# Patient Record
Sex: Female | Born: 1985 | Race: White | Hispanic: No | Marital: Single | Smoking: Current every day smoker
Health system: Southern US, Community
[De-identification: ages and names within clinical notes are randomized; demographics above are authoritative.]

## PROBLEM LIST (undated history)

## (undated) DIAGNOSIS — F1120 Opioid dependence, uncomplicated: Secondary | ICD-10-CM

## (undated) DIAGNOSIS — F1920 Other psychoactive substance dependence, uncomplicated: Secondary | ICD-10-CM

## (undated) DIAGNOSIS — K219 Gastro-esophageal reflux disease without esophagitis: Secondary | ICD-10-CM

## (undated) DIAGNOSIS — F32A Depression, unspecified: Secondary | ICD-10-CM

## (undated) DIAGNOSIS — F319 Bipolar disorder, unspecified: Secondary | ICD-10-CM

## (undated) DIAGNOSIS — I4891 Unspecified atrial fibrillation: Secondary | ICD-10-CM

## (undated) DIAGNOSIS — F329 Major depressive disorder, single episode, unspecified: Secondary | ICD-10-CM

## (undated) DIAGNOSIS — Z5320 Procedure and treatment not carried out because of patient's decision for unspecified reasons: Secondary | ICD-10-CM

## (undated) HISTORY — DX: Gastro-esophageal reflux disease without esophagitis: K21.9

## (undated) HISTORY — DX: Depression, unspecified: F32.A

## (undated) HISTORY — DX: Major depressive disorder, single episode, unspecified: F32.9

## (undated) HISTORY — DX: Unspecified atrial fibrillation: I48.91

## (undated) HISTORY — PX: OTHER SURGICAL HISTORY: SHX169

---
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
## 2006-07-19 ENCOUNTER — Emergency Department (HOSPITAL_COMMUNITY): Admission: EM | Admit: 2006-07-19 | Discharge: 2006-07-19 | Payer: Self-pay | Admitting: Emergency Medicine

## 2008-02-14 ENCOUNTER — Emergency Department (HOSPITAL_COMMUNITY): Admission: EM | Admit: 2008-02-14 | Discharge: 2008-02-14 | Payer: Self-pay | Admitting: Emergency Medicine

## 2008-03-12 ENCOUNTER — Ambulatory Visit: Payer: Self-pay | Admitting: Internal Medicine

## 2008-03-12 DIAGNOSIS — J45909 Unspecified asthma, uncomplicated: Secondary | ICD-10-CM

## 2008-03-12 DIAGNOSIS — R059 Cough, unspecified: Secondary | ICD-10-CM | POA: Insufficient documentation

## 2008-03-12 DIAGNOSIS — R05 Cough: Secondary | ICD-10-CM | POA: Insufficient documentation

## 2008-03-25 ENCOUNTER — Ambulatory Visit: Payer: Self-pay | Admitting: Cardiovascular Disease

## 2008-03-29 ENCOUNTER — Ambulatory Visit: Payer: Self-pay | Admitting: Internal Medicine

## 2008-03-29 ENCOUNTER — Encounter: Payer: Self-pay | Admitting: Internal Medicine

## 2008-03-29 LAB — CONVERTED CEMR LAB
BUN: 12 mg/dL (ref 6–23)
Basophils Absolute: 0.1 10*3/uL (ref 0.0–0.1)
Calcium: 8.9 mg/dL (ref 8.4–10.5)
Chloride: 108 meq/L (ref 96–112)
Eosinophils Absolute: 0.4 10*3/uL (ref 0.0–0.7)
GFR calc Af Amer: 116 mL/min
GFR calc non Af Amer: 96 mL/min
Hemoglobin: 14.4 g/dL (ref 12.0–15.0)
Magnesium: 2 mg/dL (ref 1.5–2.5)
Monocytes Absolute: 1.1 10*3/uL — ABNORMAL HIGH (ref 0.1–1.0)
Neutrophils Relative %: 62.9 % (ref 43.0–77.0)
Potassium: 3.9 meq/L (ref 3.5–5.1)
RDW: 12.7 % (ref 11.5–14.6)
Sodium: 142 meq/L (ref 135–145)

## 2008-03-30 ENCOUNTER — Ambulatory Visit: Payer: Self-pay

## 2008-04-06 ENCOUNTER — Ambulatory Visit: Payer: Self-pay | Admitting: Cardiovascular Disease

## 2008-04-06 ENCOUNTER — Inpatient Hospital Stay (HOSPITAL_COMMUNITY): Admission: EM | Admit: 2008-04-06 | Discharge: 2008-04-07 | Payer: Self-pay | Admitting: Emergency Medicine

## 2008-04-18 ENCOUNTER — Ambulatory Visit: Payer: Self-pay | Admitting: *Deleted

## 2008-04-18 ENCOUNTER — Inpatient Hospital Stay (HOSPITAL_COMMUNITY): Admission: EM | Admit: 2008-04-18 | Discharge: 2008-04-20 | Payer: Self-pay | Admitting: *Deleted

## 2008-04-26 ENCOUNTER — Ambulatory Visit: Payer: Self-pay | Admitting: Internal Medicine

## 2008-05-17 DIAGNOSIS — I429 Cardiomyopathy, unspecified: Secondary | ICD-10-CM | POA: Insufficient documentation

## 2008-05-17 DIAGNOSIS — I4891 Unspecified atrial fibrillation: Secondary | ICD-10-CM

## 2008-10-17 ENCOUNTER — Encounter: Payer: Self-pay | Admitting: Internal Medicine

## 2008-10-18 ENCOUNTER — Encounter: Payer: Self-pay | Admitting: Internal Medicine

## 2008-11-05 ENCOUNTER — Emergency Department (HOSPITAL_COMMUNITY): Admission: EM | Admit: 2008-11-05 | Discharge: 2008-11-05 | Payer: Self-pay | Admitting: Family Medicine

## 2008-11-26 ENCOUNTER — Emergency Department (HOSPITAL_COMMUNITY): Admission: EM | Admit: 2008-11-26 | Discharge: 2008-11-26 | Payer: Self-pay | Admitting: Emergency Medicine

## 2008-11-30 ENCOUNTER — Ambulatory Visit: Payer: Self-pay | Admitting: Internal Medicine

## 2008-11-30 ENCOUNTER — Emergency Department (HOSPITAL_COMMUNITY): Admission: EM | Admit: 2008-11-30 | Discharge: 2008-11-30 | Payer: Self-pay | Admitting: Emergency Medicine

## 2008-11-30 ENCOUNTER — Ambulatory Visit: Payer: Self-pay | Admitting: Cardiology

## 2008-11-30 DIAGNOSIS — R55 Syncope and collapse: Secondary | ICD-10-CM | POA: Insufficient documentation

## 2008-12-01 ENCOUNTER — Ambulatory Visit: Payer: Self-pay | Admitting: Internal Medicine

## 2008-12-01 ENCOUNTER — Inpatient Hospital Stay (HOSPITAL_COMMUNITY): Admission: AD | Admit: 2008-12-01 | Discharge: 2008-12-04 | Payer: Self-pay | Admitting: Internal Medicine

## 2008-12-02 ENCOUNTER — Encounter: Payer: Self-pay | Admitting: Internal Medicine

## 2008-12-04 ENCOUNTER — Telehealth: Payer: Self-pay | Admitting: Nurse Practitioner

## 2008-12-04 ENCOUNTER — Emergency Department (HOSPITAL_COMMUNITY): Admission: EM | Admit: 2008-12-04 | Discharge: 2008-12-05 | Payer: Self-pay | Admitting: Emergency Medicine

## 2008-12-09 ENCOUNTER — Encounter: Payer: Self-pay | Admitting: Internal Medicine

## 2008-12-12 ENCOUNTER — Emergency Department: Payer: Self-pay | Admitting: Internal Medicine

## 2008-12-22 ENCOUNTER — Ambulatory Visit: Payer: Self-pay | Admitting: Cardiology

## 2008-12-22 ENCOUNTER — Encounter (INDEPENDENT_AMBULATORY_CARE_PROVIDER_SITE_OTHER): Payer: Self-pay | Admitting: *Deleted

## 2008-12-22 ENCOUNTER — Telehealth (INDEPENDENT_AMBULATORY_CARE_PROVIDER_SITE_OTHER): Payer: Self-pay | Admitting: *Deleted

## 2009-01-01 ENCOUNTER — Inpatient Hospital Stay (HOSPITAL_COMMUNITY): Admission: AD | Admit: 2009-01-01 | Discharge: 2009-01-02 | Payer: Self-pay | Admitting: Obstetrics & Gynecology

## 2009-01-03 ENCOUNTER — Emergency Department: Payer: Self-pay | Admitting: Emergency Medicine

## 2009-01-06 ENCOUNTER — Telehealth: Payer: Self-pay | Admitting: Cardiology

## 2009-01-06 ENCOUNTER — Encounter: Payer: Self-pay | Admitting: Internal Medicine

## 2009-01-06 ENCOUNTER — Telehealth: Payer: Self-pay | Admitting: Internal Medicine

## 2009-01-08 ENCOUNTER — Emergency Department: Payer: Self-pay | Admitting: Emergency Medicine

## 2009-01-13 ENCOUNTER — Telehealth: Payer: Self-pay | Admitting: Internal Medicine

## 2009-01-18 ENCOUNTER — Ambulatory Visit: Payer: Self-pay | Admitting: Internal Medicine

## 2009-01-19 ENCOUNTER — Telehealth (INDEPENDENT_AMBULATORY_CARE_PROVIDER_SITE_OTHER): Payer: Self-pay | Admitting: *Deleted

## 2009-01-19 LAB — CONVERTED CEMR LAB: Digitoxin Lvl: 0.4 ng/mL — ABNORMAL LOW (ref 0.8–2.0)

## 2009-03-02 ENCOUNTER — Encounter (INDEPENDENT_AMBULATORY_CARE_PROVIDER_SITE_OTHER): Payer: Self-pay | Admitting: *Deleted

## 2009-04-06 ENCOUNTER — Inpatient Hospital Stay (HOSPITAL_COMMUNITY): Admission: AD | Admit: 2009-04-06 | Discharge: 2009-04-06 | Payer: Self-pay | Admitting: Obstetrics & Gynecology

## 2009-05-04 ENCOUNTER — Encounter (INDEPENDENT_AMBULATORY_CARE_PROVIDER_SITE_OTHER): Payer: Self-pay | Admitting: *Deleted

## 2009-05-18 IMAGING — CR DG CHEST 1V PORT
1 series · 1 of 1 positions shown · non-contrast
Comparison: 03/12/2008

CLINICAL DATA: Chest pain

PORTABLE CHEST - 1 VIEW

[view not recorded]
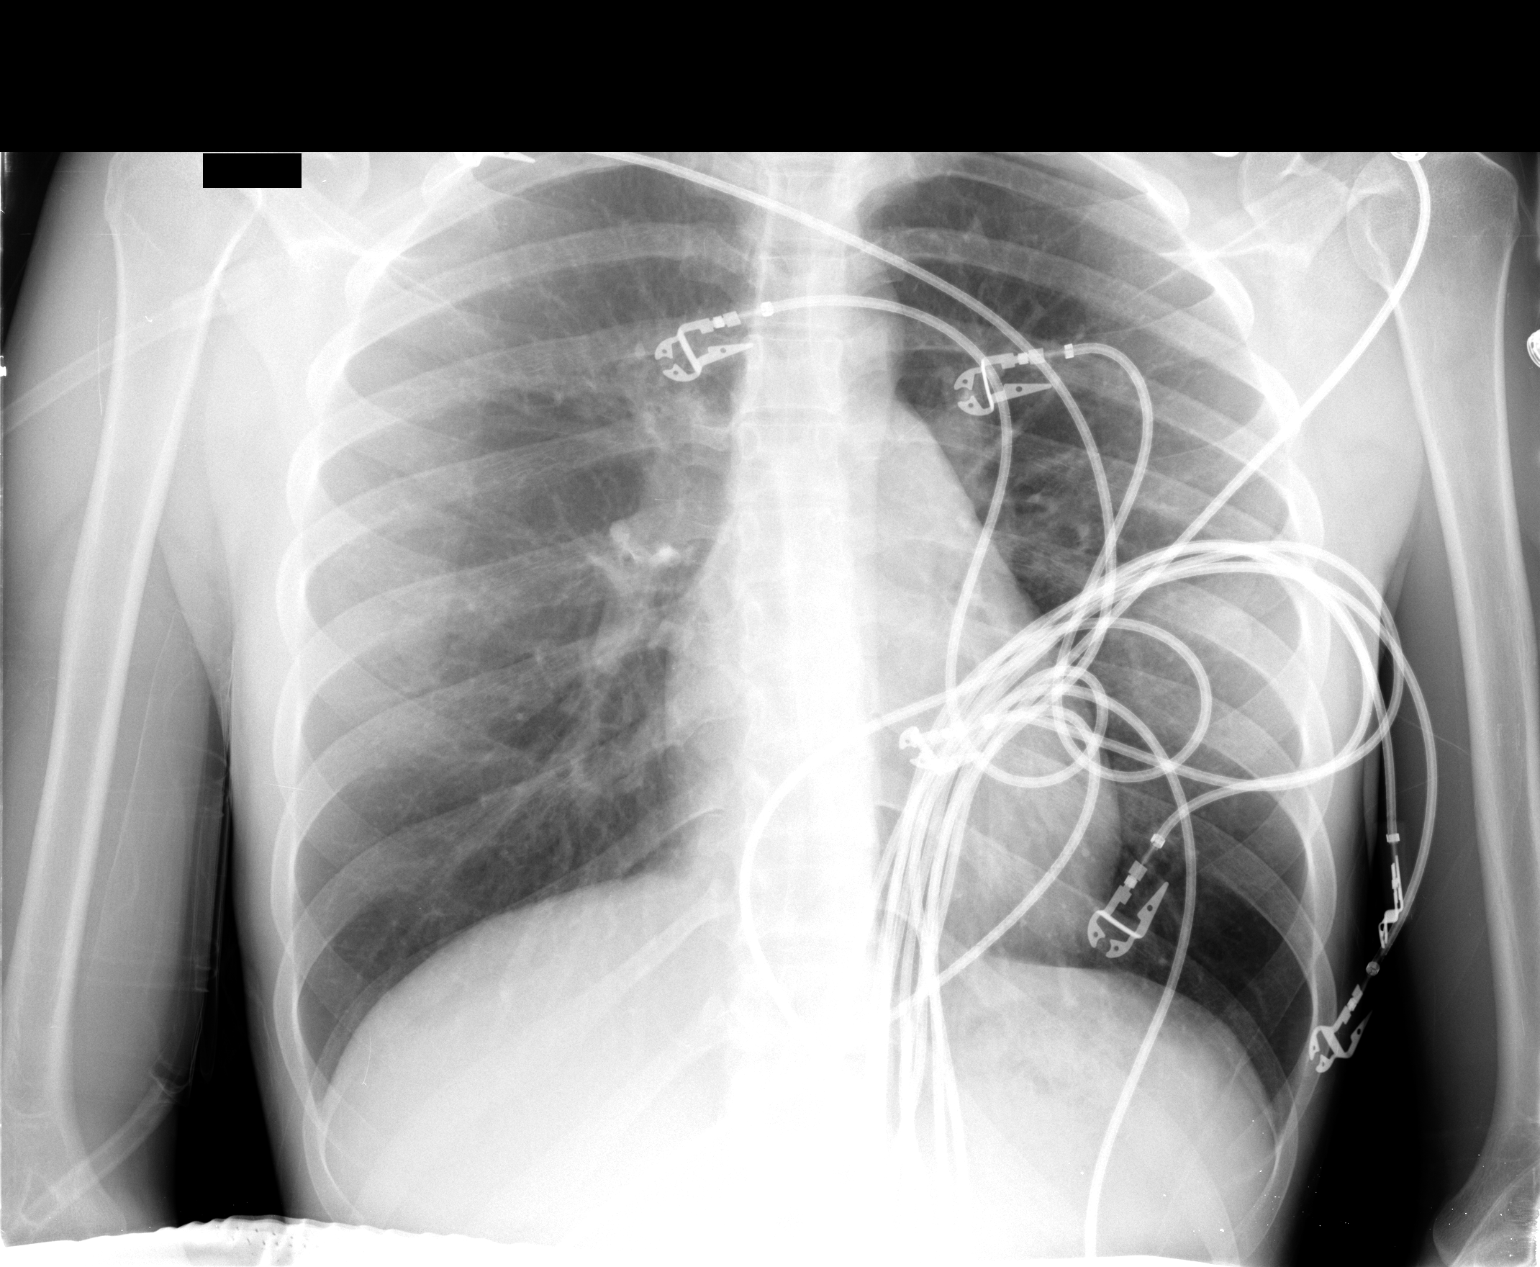

[1 of 1 positions shown; findings below may reference images not displayed]

FINDINGS: Heart and mediastinal contours normal.  There is
peribronchial thickening and hyperaeration of the lungs as before.
No focal airspace disease or pleural fluid in one-view.
IMPRESSION: Peribronchial thickening and hyperaeration - no evidence for
pneumonia or interval change.

## 2009-07-20 ENCOUNTER — Ambulatory Visit: Payer: Self-pay | Admitting: Cardiovascular Disease

## 2009-07-20 DIAGNOSIS — I519 Heart disease, unspecified: Secondary | ICD-10-CM | POA: Insufficient documentation

## 2009-07-21 LAB — CONVERTED CEMR LAB
Basophils Relative: 1 % (ref 0–1)
Eosinophils Absolute: 0.3 10*3/uL (ref 0.0–0.7)
HCT: 40.7 % (ref 36.0–46.0)
Lymphocytes Relative: 33 % (ref 12–46)
Monocytes Absolute: 0.7 10*3/uL (ref 0.1–1.0)
Neutro Abs: 4 10*3/uL (ref 1.7–7.7)
Preg, Serum: NEGATIVE
RBC: 4.65 M/uL (ref 3.87–5.11)
WBC: 7.6 10*3/uL (ref 4.0–10.5)

## 2009-07-22 ENCOUNTER — Encounter: Payer: Self-pay | Admitting: Cardiovascular Disease

## 2009-07-27 ENCOUNTER — Encounter: Payer: Self-pay | Admitting: Cardiovascular Disease

## 2009-07-27 ENCOUNTER — Telehealth: Payer: Self-pay | Admitting: Cardiovascular Disease

## 2009-09-25 ENCOUNTER — Emergency Department (HOSPITAL_COMMUNITY): Admission: EM | Admit: 2009-09-25 | Discharge: 2009-09-25 | Payer: Self-pay | Admitting: Emergency Medicine

## 2009-09-30 ENCOUNTER — Emergency Department (HOSPITAL_COMMUNITY): Admission: EM | Admit: 2009-09-30 | Discharge: 2009-09-30 | Payer: Self-pay | Admitting: Emergency Medicine

## 2010-01-23 IMAGING — US US OB US >=[ID] SNGL FETUS
1 series · 17 of 28 positions shown · non-contrast
Comparison: none

REASON FOR EXAM: Pain
COMMENTS:

PROCEDURE:     US  - US OB GREATER/OR EQUAL TO [id number]  - December 12, 2008  [DATE]
RESULT:     Comparison: None
INDICATION: Pain. LMP 08/24/2008
TECHNIQUE: Multiple transabdominal gray-scale images of the pelvis
performed.

[Series 1: us ob us >=(id) sngl fetus · 17 of 41 slices shown]
[im 1/41]
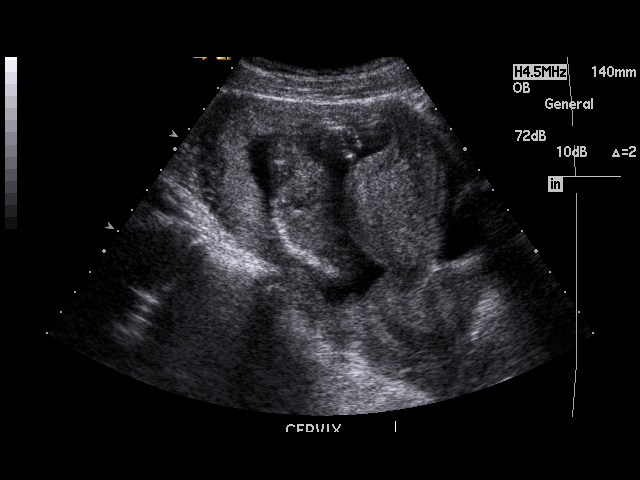
[im 3/41]
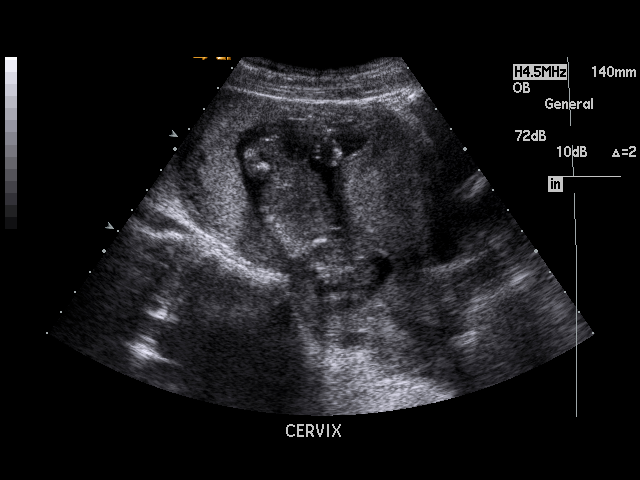
[im 6/41]
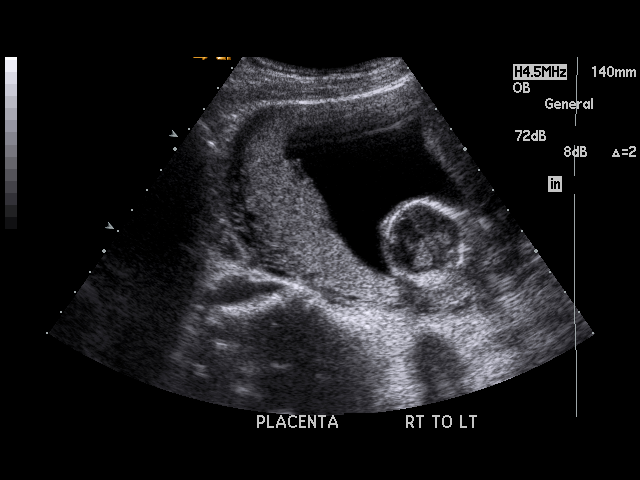
[im 8/41]
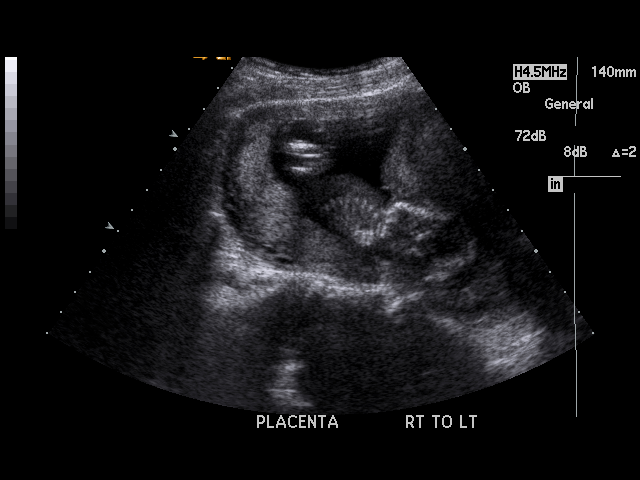
[im 11/41]
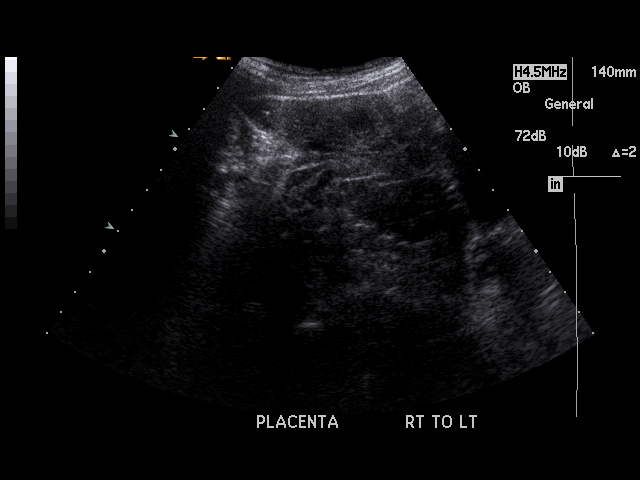
[im 14/41]
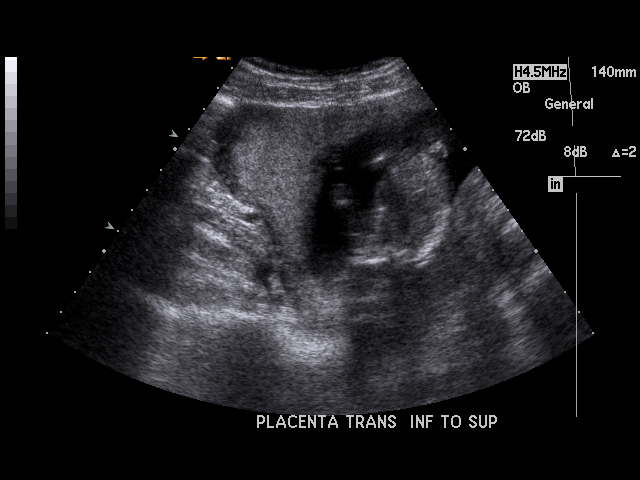
[im 15/41]
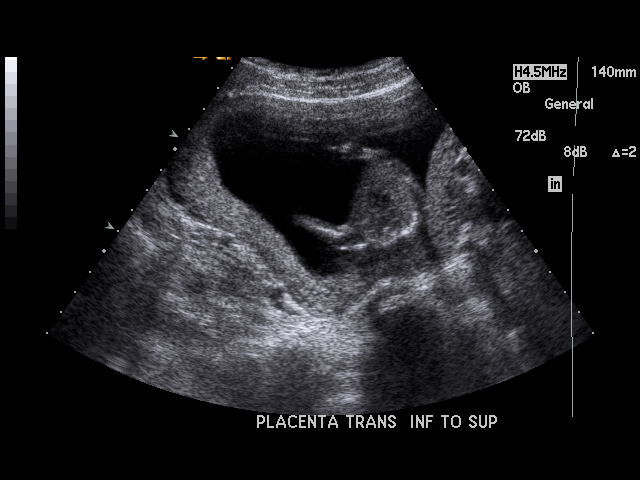
[im 18/41]
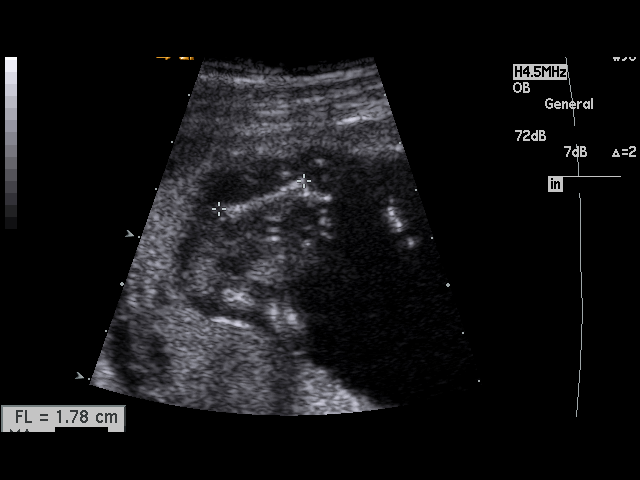
[im 21/41]
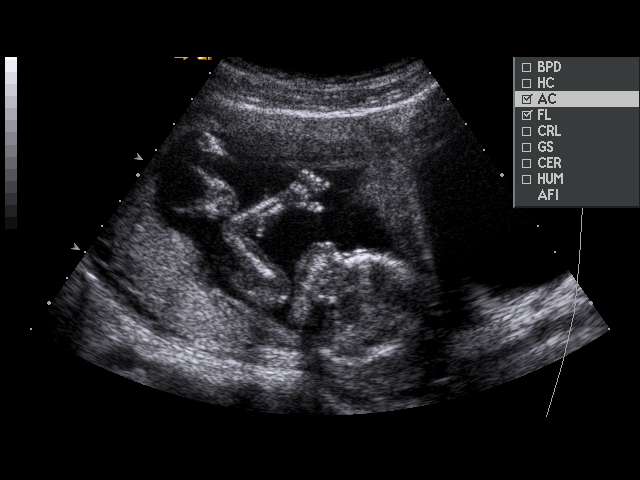
[im 23/41]
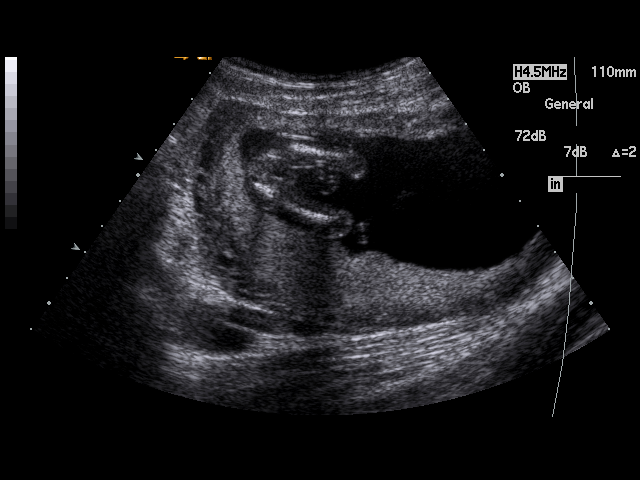
[im 26/41]
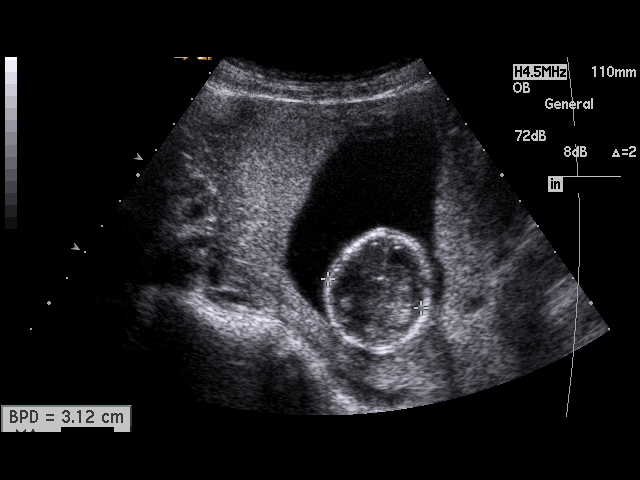
[im 27/41]
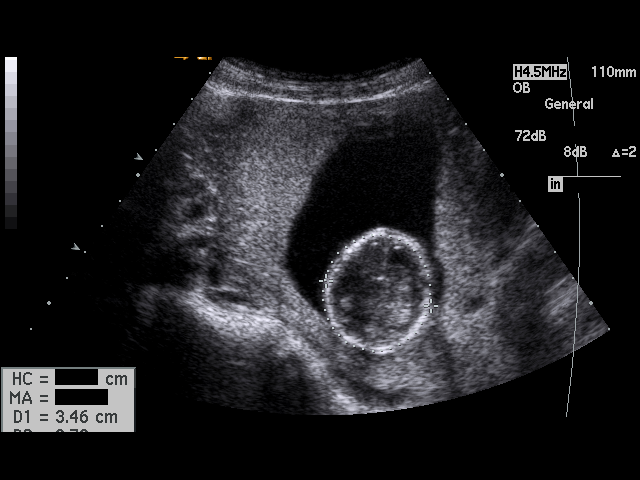
[im 30/41]
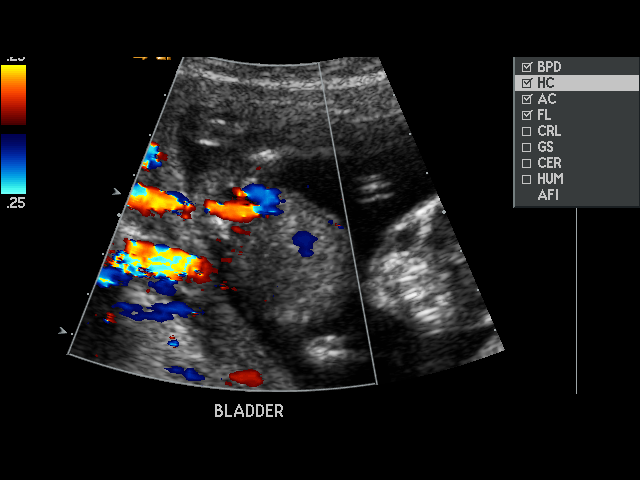
[im 33/41]
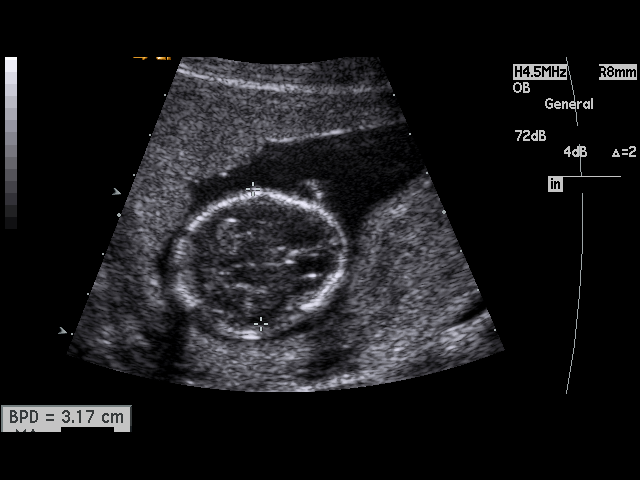
[im 35/41]
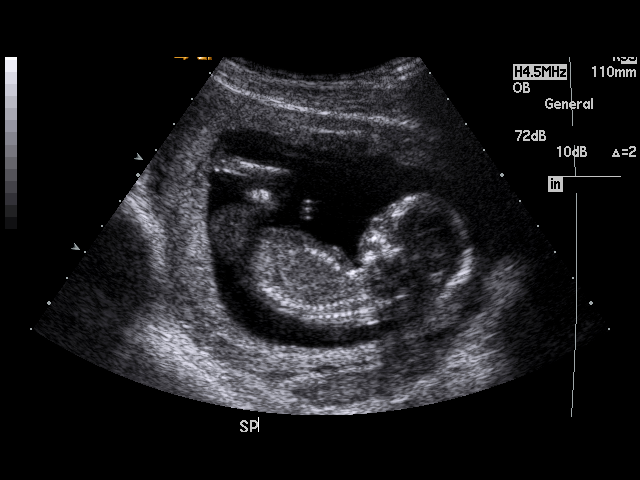
[im 38/41]
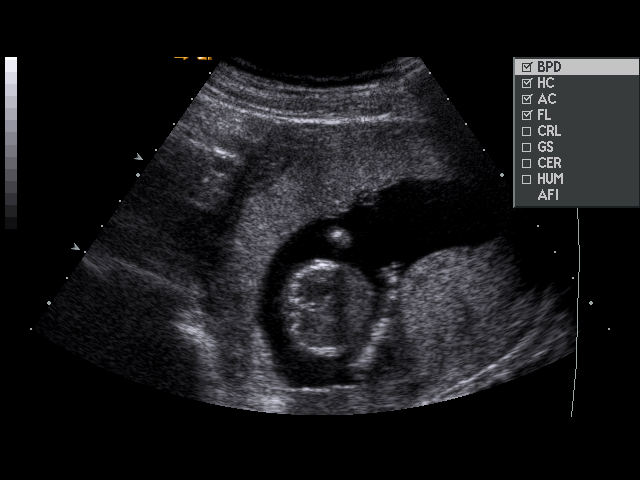
[im 41/41]
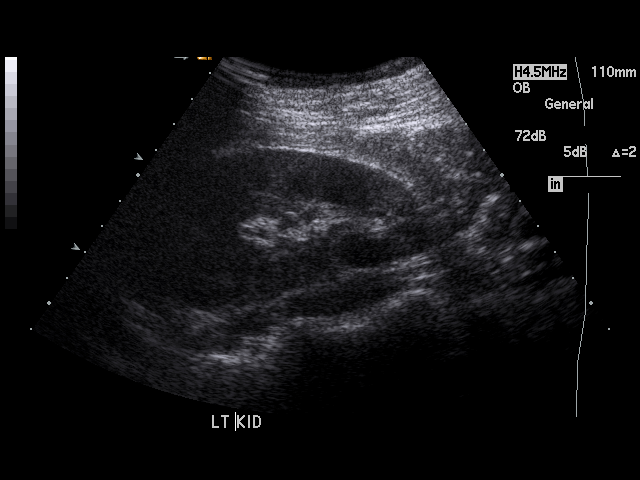

[17 of 28 positions shown; findings below may reference images not displayed]

FINDINGS: There is a single live intrauterine pregnancy dating 15 weeks 5 days with an
estimated due date of 05/31/2009. There is a normal fetal heart rate of 171
beats per minute. The placenta is posteriorly located and normal. The
placenta is grade 0. There is no placental abruption. The cervix is closed
and normal in length measuring 3.9 cm.

There is no adnexal mass. The right ovary is not visualized.

There is no pelvic free fluid.
IMPRESSION: Single live intrauterine pregnancy dating 15 weeks 5 days with a normal
fetal heart rate of 171 beats per minute.

## 2010-03-31 ENCOUNTER — Telehealth: Payer: Self-pay | Admitting: Internal Medicine

## 2010-04-03 ENCOUNTER — Telehealth: Payer: Self-pay | Admitting: Internal Medicine

## 2010-04-05 ENCOUNTER — Telehealth: Payer: Self-pay | Admitting: Internal Medicine

## 2010-04-25 ENCOUNTER — Telehealth (INDEPENDENT_AMBULATORY_CARE_PROVIDER_SITE_OTHER): Payer: Self-pay | Admitting: *Deleted

## 2010-06-20 ENCOUNTER — Ambulatory Visit: Admit: 2010-06-20 | Payer: Self-pay | Admitting: Internal Medicine

## 2010-07-06 NOTE — Letter (Signed)
Summary: Clearance Letter  Architectural technologist at [address]   [place], [place] [address]   Phone: [phone]  Fax: [phone]    July 22, 2009  Re:     [name] Address:   [address]     [place], [place]  [address] DOB:     [birth year] MRN:     [id number]   Dear Ms. Pumphrey-  You have been cleared from a cardiac standpoint to proceed with surgery.  You are scheduled to have a repeat EKG on 07/27/2009 to follow medication changes made at your last office visit.  Should you have any questions or need additional information, please feel free to contact our office at [phone].     Sincerely,     Dossie Arbour, MD, PhD Charlena Cross, RN, BSN

## 2010-07-06 NOTE — Progress Notes (Signed)
Summary: pt needs refill  Phone Note Refill Request Call back at [phone] Message from:  Patient on [facility] / [phone]  Refills Requested: Medication #1:  SOTALOL HCL 120 MG TABS 1/2  tablet by mouth twice a day Initial call taken by: Omer Jack,  March 31, 2010 3:59 PM  Follow-up for Phone Call        Called pharmacy and authorized refill for Sotalol in [facility].  Left msg on pts vcml to let her know it has been called in Follow-up by: Marrion Coy, CNA,  March 31, 2010 4:03 PM

## 2010-07-06 NOTE — Progress Notes (Signed)
  Phone Note Other Incoming   Request: Send information Summary of Call: Request received from [facility] forwarded to [facility].

## 2010-07-06 NOTE — Progress Notes (Signed)
Summary: SURGICAL CLEARANCE  Phone Note Call from Patient Call back at Home Phone [phone]   Caller: SELF Call For: [facility] Summary of Call: NEEDS A LETTER OF SURGICAL CLEARANCE FAXED TO # [phone] STATING THAT THE RETURN EKG WAS NOT RELATED TO HER SURGICAL CLEARANCE, THAT IT WAS ONLY RELATED TO HER MEDICATION CHANGE  Initial call taken by: Harlon Flor,  July 27, 2009 2:50 PM  Follow-up for Phone Call        done.  Follow-up by: Charlena Cross, RN, BSN,  July 27, 2009 4:30 PM

## 2010-07-06 NOTE — Progress Notes (Signed)
Summary: refill request  Phone Note Refill Request Message from:  Patient on April 03, 2010 11:16 AM  Refills Requested: Medication #1:  METOPROLOL TARTRATE 25 MG TABS 1/2  tablet by mouth twice a day [facility] [phone] [place]   Method Requested: Telephone to Pharmacy Initial call taken by: Glynda Jaeger,  April 03, 2010 11:17 AM  Follow-up for Phone Call        Medication has been called in but pt profile at pharmacy is showing a different formulation of Metoprolol written by Dr. August Saucer at [facility].  Informed pharmacy I could only authorize what we discharged pt on in FEB and if she has a more recent Rx she wll need to contact Dr. August Saucer.   Follow-up by: Judithe Modest CMA,  April 03, 2010 11:48 AM

## 2010-07-06 NOTE — Letter (Signed)
Summary: Clearance Letter  Architectural technologist at [address]   [place], [place] [address]   Phone: [phone]  Fax: [phone]    July 27, 2009  Re:     [name] Address:   [id number]     [place], [place]  [address] DOB:     06-24-1985 MRN:     [id number]   Dear Dr. Melvenia Beam-  [name] is clear from a cardiology standpoint to proceed with surgery without a follow-up EKG.  The EKG was requested in relation to medication changes that were made at her last office visit.  Should you need additional information or assistance in this matter, please feel free to contact our office @ [phone].   Sincerely,     Dossie Arbour, MD, PhD

## 2010-07-06 NOTE — Progress Notes (Signed)
Summary: pt has medication question  Phone Note Call from Patient Call back at [phone]   Caller: Patient Reason for Call: Talk to Nurse, Talk to Doctor Summary of Call: pt has a question regarding her medication she is on SOTALOL HCL 120 MG TABS 1/2  tablet by mouth twice a day and METOPROLOL TARTRATE 25 MG TABS 1/2  tablet by mouth twice a day and Pharm told her she should not be taking both meds so she wants to discuss this with someone. Initial call taken by: Omer Jack,  April 05, 2010 11:45 AM  Follow-up for Phone Call        spoke w/ pt and found that she was going to an [facility] [address] while pregnant and they had prescribed Metoprolol 50mg  during her pregnancy.  she turned in that bottle to [facility] in [facility] and the 2 different mg's caused some confusion, not the sotalol and metoprolol combination. Adv pt also that she needs to find a cardiologist in [facility] to be checked out as the last visit showed a HR in the 40's. She expressed understanding.  Follow-up by: Claris Gladden RN,  April 05, 2010 2:57 PM

## 2010-07-06 NOTE — Assessment & Plan Note (Signed)
Summary: ec6   Visit Type:  ec6 Referring Provider:  Contogiannis Primary Provider:  None  CC:  medical clearance for breast augmentation, no cp, some acid reflux, no sob, and no edema.  History of Present Illness: [name] is a pleasant 25 year old woman with a history of atrophic relation, asthma, recent pregnancy and delivery of a premature child at 11 weeks who presents for routine followup and preoperative evaluation prior to breast augmentation surgery.  [name] states that she's had no episodes of atrial fibrillation over the course of the past year. She had a difficult first trimester with atrial fibrillation. She blames it on the stress and nausea vomiting. She has felt very fatigued on her current medication regiment and in fact has cut her sotalol and has had her metoprolol in half. She continues on her digoxin daily. She states that she did see a physician in [facility] and her heart rate was typically in the 30s and no adjustments to her medications were made.  She has been very active taking care of her children but no significant symptoms apart from her fatigue. No syncope she does have episodes of dizziness if she sits up too quickly..  Preventive Screening-Counseling & Management  Alcohol-Tobacco     Alcohol drinks/day: <1     Smoking Status: current     Packs/Day: 4 cig  Caffeine-Diet-Exercise     Caffeine use/day: no     Does Patient Exercise: no   Current Problems (verified): 1)  Syncope  (ICD-780.2) 2)  Intrauterine Pregnancy  (ICD-V22.2) 3)  Cardiomyopathy, Secondary  (ICD-425.9) 4)  Atrial Fibrillation  (ICD-427.31) 5)  Cough  (ICD-786.2) 6)  Asthma  (ICD-493.90)  Current Medications (verified): 1)  Digoxin 0.25 Mg Tabs (Digoxin) .... Take One Tablet By Mouth Daily 2)  Sotalol Hcl 120 Mg Tabs (Sotalol Hcl) .... 1/2  Tablet By Mouth Twice A Day 3)  Metoprolol Tartrate 25 Mg Tabs (Metoprolol Tartrate) .... 1/2  Tablet By Mouth Twice A Day 4)  Lorazepam  1 Mg Tabs (Lorazepam) .... As Needed  Allergies (verified): No Known Drug Allergies  Past History:  Past Medical History: Last updated: 05/17/2008 Asthma Atrial Fibrillation G E R D LV function lower limit of normal Depression  Family History: Last updated: 03/12/2008 Brother has asthma  Social History: Last updated: 03/12/2008 Current smoker since 2002.  Smokes about 8 cigs per day ETOH on wkends Single Children [employer]  Risk Factors: Alcohol Use: <1 (07/20/2009) Caffeine Use: no (07/20/2009) Exercise: no  (07/20/2009)  Risk Factors: Smoking Status: current (07/20/2009) Packs/Day: 4 cig (07/20/2009)  Social History: Alcohol drinks/day:  <1 Smoking Status:  current Packs/Day:  4 cig Caffeine use/day:  no Does Patient Exercise:  no   Vital Signs:  Patient profile:   25 year old female Height:      67 inches Weight:      110.75 pounds BMI:     17.41 Pulse rate:   47 / minute Pulse rhythm:   regular BP sitting:   102 / 68  (left arm) Cuff size:   regular  Vitals Entered By: Mercer Pod (July 20, 2009 3:51 PM)  Physical Exam  General:  well-appearing young woman in no apparent distress, alert oriented x3, HEENT exam is benign oropharynx is clear, neck is supple with no JVP or carotid bruits, heart sounds are regular with normal S1-S2 no murmurs appreciated, lungs are clear to auscultation with no wheezes Rales, abdominal exam is benign, she is no significant  lower extremity edema, neurologic exam is grossly nonfocal, skin is warm and dry. Pulses are equal and symmetrical in her upper and lower extremities.   EKG  Procedure date:  07/20/2009  Findings:      Normal sinus rhythm with rate of 47 beats per minute, no significant ST or T wave changes. PAC noted.  Impression & Recommendations:  Problem # 1:  ATRIAL FIBRILLATION (ICD-427.31) no episodes which fibrillation over the course of the past year. None since her first trimester  of her pregnancy. Her heart rate is very low today at 47 beats per minute. We have stressed that she hold her metoprolol 12.5 mg b.i.d. If her heart rate continues to be low on Sunday 5 days time, she can hold her digoxin and continue on her half dose of sotalol. We will check an EKG in one week's time. Her updated medication list for this problem includes:    Digoxin 0.25 Mg Tabs (Digoxin) .Marland Kitchen... Take one tablet by mouth daily    Sotalol Hcl 120 Mg Tabs (Sotalol hcl) .Marland Kitchen... 1/2  tablet by mouth twice a day    Metoprolol Tartrate 25 Mg Tabs (Metoprolol tartrate) .Marland Kitchen... 1/2  tablet by mouth twice a day  Problem # 2:  PRE-OPERATIVE CARDIAC EXAM (ICD-V72.81) she would be low risk for breast augmentation surgery. Her heart rate is low we will try to decrease some of her medications with a goal heart rate in the high 50s low 60s. We may have to stop her digoxin as well as metoprolol if her heart rate does not improve. patient has stated that she needs a CBC and a serum pregnancy prior to her surgery we will perform this in the clinic today. Her updated medication list for this problem includes:    Sotalol Hcl 120 Mg Tabs (Sotalol hcl) .Marland Kitchen... 1/2  tablet by mouth twice a day    Metoprolol Tartrate 25 Mg Tabs (Metoprolol tartrate) .Marland Kitchen... 1/2  tablet by mouth twice a day  Other Orders: T-CBC w/Diff ([id number]) T-Pregnancy (Serum), Quant. [phone])  New Orders:     1)  T-CBC w/Diff ([id number])     2)  T-Pregnancy (Serum), Quant. [phone])   Patient Instructions: 1)  Your physician recommends that you schedule a follow-up appointment in: 1 week for EKG

## 2010-08-22 LAB — BASIC METABOLIC PANEL
Calcium: 8.7 mg/dL (ref 8.4–10.5)
GFR calc non Af Amer: >60 mL/min (ref >60)
Glucose, Bld: 87 mg/dL (ref 70–99)
Potassium: 3.7 mEq/L (ref 3.5–5.1)

## 2010-08-22 LAB — CBC
Hemoglobin: 13.8 g/dL (ref 12.0–15.0)
MCV: 90.4 fL (ref 78.0–100.0)
RBC: 4.35 MIL/uL (ref 3.87–5.11)

## 2010-08-22 LAB — POCT I-STAT, CHEM 8
Glucose, Bld: 86 mg/dL (ref 70–99)
Hemoglobin: 13.9 g/dL (ref 12.0–15.0)
Potassium: 3.8 mEq/L (ref 3.5–5.1)
Sodium: 140 mEq/L (ref 135–145)
TCO2: 21 mmol/L (ref 0–100)

## 2010-08-22 LAB — DIGOXIN LEVEL: Digoxin Level: 1 ng/mL (ref 0.8–2.0)

## 2010-08-22 LAB — ETHANOL: Alcohol, Ethyl (B): 149 mg/dL — ABNORMAL HIGH (ref 0–10)

## 2010-08-22 LAB — DIFFERENTIAL: Neutro Abs: 6.9 10*3/uL (ref 1.7–7.7)

## 2010-08-22 LAB — RAPID STREP SCREEN (MED CTR MEBANE ONLY): Streptococcus, Group A Screen (Direct): NEGATIVE

## 2010-09-06 LAB — RAPID URINE DRUG SCREEN, HOSP PERFORMED
Amphetamines: NOT DETECTED
Benzodiazepines: NOT DETECTED
Opiates: NOT DETECTED

## 2010-09-06 LAB — URINE CULTURE: Colony Count: >=100000

## 2010-09-06 LAB — URINALYSIS, ROUTINE W REFLEX MICROSCOPIC
Bilirubin Urine: NEGATIVE
Glucose, UA: NEGATIVE mg/dL
Ketones, ur: NEGATIVE mg/dL
Specific Gravity, Urine: 1.01 (ref 1.005–1.030)
Urobilinogen, UA: 0.2 mg/dL (ref 0.0–1.0)
pH: 7 (ref 5.0–8.0)

## 2010-09-06 LAB — URINE MICROSCOPIC-ADD ON

## 2010-09-06 LAB — STREP B DNA PROBE: Strep Group B Ag: NEGATIVE

## 2010-09-06 LAB — FETAL FIBRONECTIN: Fetal Fibronectin: POSITIVE

## 2010-09-06 LAB — WET PREP, GENITAL

## 2010-09-10 LAB — BASIC METABOLIC PANEL
Chloride: 105 mEq/L (ref 96–112)
Chloride: 107 mEq/L (ref 96–112)
GFR calc Af Amer: >60 mL/min (ref >60)
GFR calc Af Amer: >60 mL/min (ref >60)
GFR calc non Af Amer: >60 mL/min (ref >60)
Potassium: 4 mEq/L (ref 3.5–5.1)
Sodium: 136 mEq/L (ref 135–145)
Sodium: 136 mEq/L (ref 135–145)

## 2010-09-10 LAB — MAGNESIUM: Magnesium: 1.5 mg/dL (ref 1.5–2.5)

## 2010-09-11 LAB — DIFFERENTIAL
Basophils Relative: 0 % (ref 0–1)
Eosinophils Absolute: 0.1 10*3/uL (ref 0.0–0.7)
Eosinophils Absolute: 0.1 10*3/uL (ref 0.0–0.7)
Eosinophils Relative: 1 % (ref 0–5)
Lymphs Abs: 1.5 10*3/uL (ref 0.7–4.0)
Lymphs Abs: 1.8 10*3/uL (ref 0.7–4.0)
Monocytes Absolute: 0.8 10*3/uL (ref 0.1–1.0)
Monocytes Relative: 6 % (ref 3–12)
Monocytes Relative: 7 % (ref 3–12)
Neutrophils Relative %: 72 % (ref 43–77)
Neutrophils Relative %: 80 % — ABNORMAL HIGH (ref 43–77)

## 2010-09-11 LAB — CBC
HCT: 31.9 % — ABNORMAL LOW (ref 36.0–46.0)
HCT: 34.9 % — ABNORMAL LOW (ref 36.0–46.0)
HCT: 35.7 % — ABNORMAL LOW (ref 36.0–46.0)
Hemoglobin: 12.3 g/dL (ref 12.0–15.0)
MCHC: 34.1 g/dL (ref 30.0–36.0)
MCV: 92 fL (ref 78.0–100.0)
MCV: 92.3 fL (ref 78.0–100.0)
MCV: 92.7 fL (ref 78.0–100.0)
Platelets: 190 10*3/uL (ref 150–400)
Platelets: 210 10*3/uL (ref 150–400)
RBC: 3.44 MIL/uL — ABNORMAL LOW (ref 3.87–5.11)
RBC: 3.8 MIL/uL — ABNORMAL LOW (ref 3.87–5.11)
RBC: 3.87 MIL/uL (ref 3.87–5.11)
WBC: 10.3 10*3/uL (ref 4.0–10.5)
WBC: 11.8 10*3/uL — ABNORMAL HIGH (ref 4.0–10.5)
WBC: 9.5 10*3/uL (ref 4.0–10.5)

## 2010-09-11 LAB — BASIC METABOLIC PANEL
BUN: 5 mg/dL — ABNORMAL LOW (ref 6–23)
CO2: 24 mEq/L (ref 19–32)
CO2: 26 mEq/L (ref 19–32)
Chloride: 107 mEq/L (ref 96–112)
Chloride: 107 mEq/L (ref 96–112)
GFR calc Af Amer: >60 mL/min (ref >60)
Potassium: 3.8 mEq/L (ref 3.5–5.1)
Potassium: 4.3 mEq/L (ref 3.5–5.1)
Sodium: 137 mEq/L (ref 135–145)

## 2010-09-11 LAB — URINALYSIS, ROUTINE W REFLEX MICROSCOPIC
Glucose, UA: NEGATIVE mg/dL
Glucose, UA: NEGATIVE mg/dL
Hgb urine dipstick: NEGATIVE
Ketones, ur: 15 mg/dL — AB
Ketones, ur: NEGATIVE mg/dL
Nitrite: NEGATIVE
Protein, ur: NEGATIVE mg/dL
Urobilinogen, UA: 0.2 mg/dL (ref 0.0–1.0)
pH: 6.5 (ref 5.0–8.0)

## 2010-09-11 LAB — URINE MICROSCOPIC-ADD ON

## 2010-09-11 LAB — POCT PREGNANCY, URINE: Preg Test, Ur: POSITIVE

## 2010-09-11 LAB — HCG, QUANTITATIVE, PREGNANCY: hCG, Beta Chain, Quant, S: 80446 m[IU]/mL — ABNORMAL HIGH (ref <5)

## 2010-10-17 NOTE — Consult Note (Signed)
NAMEMELITTA, TIGUE              ACCOUNT NO.:  [id number]   MEDICAL RECORD NO.:  [id number]          PATIENT TYPE:  INP   LOCATION:  3732                         FACILITY:  [facility]   PHYSICIAN:  Kendra H. Tenny Craw, MD     DATE OF BIRTH:  1986-04-16   DATE OF CONSULTATION:  12/02/2008  DATE OF DISCHARGE:                                 CONSULTATION   CHIEF COMPLAINT:  Cramping.   HISTORY OF PRESENT ILLNESS:  Ms. Safran is a 25 year old gravida 31, para  0-1-4-1 Caucasian female who was admitted to [facility] on December 01, 2008 for cardiac arrhythmia.  Patient has a history of atrial  fibrillation, initially diagnosed in September, 2009.  Today, she noted  more crampy abdominal pain, which she describes as low, either on the  right or left side, shooting down towards her pubic bone.  She denies  bleeding.  She denies leaking of fluid or dysuria.  She does have a  history of 2 prior spontaneous abortions.  She did experience cramping  similar to this with her spontaneous abortion and became concerned.  A  transvaginal ultrasound was obtained, which demonstrated a viable  intrauterine pregnancy at 14 weeks and 4 days.  Fetal heart tones were  155.  The cervix was closed.  The adnexa were unremarkable.  The patient  also does give a history of an ultrasound that was performed in [place]  at approximately [redacted] weeks gestation at the time that she had a  cardioversion done.   PAST MEDICAL HISTORY:  Atrial fibrillation.   PAST SURGICAL HISTORY:  1. Elective pregnancy terminations x2 with D and C.  2. Cardiac ablation procedure.   PAST OBSTETRICS HISTORY:  Gravida 6, para 0-1-4-1.  She gives a history  of a 34-week spontaneous vaginal delivery in 2005.  That pregnancy was  complicated by pre-term labor.  She also gives a history of 2  spontaneous abortions, which did not require D and C, in addition to 2  first trimester pregnancy terminations.   PAST GYN HISTORY:  No abnormal Pap  smears.  She does have a remote  history of Chlamydia.   PHYSICAL EXAMINATION:  Patient is alert and oriented x3 in no apparent  distress.  ABDOMEN:  Soft, nontender, nondistended.  Gravid.   A transvaginal ultrasound demonstrates viable intrauterine pregnancy at  14 weeks and 4 days with fetal heart tones of 155.  The cervix is  closed, and the adnexa is within normal limits bilaterally.   ASSESSMENT/PLAN:  This is a 25 year old gravida 6, para 0-1-4-1 at 14  weeks and 4 days with abdominal cramping, consistent with non-labor  pain.  Patient was reassured.  Recommend daily fetal heart tones.  Given  the patient's cardiac issues, she will likely require obstetric  management at a tertiary care center but can initially be referred to  the high-risk obstetrics clinic at [facility].      Neshanic H. Tenny Craw, MD  Electronically Signed     KHR/MEDQ  D:  12/02/2008  T:  12/02/2008  Job:  [id number]
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
## 2010-10-17 NOTE — H&P (Signed)
NAMEDERRICK, [name] NO.:  [id number]   MEDICAL RECORD NO.:  [id number]          PATIENT TYPE:  INP   LOCATION:  2911                         FACILITY:  [facility]   PHYSICIAN:  Unice Cobble, MD     DATE OF BIRTH:  24-Sep-1985   DATE OF ADMISSION:  04/18/2008  DATE OF DISCHARGE:                              HISTORY & PHYSICAL   Her cardiologist is Dr. Graciela Husbands from the [facility] cardiology group.   CHIEF COMPLAINT:  Atrial fibrillation with rapid ventricular response.   HISTORY OF PRESENT ILLNESS:  This is a 25 year old white female with a  history of arrhythmia recently sent home on a heart monitor, who  presents with rapid heart rate that appears to be atrial fibrillation  with rapid ventricular response.  The patient had onset of symptoms  while reading at home with shortness of breath, mild chest tightness,  lightheadedness and palpitations.  Her symptoms did not subside after 2  hours, so she came to the emergency department.  In the emergency  department her heart rate was 149 and irregular.  She was given 25 mg of  IV diltiazem with a slowing to a heart rate less than 100 beats per  minute.  She has not had any change to her daily habits.  She has not  had any caffeine today.  She is taking her medications as prescribed.  She tells me that she broke up with her boyfriend of 2-1/2 years today  and did a lot of moving (they had been living together prior).   PAST MEDICAL HISTORY:  1. Arrhythmia, likely atrial fibrillation.  2. Questionable cardiomyopathy.  Her last echocardiogram on March 29, 2008 had an ejection fraction of 50% with normal wall motions.      Questionable tachycardia tachy cardi-mediated  3. History of bipolar/anxiety disorder:  This diagnosis is in question      now that she has been documented with an arrhythmia.  4. History of cocaine abuse in the past.   ALLERGIES:  No known drug allergies.   MEDICATIONS:  1. Coreg 3.125 mg  b.i.d.  2. Fish oil.   SOCIAL HISTORY:  She lives in [place].  Until recently she lived with  her boyfriend and sister and her 18-year-old daughter.  She broke up with  her boyfriend today and moved out.  She smokes 6-7 cigarettes per day.  Occasional alcohol.  No caffeine.  She has used cocaine in the past.   FAMILY HISTORY:  There is a brain aneurysm on her father's side.  Otherwise noncontributory.   REVIEW OF SYSTEMS:  A complete review of systems was done and found to  be otherwise negative except as stated in the HPI.   PHYSICAL EXAM:  VITAL SIGNS:  Temperature is 98.2 with a pulse 149,  respiratory rate is 20, blood pressure 90/50, O2 sats are 100% on 2  liters.  GENERAL: She is in no acute distress.  Thin.  HEENT:  PERRLA, EOMI, MMM.  Oropharynx is without erythema or exudates.  NECK: Supple without lymphadenopathy, thyromegaly, bruits, or jugular  venous distention.  HEART:  Has an irregularly irregular rhythm.  No murmurs, gallops or  rubs.  Normal PMI.  Pulses 2+ and equal bilaterally without bruits.  LUNGS: Clear to auscultation bilaterally without wheezes, rhonchi or  rales.  SKIN:  No rashes or lesions.  She has several tattoos.  ABDOMEN:  Soft and nontender with normal bowel sounds.  No rebound or  guarding.  No hepatosplenomegaly.  EXTREMITIES:  Show no cyanosis, clubbing or edema.  MUSCULOSKELETAL:  Shows no joint deformity effusions or spine or CVA  tenderness.  NEUROLOGICAL:  She is alert and oriented x3 with cranial nerves II-XII  grossly intact.  Strength is 5/5 in all extremities and axial groups.  Normal sensation throughout.   EKG shows a rate of 149 in atrial fibrillation.  She has nonspecific T-  wave changes.  On the monitor after Cardizem administration she has a  rate of 90 in atrial fibrillation.   Laboratory values show a hemoglobin 13.9.  Potassium is 4 with a  creatinine of 1.  CK-MB and troponin are both negative.   ASSESSMENT/PLAN:  This  is a 25 year old white female with a history of  unknown arrhythmia presenting with what looks to be atrial fibrillation  with rapid ventricular response.  The patient does not tolerate higher  doses of Coreg due to bradycardia to the 30s.  She is doing well on her  Cardizem drip presently, and this will be continued until the morning.  Future options for this patient include pill-in-the-pocket with  propafenone or flecainide, antiarrhythmic chronically, or possibly even  eventual ablation.  This will be left to the attending's discretion.  She has a [place] score of 0 and does not warrant anticoagulation with  warfarin.  I will start a baby aspirin daily regardless.      Unice Cobble, MD  Electronically Signed     ACJ/MEDQ  D:  04/18/2008  T:  04/18/2008  Job:  [id number]

## 2010-10-17 NOTE — Letter (Signed)
March 29, 2008    Luis Abed, MD, [facility]  [address]  [address]  [place], [place] [address]   RE:  CATALEIA, GADE  MRN:  [id number]  /  DOB:  01-07-1986   Dear Trey Paula,   It was a pleasure to see Mosie Epstein at your request because of tachy  palpitations.   As you know, she is a 25 year old young lady, who was referred to you  last week from the plastic surgeons when she presented for elective  surgery and was found to be in a tachycardia.  You correctly identified  an atrial tachyarrhythmia which looks like flutter, but it is probably  fibrillation and asked [place] to see her.  She is in the same rhythm today.   She has a  history that goes back years of intermittent tachy  palpitations.  These had become increasingly frequent over time and  associated with increasing degree of symptoms of lightheadedness,  shortness of breath, and generalized fatigue.  They used to recur  yearly, then monthly, and now a couple of times a week.  She thinks that  she can tell difference between good days and bad days, although some  good days can be made bad day by overexertion suggesting that there may  be some persistence of arrhythmia with various ventricular rate.   She is a mother of a 21-year-old and her child was delivered  in hospital  and so I presume that at least on that occasion, her heart rhythms were  normal.  Also, I suspected that time her thyroid status was normal.   She uses some caffeine.  She does not use any stimulants.   Interestingly, she is also intolerant of changes in position.  She is  intolerant of hot exposure like showers and bath.  Her diet is notably  fluid deplete as well as salt deplete.   Her past medical history in addition to the above is notable for GE  reflux disease and a history of asthma for which she saw Dr. Sherene Sires.  I  should note at that visit, on March 12, 2008, her heart rate was  recorded as 78.  This is undoubtedly a palpated rhythm as opposed  to an  auscultated rhythm.  The auscultation exam does not refer to  tachycardia.  She also carries a past history of anxiety and manic  depressive disorder which in her mind has been associated with these  palpitations all along.   Her review of systems in addition to the above is notable for:  1. Bladder infections and kidney infections.  2. Constipation.   SOCIAL HISTORY:  She is unmarried.  She has a daughter, who is 4.  She  lives with her boyfriend, who is not the father of her daughter.  She  works as a Press photographer.  Her surgery was to be breast  augmentation.   She smokes, uses some alcohol, and occasional cocaine.   At this time, she was taking Ventolin p.r.n.   She has no known drug allergies.   PHYSICAL EXAMINATION:  GENERAL:  She is a young Caucasian female, who  appearing her stated age of 69.  VITAL SIGNS:  Her blood pressure was 114/74.  Her weight was 113.  Her  heart rate was 120 and irregular.  HEENT:  No icterus or xanthoma.  NECK:  Neck veins were flat.  The carotids are brisk and full  bilaterally without bruits.  BACK:  Without kyphosis or scoliosis.  LUNGS:  Clear.  HEART:  Sounds were  irregular without murmurs or gallops.  ABDOMEN:  Soft.  EXTREMITIES:  Femoral pulses were 2+ and distal pulses were intact.  There is no clubbing, cyanosis, or edema.  NEUROLOGIC:  Grossly normal.   Electrocardiogram dated March 29, 2008, demonstrated atrial  fibrillation which is rather coarse with atrial electrograms at cycle  length as close as 160 msec.  The intervals were -/0.08/0.33, the QRS  axis was 79.   IMPRESSION:  1. Recurrent tachy palpitations.  2. Documented atrial fibrillation with a rapid ventricular response.  3. Modest left ventricular dysfunction.  4. Intermittent cocaine use.  5. History of anxiety/bipolar disorder question related to the above.   DISCUSSION:  Trey Paula, Ms. Herskowitz has atrial fibrillation and a mild  cardiomyopathy.   The initiating event of her atrial fibrillation is not  yet clear to me.  There does not appear to be any significant structural  heart disease to contribute to this as I have reviewed the literature a  little bit over last couple of days, genetic disorders certainly play a  role here.  I also noted a good degree stimulant exposure in the past  may be contributing.   Hyperthyroidism is also potential contributor.   We will plan to check her thyroid basic labs today.  I have begun her on  Coreg at 3.125 b.i.d. to begin rate control in the context of  cardiomyopathy.  I will be in touch with Dr. Edsel Petrin at [facility] to see  what other diagnostic studies need to be pursued.   I should note at this time that her blood work was essentially normal  with a TSH of 0.96.  Her white count was a little bit elevated at 12.1.  Other blood work was all normal.   We are going to see her again in 4 weeks' time.   Thanks very much for allowing [place] to participate in the care.    Sincerely,      Duke Salvia, MD, [facility]  Electronically Signed    SCK/MedQ  DD: 03/31/2008  DT: 03/31/2008  Job #: [id number]   CC:    Brantley Persons, M.D.

## 2010-10-17 NOTE — Discharge Summary (Signed)
[name], [name]              ACCOUNT NO.:  [id number]   MEDICAL RECORD NO.:  [id number]          PATIENT TYPE:  INP   LOCATION:  3732                         FACILITY:  [facility]   PHYSICIAN:  Luis Abed, MD, FACCDATE OF BIRTH:  04/02/1986   DATE OF ADMISSION:  12/01/2008  DATE OF DISCHARGE:  12/04/2008                               DISCHARGE SUMMARY   PRIMARY CARDIOLOGIST:  Luis Abed, MD, [facility].   ELECTROPHYSIOLOGIST:  Judith Salvia, MD, [facility].   PRIMARY CARE [name]:  Dr. Concepcion Elk and OB/GYN will be the [facility] at [facility].   DISCHARGE DIAGNOSIS:  Symptomatic atrial fibrillation.   SECONDARY DIAGNOSES:  1. History of syncope.  2. Atrial tachycardia, status post ablation.  3. Ongoing tobacco abuse, smoking 2-3 cigarettes a day.  4. History of polysubstance abuse including marijuana, cocaine.  5. Anxiety/depression.  6. Gastroesophageal reflux disease.  7. Asthma.  8. Current pregnancy.  9. Intermittent hypotension requiring hydration this admission.  10.Complaints of cramping with round ligament pain seen by Obstetrics      this admission.   ALLERGIES:  No known drug allergies.   PROCEDURES:  Uterine ultrasound showing single living intrauterine  pregnancy without evidence of complication.   HISTORY OF PRESENT ILLNESS:  [name] with prior  history of atrial fibrillation, status post multiple cardioversions.  She also has a history of atrial tachycardia/flutter, status post  ablation.  She previously had been maintained on flecainide and  diltiazem; however, she recently became pregnant, and these were  discontinued and instead she was placed on metoprolol and digoxin.  She  was seen in the emergency department on June 29 secondary to tachy  palpitations and subsequent syncopal episode.  In the ED, she was found  to be in AFib with RVR with a rate of 117.  She was felt to be  dehydrated and aggressively hydrated.   Urine hCG was positive.  She was  recommended admission; however, the patient left the ER.  She saw Dr.  Graciela Branch that afternoon and after discussion decision was made to admit her  for sotalol initiation.   HOSPITAL COURSE:  The patient was placed on sotalol therapy and was  initially noted to have mild hypotension.  She was hydrated with  improvement in blood pressure.  On sotalol therapy, she has had no  recurrent atrial fibrillation.  Her CTC has remained stable, and she is  ready for discharge today.   During this admission, the patient complained of abdominal cramping  similar to what she had prior to previous miscarriage.  Obstetrics was  consulted, and the patient was seen.  Uterine ultrasound was performed  and showed normal intrauterine pregnancy.  It was felt that she had  round ligament pain and daily fetal heart tones were recommended while  the patient was inpatient.  She was also advised to follow up with the  [facility] Clinic at [facility] and a phone number has been  provided.  The patient has been placed on prenatal vitamins.   [name] has a history of  tobacco abuse and has been counseled  extensively on the importance of smoking cessation, especially in light  of ongoing pregnancy.   Finally, [name] has a history of anxiety disorder and has been seen  by Dr. Jeanie Branch.  He has recommended p.r.n. Xanax therapy and also was  recommended outpatient followup, which will be set up by case  management.   DISCHARGE LABS:  Hemoglobin 10.8, hematocrit 31.9, WBC 10.3, platelets  190.  Sodium 136, potassium 4.0, chloride 105, CO2 of 26, BUN 4,  creatinine 0.53, glucose 75, calcium 8.5, magnesium 1.5.  TSH 0.509.   DISPOSITION:  The patient will be discharged home today in good  condition.   FOLLOWUP PLANS AND APPOINTMENTS:  We will arrange a followup with Dr.  Graciela Branch in approximately 3-4 weeks.  She is asked to follow up with the  [facility] as soon as  possible at [phone].   DISCHARGE MEDICATIONS:  1. Sotalol 80 mg q.12 h.  2. Digoxin 0.125 mg daily.  3. Xanax 0.25 mg q.8 h p.r.n.  4. Prenatal vitamins daily.   OUTSTANDING LABORATORY STUDIES:  None.   DURATION OF DISCHARGE ENCOUNTER:  60 minutes including physician time.      [name], ANP      Luis Abed, MD, [facility]  Electronically Signed    CB/MEDQ  D:  12/04/2008  T:  12/04/2008  Job:  [id number]   cc:   [facility] at [facility]

## 2010-10-17 NOTE — Discharge Summary (Signed)
[name], [name]              ACCOUNT NO.:  [id number]   MEDICAL RECORD NO.:  [id number]          PATIENT TYPE:  INP   LOCATION:  2911                         FACILITY:  [facility]   PHYSICIAN:  Duke Salvia, MD, FACCDATE OF BIRTH:  1985-10-10   DATE OF ADMISSION:  04/18/2008  DATE OF DISCHARGE:  04/20/2008                               DISCHARGE SUMMARY   DISCHARGE DIAGNOSES:  1. Paroxysmal atrial fibrillation with rapid ventricular rate, status      post electrocardioversion within 48 hours of symptom onset, the      patient is discharged in normal sinus rhythm.  2. History of paroxysmal atrial fibrillation since mid October.  3. History of questionable bipolar/anxiety disorder.  4. Tetrahydrocannabinol positive on urine drug screen.  5. History of cocaine abuse, but negative on urine drug screen for      cocaine.   DISCHARGE MEDICATIONS:  1. P.o. Coreg 6.25 mg b.i.d., please note that this is twice her      admission dose of Coreg.  2. P.o. flecainide 50 mg b.i.d.  3. P.o. aspirin 81 mg.   Please note that all of these prescriptions were provided to the patient  upon discharge.   CONDITION ON DISCHARGE:  Following electrocardioversion on April 19, 2008, the patient remained in normal sinus rhythm until discharge on  April 20, 2008.  She was asymptomatic and back to her healthy  baseline.  She did not have any complaints and understood the importance  of taking her medications and following up with Dr. Graciela Husbands next week.  The patient is scheduled to see Dr. Graciela Husbands with [facility] on  April 26, 2008, at 2:15 p.m.   PROCEDURES:  The patient had a synchronized DC cardioversion at 150  joules that failed, then had successful synchronized DC cardioversion  with 200 joules with sternal pressure, which restored normal sinus  rhythm.  The procedure was done under sedation with Versed 7 mg IV and  fentanyl 100 mg IV.  Please note that increasing doses of sedation  were  used because the patient has a high tolerance for sedation.  This may  hint at an underlying substance abuse.   CONSULTATIONS:  None.   ADMISSION HISTORY AND PHYSICAL:  Please see the dictated history and  physical by Dr. Arabella Merles on April 18, 2008.  In short, this is a  patient with recurrent paroxysmal atrial fibrillation with RVR that  presented with symptomatic AFib with a heart rate up to 150 on the night  of April 17, 2008, and was seen in the ED early morning of April 18, 2008.  The patient had seen Dr. Graciela Husbands in October 2009, and will be  followed up with him as an outpatient.   HOSPITAL COURSE:  1. Atrial fibrillation with rapid ventricular rate:  The patient was      initially started on a diltiazem drip set at 10 mg per hour along      with Coreg at 3.125 mg b.i.d. for rate control.  The patient was      initially started on aspirin for anticoagulation.  Two doses of 150      mg of flecainide were administered on April 18, 2008, one in the      morning and one in the p.m., but they were unsuccessful in      converting the patient to sinus rhythm.  A third dose of flecainide      and at this time at 300 mg was given on the morning of April 19, 2008, again unsuccessful in converting the patient to sinus rhythm.      For this reason, the patient was started on Lovenox and received      one dose of Coumadin and prepped for electrocardioversion.  The      electrocardioversion was performed by Dr. Shirlee Latch and was successful      in converting the patient to normal sinus rhythm as described      above.  Given that the patient was converted within 48 hours of      admission and has no comorbidities, it was decided not to continue      her on Coumadin or Lovenox as an outpatient.  This was discussed      with Dr. Graciela Husbands and Dr. Sanjuana Kava of [facility]. She will be      anticoagulated with aspirin 81 mg daily and continue on Coreg for      rate  control.  She will also continue on p.o. flecainide 50 mg as      an antiarrhythmic agent and she will follow up with Dr. Graciela Husbands for      further evaluation and treatment.  2. Marijuana use:  The patient was THC positive on admission.  She      also has a history of cocaine abuse.  She was counseled on the      dangers of drugs with her medications, especially the potential      lethal interaction between cocaine and her beta-blocker.  3. Urinary tract infection:  She had a UTI on her April 06, 2008,      admission and received a 5-day course of Cipro, of which she took 4      doses.  A UA on admission was performed that showed positive      nitrites, but negative leukocytes.  There were many bacteria in the      micro, but no wbcs.  This is likely a residual effect from her      prior UTI.  A 3-day course of Cipro should have taken care of a      routine uncomplicated urinary tract infection.  Given that there      was no culture when she was admitted last time, a urine culture was      obtained with sensitivities to ensure that the organism was      susceptible to Cipro.   DISCHARGE LABORATORIES:  The patient's only CMET was on the morning of  admission and it was unremarkable.  The patient had an INR on the  morning of discharge, and it was 1.3, but she will be discontinued on  Coumadin.   Her discharge vitals were as follows:  Temperature of 99.0, blood  pressure of 107/52, respiratory rate of 14, O2 sats of 100, and heart  rate of 58-81 in normal sinus rhythm.   PENDING LABS:  There are no pending labs at this time.      Linward Foster, MD  Electronically Signed      Viviann Spare  C. Graciela Husbands, MD, [facility]  Electronically Signed    LW/MEDQ  D:  04/20/2008  T:  04/20/2008  Job:  [id number]

## 2010-10-17 NOTE — Consult Note (Signed)
[name], [name]              ACCOUNT NO.:  [id number]   MEDICAL RECORD NO.:  [id number]          PATIENT TYPE:  INP   LOCATION:  3729                         FACILITY:  [facility]   PHYSICIAN:  Noralyn Pick. Eden Emms, MD, FACCDATE OF BIRTH:  12-14-1985   DATE OF CONSULTATION:  DATE OF DISCHARGE:                                 CONSULTATION   A 25 year old patient of Dr. Myrtis Ser and Dr. Graciela Husbands we are asked to see her  in the ER for chest pain.  The patient's chest pain is atypical.  She  has had it most of the day today.  It is in the center of her chest.  It  is associated with presyncope.  She has a history of a question  cardiomyopathy.  She was found to be in atrial tachycardia or atrial  fibrillation when seen in the office by Dr. Myrtis Ser.  The patient was  started on Coreg 6.25 b.i.d.  Since starting the medicine, she has felt  lethargic and difficult to concentrate.  She normally works as a Teacher, English as a foreign language and has been unable to think.   The patient initially was having a preop evaluation for breast  augmentation and her heart rate was noted to be high.   Dr. Graciela Husbands had started her on a beta-blocker and wanted to have her get a  3- to- 4-week monitor, it was supposed to start today.   The patient has not had rapid palpitations.  Her chest pain is  persistent.  There is no alleviating factors.  It is not worsening, but  she is concerned.  There is no associated diaphoresis.  She occasionally  gets shortness of breath.  She has been seen by Dr. Sherene Sires in the past for  pneumonia and has a diagnosis of asthma.   She is currently not having any pleuritic component to her pain.   Her review of systems is otherwise negative.   Her past medical history is remarkable for previous intermittent cocaine  use, question tachycardia-induced cardiomyopathy, documentation of a  rapid atrial fibrillation, history of anxiety, and bipolar disorder.   The patient denies smoking or drinking.   The patient is  not married.  She has a 73-year-old daughter.  Her  boyfriend lives with her as well as her sister.  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  She was to have breast augmentation  surgery, which initiated her cardiac workup.   She does drink and smoke on occasion, but not regularly and has done  cocaine in the past.   Family history is remarkable for a brain aneurysm on father's side,  otherwise negative.   The patient's medications included Coreg 6.25 b.i.d.   She denies any allergies.   Her exam is remarkable for a thin young white female with some anxiety.  Blood pressure is 110/70, she is non-postural, pulse is 68 and regular,  respiratory rate 14, afebrile.  HEENT, unremarkable.  Carotids are  normal without bruit.  No lymphadenopathy, thyromegaly, or JVP  elevation.  She has some glitter in the hair from recent Halloween  activities.  Lungs are clear.  There is no rub, no wheezing.  Good  diaphragmatic motion.  S1 and S2.  Normal heart sounds.  PMI normal.  No  pain to palpation.  Abdomen is benign.  Bowel sounds positive.  No AAA.  No tenderness, no bruit.  No hepatosplenomegaly, hepatojugular reflux,  or tenderness.  Distal pulse intact.  No edema.  Neuro nonfocal.  Skin  warm and dry.  No muscular weakness.   Chest x-ray shows some mild bronchial thickening, but no blebs, no  pneumothorax, no acute infiltrate.  EKG shows sinus rhythm with  nonspecific changes.  No acute changes.  No evidence of atrial  fibrillation, flutter, or atrial tachycardia.  Point-of-care enzymes are  negative.  Urinalysis has many bacteria with nitrite positive.  Troponin  is negative.  CPK is negative.  Pregnancy test is negative.  Potassium  is 3.7, BUN is 9, creatinine is 0.7.  CBC shows hematocrit of 39.3,  white count of 6.6.   IMPRESSION:  1. Atypical chest pain.  Admit, rule out myocardial infarction, doubt      coronary disease.  2. Presyncope with postural signs.   Subjectively hydrate 1 L, may be      related to possible urinary tract infection.  Also possibly related      to recent start of Coreg.  We will monitor in telemetry for      excessive bradycardia, decrease to Coreg 3.125 b.i.d., and monitor      for any recurrence of rhythm.  3. Urinary tract infection.  Start Cipro 250 b.i.d.  4. Bipolar anxiety disorder, currently not actively treated.  The      patient's mood and affect appear appropriate.   Further recommendation will be based on her response to a lower dose of  Coreg, hydration, treatment of her UTI, and telemetry results.      Noralyn Pick. Eden Emms, MD, [facility]  Electronically Signed     PCN/MEDQ  D:  04/06/2008  T:  04/07/2008  Job:  [id number]

## 2010-10-17 NOTE — Discharge Summary (Signed)
[name], [name]              ACCOUNT NO.:  [id number]   MEDICAL RECORD NO.:  [id number]          PATIENT TYPE:  INP   LOCATION:  3729                         FACILITY:  [facility]   PHYSICIAN:  Duke Salvia, MD, FACCDATE OF BIRTH:  [birth year]   DATE OF ADMISSION:  04/06/2008  DATE OF DISCHARGE:  04/07/2008                               DISCHARGE SUMMARY   This patient has no known drug allergies.   FINAL DIAGNOSES:  1. Admitted with atypical chest pain coupled with presyncope.      a.     No palpitation.  No nausea.  2. Rule out myocardial infarction.  Troponin I studies show 0.01, then      less than 0.01, then less than 0.01.  3. Presyncopal symptoms are postural involvement.  The patient was      dehydrated and it could be possibly related to urinary tract      infection found this admission.  4. The patient has fatigue and lethargy on Coreg 6.25 mg twice daily.      Also, she has bradycardia on that dose.  Her dose was decreased to      3.125 mg twice daily this admission.  5. Urinary tract infection.  The patient at home on Cipro 250 mg x5      days.  She could go home on trimethoprim/sulfamethoxazole just as      well.   SECONDARY DIAGNOSES:  1. History of atrial arrhythmias.      a.     Atrial tachycardia versus atrial fibrillation.      b.     The patient has cardiac monitor which will be activated       today April 07, 2008, and will wear for the next 3-4 weeks.  2. Atrial arrhythmias were discovered at preoperative evaluation for      breast augmentation surgery.  3. 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      a.     A 2-D echocardiogram on March 29, 2008, ejection fraction       50%.  No left ventricular wall motion abnormalities.  4. History of cocaine abuse.  5. Anxiety.  6. Bipolar disorder.  7. The patient will be starting 4-week monitor April 07, 2008.   PROCEDURE:  No procedures this admission except for cardiac enzymes to  measure  possible acute myocardial infarction.  These were all negative.   BRIEF HISTORY:  [name] is a 25 year old female, who presented in the  emergency room with chest pain.  The chest pain seems to be atypical and  it has lasted for most of the day.  On April 06, 2008, it is located  in the center of her chest and it is associated with presyncope.   She has a question of a cardiomyopathy.  She is found to be in an atrial  arrhythmia either atrial tachycardia or atrial fibrillation when seen in  the office by Dr. Myrtis Ser.  The patient apparently was having a preop  evaluation for breast augmentation surgery and her heart rate was noted  to be high.  The patient is  scheduled to start a CardioNet monitor with  activation on April 07, 2008.   The patient also has been started in the past on Coreg 6.25 mg twice  daily.  Her electrocardiogram on admission shows a bradycardia.  She  also claimed that she has fatigue and lethargic on this dose of  medication.  However, she does say that this has significantly improved  her quality of life from the standpoint of atrial arrhythmias and has  been able to quell them.  The patient will be admitted.  She will have a  rule out for myocardial infarction.  A urinalysis also shows urinary  tract infection is active.  The patient seemed to have postural  involvement as well.  The plan will be to admit the patient to obtain  serial cardiac enzymes, treat her urinary tract infection and give some  reassurance that it is probably a atypical chest pain, start Cipro and  decrease the dose of Coreg.   HOSPITAL COURSE:  The patient presents to the emergency room on April 06, 2008.  She is complaining of chest pain, which is thought to be  atypical.  During the evening of April 06, 2008, the pain had  dissipated.  The patient had been on IV heparin, but this has been  stopped.  The patient was started on treatment for urinary tract  infection with Cipro.   Her Coreg dose has been decreased from 6.25 mg  twice daily to 3.125 mg daily with the relief from a sinus bradycardia  to heart rates in the 60s and 70s.  The patient suitable for discharge  on hospital day #2.   She will be discharged on the following medications:  1. Cipro 250 mg 1 tablet daily for 5 days.  2. Coreg 3.125 mg twice daily, a new dose.   She will activate the CardioNet monitor today April 07, 2008, and she  will see Dr. Graciela Husbands on Monday April 23, 2008, at 2:15.   LABORATORY STUDIES FOR THIS ADMISSION:  Hemoglobin 13.3, hematocrit  39.3, white cells 6.6, and platelets of 223.  Serum electrolytes, sodium  138, potassium 3.7, chloride 105, carbonate 26, BUN is 9, creatinine  0.72, and glucose is 89.  A protime is 14.3, INR is 1.1.  Once again,  troponin I studies are 0.01 x3.  Urinalysis was positive for nitrate.  Serum pregnancy test was negative.  Once again, the patient also  received effective IV hydration for postural symptoms.      Maple Mirza, [place]      Duke Salvia, MD, [facility]  Electronically Signed    GM/MEDQ  D:  04/07/2008  T:  04/08/2008  Job:  [id number]

## 2010-10-17 NOTE — Assessment & Plan Note (Signed)
[facility]                         ELECTROPHYSIOLOGY OFFICE NOTE   NAME:BOLINJenisse, Vullo                     MRN:          [id number]  DATE:04/26/2008                            DOB:          03-25-1986    Ms. Lachman comes in today in followup of her atrial fibrillation that had  caused her to have 2 hospitalizations in last month or so.  She is  ultimately cardioverted and discharged on flecainide.  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  Unfortunately since then it has gradually lessened but  is still a problem.  There is a sense of virtual reality to her life.  It is concern with related to driving for example.   She also is having problems sleeping given the medical stresses.   She also is complaining of chest pain over her left sternum.  This is  aggravated by motion, coughing, and breathing.  It continues to be  worsening rather than getting better.   MEDICATIONS:  1. Flecainide 50 b.i.d.  2. Aspirin 81.  3. Coreg 3.125.   On examination, she was in no acute distress.  Her blood pressure was  96/60, her pulse was 76.  Her lungs were clear.  Her heart sounds were  irregular with short isolated PACs as well as irregular runs.  She had  costochondral tenderness at the left lower sternal border.  The abdomen  was soft and extremities were without edema.   IMPRESSION:  1. Chest pain likely related to costochondritis/trauma from her      cardioversion (i.e., pressure).  2. Atrial fibrillation - paroxysmal with ongoing premature atrial      contractions s but no sustained atrial fibrillation.  3. Flecainide for atrial fibrillation, potentially contributing to      fugue state.  4. 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   We will plan to try to sort out what the medications are that of  contributing to her lassitude and fugue state.  We will hold her Coreg  for 1 week.  We will then discontinue her flecainide, begin her on  propafenone 150 b.i.d.  We will sit down with her again in 3 weeks'  time.  She will be kept off of her Coreg in the interim so as to try to  maximize our ability to identify the drug at risk.   We also discussed catheter ablation, and she was declined to pursue  this.  We also discussed the potential value of seeing behavioral  medicine.  In the interim, I told her to think about taking  diphenhydramine for sleep.   We will see her again as noted above.     Duke Salvia, MD, [facility]  Electronically Signed    SCK/MedQ  DD: 04/26/2008  DT: 04/27/2008  Job #: [id number]

## 2010-10-17 NOTE — Assessment & Plan Note (Signed)
[facility]                            CARDIOLOGY OFFICE NOTE   NAME:BOLINMersedes, Alber                     MRN:          [id number]  DATE:03/25/2008                            DOB:          1985/08/29    Ms. Heydt is here for the evaluation of rapid heartbeat.  She has seen  by our pulmonary team.  She has asthma.  She was cleared for breast  augmentation surgery.  When she was there today, she was attached to a  monitor and she had a rapid supraventricular rhythm.  She was referred  to [place] for immediate evaluation.  She is stable at this time.  She is not  having any chest pain.  She does feel palpitations historically.  Her  exercise tolerance is definitely limited by these palpitations.  When  she became upset in the past several months, she did have a syncopal  episode.  She was not injured.  This was witnessed and as soon as she  was laid down on the floor she woke up.  There is no family history of  sudden cardiac death.   PAST MEDICAL HISTORY:   ALLERGIES:  No known drug allergies.   MEDICATIONS:  She is not on any significant medications.   OTHER MEDICAL PROBLEMS:  See the list below.   REVIEW OF SYSTEMS:  She is not having any GI or GU symptoms.  She is not  having any fevers or chills or headaches.  Otherwise her review of  systems is negative.   PHYSICAL EXAMINATION:  GENERAL:  The patient is anxious, but  appropriate.  She is here with her boyfriend.  The patient is oriented  to person, time, and place.  Affect is normal.  VITAL SIGNS:  Blood pressure is 113/61 with a pulse that ranges from 70-  140.  HEENT:  No xanthelasma.  She has normal extraocular motion.  There are  no carotid bruits.  There is no jugular venous distention.  LUNGS:  Clear.  Respiratory effort is not labored.  CARDIAC:  S1 with an S2.  There are no clicks or significant murmurs.  ABDOMEN:  Soft.  EXTREMITIES:  She has no significant peripheral edema.   Rhythm strips sent to US show atrial flutter with varying rate.  Her 12-  lead EKG reveals atrial flutter with varying ventricular rate.   PROBLEMS:  1. History of asthma  2. History of gastroesophageal reflux disease.  3. History of some anxiety and depression.  4. Atrial flutter.  The patient is symptomatic from her atrial      flutter.  At her age and with her current symptoms, I believe that      medications will not be the optimal way to treat her.  I believe      that she needs to be evaluated for the possibility of a flutter      ablation.  I have      explained this to the patient and her boyfriend.  We will arrange      for early electrophysiology evaluation.     Duane Lope.  Myrtis Ser, MD, [facility]  Electronically Signed    JDK/MedQ  DD: 03/25/2008  DT: 03/25/2008  Job #: [id number]   cc:   Brantley Persons, M.D.

## 2010-10-17 NOTE — H&P (Signed)
NAMELACHLAN, PELTO              ACCOUNT NO.:  [id number]   MEDICAL RECORD NO.:  [id number]          PATIENT TYPE:  INP   LOCATION:  2911                         FACILITY:  [facility]   PHYSICIAN:  Darryl D. Prime, MD    DATE OF BIRTH:  09/08/1985   DATE OF ADMISSION:  04/18/2008  DATE OF DISCHARGE:  04/20/2008                              HISTORY & PHYSICAL   PHONE NOTE   The patient has a history of atrial fibrillation and mild nonischemic  cardiomyopathy status post cardioversion.  She is complaining  approximately May 04, 2008, of palpitation, skipped beats with mild  shortness of breath.  She was seen in clinic on April 28, 2008.  At  which time, she was having episodes of fugue state, lassitude and was  told to hold Coreg for a week.  But she is still have these symptoms.  I  told her to take Coreg tonight as it has been a week to see these helps  her tachy palpitations and to call Dr. Odessa Fleming office in the morning.  She is to begin propafenone 150 mg twice a day starting in the morning.      Darryl D. Prime, MD  Electronically Signed     DDP/MEDQ  D:  05/04/2008  T:  05/04/2008  Job:  [id number]

## 2010-10-17 NOTE — Discharge Summary (Signed)
NAMESUMAYA, RIEDESEL NO.:  [id number]   MEDICAL RECORD NO.:  [id number]          PATIENT TYPE:  EMS   LOCATION:  MAJO                         FACILITY:  [facility]   PHYSICIAN:  Luis Abed, MD, FACCDATE OF BIRTH:  August 08, 1985   DATE OF ADMISSION:  11/30/2008  DATE OF DISCHARGE:                               DISCHARGE SUMMARY   PRIMARY CARDIOLOGIST:  Duke Salvia, MD, [facility]   PRIMARY CARE PHYSICIAN:  The patient does not have one.   HISTORY OF PRESENT ILLNESS:  This is [name] with  known history of atrial fibrillation with multiple admissions for AFib  DCCV ablation and has been seen also by Dr. Concepcion Elk at [facility] and  also by Dr. Harless Nakayama at [facility] [place].  The patient is recently pregnant and is  about 2 and 3-1/4 months along.  When before she left [place], she was  seen by Dr. Harless Nakayama and had been on flecainide and Coreg but was taken  off of this and placed on digoxin 0.125 mg daily and metoprolol 50 mg  daily.  He stated it if she became further longer in her pregnancy, she  may have a need to increase her dose.  He told her if her heart rate  became rapid that she could double her dose of the digoxin and  metoprolol at that time.   The patient had had no complaints of irregular heart rate or rapid heart  rate until last night when she had an argument with her boyfriend.  At  that time, her heart rate went way up.  She said she could feel that  rapidly racing and she did take an additional dose of metoprolol and  digoxin.  The patient states her heart rate did not go to normal rhythm  or reduced in rate throughout the night.  This morning she made a  decision to come to the emergency room.  She does have a followup  appointment scheduled with Dr. Graciela Husbands on December 09, 2008.  Her sister was  driving and the patient stopped by a store to get something to drink.  While she  was in the store, the patient passed out.  The patient was  brought to [facility] and was found to be in atrial fib  with RVR.  The patient's heart rate on admission was 117 beats per  minute.  The patient was given 1 L of IV fluids.  Labs were drawn and no  x-rays were completed.  She was confirmed to be pregnant with an HCG  positive.  As a result of this, we were asked to see the patient in the  emergency room.  Currently, the patient denies any shortness of breath  or chest pain.  She continues to feel her heart rate rapid and she is  quite anxious.  Review of systems is positive for rapid heart rate,  palpitations, and syncopal episode.  All other systems were reviewed and  found to be negative.   PAST MEDICAL HISTORY:  1. Asthma,  GERD, anxiety and depression, atrial flutter, atrial fib      with [facility] cardioversion in November 2009.  The patient had an      ablation done as well and also was followed by Dr. Concepcion Elk in [facility].  On June 16, 2008, she had a second ablation.  2. The patient was seen by Dr. Harless Nakayama at [facility] at [facility] [place] where she had had multiple DCCV completed and      was changed to Cardizem and flecainide at that time.  3. The patient was positive for pregnancy, therefore medications were      adjusted to digoxin and metoprolol.  4. History of cocaine and marijuana use.  5. Anxiety.   SOCIAL HISTORY:  She currently lives in [place] with her grandmother  and her 55-year-old daughter.  The patient is not employed.  The patient  is single.  She smokes 2-3 cigarettes a day, more when she is upset.  Negative for EtOH.  She has not used illicit drugs in greater than 1  year.   FAMILY HISTORY:  Mother with brain aneurysms.  Father in good health.  She has a sister who has racing heart issues as well.   CURRENT MEDICATIONS:  1. Digoxin 0.125 mg daily.  2. Metoprolol 50 mg daily (the patient took an extra dose  last night).   ALLERGIES:  No known drug allergies.   CURRENT LABORATORY DATA:  Sodium 137, potassium 4.3, chloride 107, CO2  24, BUN 6, creatinine 0.44, and glucose 80.  Hemoglobin 12.3, hematocrit  35.7, white blood cells 8.8, and platelets 206.  No chest x-ray was  completed.   PHYSICAL EXAMINATION:  VITAL SIGNS:  Blood pressure 118/91, pulse 103,  respirations 22, temperature 96.0, and O2 sat 99% on 2 L.  GENERAL:  She is awake, alert, oriented, and anxious but smiling.  HEENT:  Head is normocephalic and atraumatic.  Eyes:  PERRLA.  Mucous  membranes of mouth are pink and moist.  Tongue is midline.  NECK:  Supple without JVD or carotid bruits appreciated.  CARDIOVASCULAR:  Rapid irregular heart rate without murmurs, rubs, or  gallops.  Pulses are 2+ and equal.  LUNGS:  Clear to auscultation without wheezes, rales, or rhonchi.  SKIN:  Warm and dry.  ABDOMEN:  Soft and nontender.  It does show evidence of pregnancy.  EXTREMITIES:  Without clubbing, cyanosis, or edema.  NEURO:  Cranial nerves II-XII are grossly intact.   IMPRESSION:  1. History of atrial fibrillation.  2. Atrial fibrillation with rapid ventricular response.  3. Currently pregnant.   PLAN:  The patient has been seen and examined by myself and by Dr. Myrtis Ser  in the emergency room.  Records have been requested from Dr. Sherrie Mustache in  [place] and also has been seen by Dr. Concepcion Elk at [facility], records  will be requested.  Because of pregnancy, flecainide was stopped along  with Cardizem and the patient has been placed on digoxin and metoprolol.  The patient refuses admission at this time stating that staying in the  hospital makes her more anxious.  She does have a followup appointment  with Dr. Graciela Husbands on December 09, 2008.  I have called Dr. Odessa Fleming nurse to  explain the patient's symptoms and the Dr. Odessa Fleming nurse has stated that  there is an office appointment available this afternoon at 4:00 p.m.  secondary to a  cancellation.  I have  spoken with the patient who states  that she  will go to Dr. Odessa Fleming office this afternoon.  The patient has been  advised to keep hydrated and take medications as directed.  Dr. Graciela Husbands  and will make further recommendations in his office this afternoon.  Records will be sent as they come in to Dr. Odessa Fleming office.      Bettey Mare. Lyman Bishop, NP      Luis Abed, MD, [facility]  Electronically Signed    KML/MEDQ  D:  11/30/2008  T:  12/01/2008  Job:  [id number]

## 2010-10-17 NOTE — Letter (Signed)
April 06, 2008     RE:  [name], [name]  MRN:  [id number]  /  DOB:  1985/10/24   To Whom It May Concern:   We were asked to see [name] urgently on March 25, 2008,  because of previously unknown tachycardia.  It turns out that this  tachycardia has been intermittent for some time, increasingly frequent,  and has been associated with the development of a cardiomyopathy.  At  this point, it would be ill-advised for her to proceed with elective  surgery until the tachycardia issues are resolved.  Further, it would be  preferable for her to not undergo elective surgery until her  cardiomyopathy resolves too.  This could take some time.   We thank you in advance for your consideration of this and appreciating  that the decision for her not to proceed with her surgery was a medical  one initiated by the anesthesiologists and confirmed by [place] and so we  hope that you would concur that it would be appropriate that she have  her deposits refunded.   If I can be of further assistance, please do not hesitate to contact me.    Sincerely,      Duke Salvia, MD, [facility]  Electronically Signed    SCK/MedQ  DD: 04/06/2008  DT: 04/06/2008  Job #: [id number]   CC:    [place], 2nd Floor, Cardiology
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
## 2010-10-20 NOTE — Discharge Summary (Signed)
[facility]  Patient:    [name], [name]                     MRN: [id number] Adm. Date:  [id number] Disc. Date: 04/02/00 Attending:  Veneta Penton                           Discharge Summary  REASON FOR ADMISSION:  This 25 year old white female was admitted after attempting to cut her wrists as a suicide attempt.  For further history of present illness, please see the patients psychiatric admission assessment.  PHYSICAL EXAMINATION:  Her physical examination at the time of admission was remarkable for a history of scoliosis, gastroesophageal reflux disease, iron deficiency anemia and she had a history of being sexually active.  LABORATORY EXAMINATION:  A urine probe for gonorrhea and chlamydia was negative.  A CBC showed an MCHC of 35.2 and was otherwise unremarkable. Routine chemistry panel was within normal limits.  A hepatic panel was within normal limits.  Thyroid function tests were within normal limits.  A urine pregnancy test was negative.  An RPR was nonreactive. Urinalysis was unremarkable.  The patient received no x-rays, no special procedures and no additional consultations during the course of her hospitalization.  HOSPITAL COURSE:  The patient rapidly adapted to unit routine, socializing well with both patients and staff.  She was begun on a trial of Celexa 20 mg p.o. q.d. and tolerated this well.  She has participated in all aspects of the therapeutic treatment program.  Her affect and mood have improved, and she has continued to deny any homicidal or suicidal ideation.  It is felt the patient at the present, consequently is ready for discharge to a less restrictive alternative setting.  CONDITION ON DISCHARGE:  Improves.  DSM-IV DIAGNOSES: Axis I:    1. Major depression, single episode severe with mood congruent               psychosis.            2. Conduct disorder. Axis II:   None. Axis III:  1. Scoliosis.  2. Gastroesophageal reflux disease.            3. Iron deficiency anemia. Axis IV:   Current psychosocial stressors are severe. Axis V:    Code 20 on admission, code 30 on discharge.  FURTHER EVALUATION AND TREATMENT RECOMMENDATIONS: 1. The patient is discharged to home. 2. The patient will follow up with her outpatient psychiatrist and individual    and family therapist for all further treatment of her mental health care    and consequently, I will sign off onthe case at this time. 3. The patient is discharged on Celexa 20 mg p.o. q.d. 4. The patient is discharged on an unrestricted level of activity and a    regular diet. DD:  04/02/00 TD:  04/02/00 Job: [id number]

## 2010-11-15 ENCOUNTER — Emergency Department (HOSPITAL_COMMUNITY)
Admission: EM | Admit: 2010-11-15 | Discharge: 2010-11-15 | Disposition: A | Discharge status: Home / Self Care | Payer: Self-pay | Attending: Emergency Medicine | Admitting: Emergency Medicine

## 2010-11-15 ENCOUNTER — Emergency Department (HOSPITAL_COMMUNITY): Payer: Self-pay

## 2010-11-15 DIAGNOSIS — R5383 Other fatigue: Secondary | ICD-10-CM | POA: Insufficient documentation

## 2010-11-15 DIAGNOSIS — R0989 Other specified symptoms and signs involving the circulatory and respiratory systems: Secondary | ICD-10-CM | POA: Insufficient documentation

## 2010-11-15 DIAGNOSIS — M549 Dorsalgia, unspecified: Secondary | ICD-10-CM | POA: Insufficient documentation

## 2010-11-15 DIAGNOSIS — R197 Diarrhea, unspecified: Secondary | ICD-10-CM | POA: Insufficient documentation

## 2010-11-15 DIAGNOSIS — R0609 Other forms of dyspnea: Secondary | ICD-10-CM | POA: Insufficient documentation

## 2010-11-15 DIAGNOSIS — R5381 Other malaise: Secondary | ICD-10-CM | POA: Insufficient documentation

## 2010-11-15 DIAGNOSIS — R0789 Other chest pain: Secondary | ICD-10-CM

## 2010-11-15 DIAGNOSIS — R42 Dizziness and giddiness: Secondary | ICD-10-CM | POA: Insufficient documentation

## 2010-11-15 DIAGNOSIS — R110 Nausea: Secondary | ICD-10-CM | POA: Insufficient documentation

## 2010-11-15 DIAGNOSIS — R1011 Right upper quadrant pain: Secondary | ICD-10-CM | POA: Insufficient documentation

## 2010-11-15 DIAGNOSIS — F411 Generalized anxiety disorder: Secondary | ICD-10-CM | POA: Insufficient documentation

## 2010-11-15 DIAGNOSIS — I4891 Unspecified atrial fibrillation: Secondary | ICD-10-CM | POA: Insufficient documentation

## 2010-11-15 DIAGNOSIS — R002 Palpitations: Secondary | ICD-10-CM

## 2010-11-15 DIAGNOSIS — J45909 Unspecified asthma, uncomplicated: Secondary | ICD-10-CM | POA: Insufficient documentation

## 2010-11-15 DIAGNOSIS — R630 Anorexia: Secondary | ICD-10-CM | POA: Insufficient documentation

## 2010-11-15 DIAGNOSIS — R079 Chest pain, unspecified: Secondary | ICD-10-CM | POA: Insufficient documentation

## 2010-11-15 DIAGNOSIS — R0602 Shortness of breath: Secondary | ICD-10-CM | POA: Insufficient documentation

## 2010-11-15 DIAGNOSIS — Z79899 Other long term (current) drug therapy: Secondary | ICD-10-CM | POA: Insufficient documentation

## 2010-11-15 LAB — DIFFERENTIAL
Eosinophils Relative: 3 % (ref 0–5)
Lymphocytes Relative: 29 % (ref 12–46)
Lymphs Abs: 2.6 10*3/uL (ref 0.7–4.0)
Monocytes Absolute: 1.1 10*3/uL — ABNORMAL HIGH (ref 0.1–1.0)

## 2010-11-15 LAB — BASIC METABOLIC PANEL
BUN: 11 mg/dL (ref 6–23)
Chloride: 100 mEq/L (ref 96–112)
Glucose, Bld: 89 mg/dL (ref 70–99)
Potassium: 3.7 mEq/L (ref 3.5–5.1)

## 2010-11-15 LAB — CBC
HCT: 42.3 % (ref 36.0–46.0)
MCHC: 35.9 g/dL (ref 30.0–36.0)
MCV: 85.6 fL (ref 78.0–100.0)
RDW: 13 % (ref 11.5–15.5)

## 2010-11-17 ENCOUNTER — Other Ambulatory Visit: Payer: Self-pay

## 2010-11-17 MED ORDER — SOTALOL HCL 120 MG PO TABS: 120.0000 mg | ORAL_TABLET | Freq: Two times a day (BID) | ORAL | Status: DC

## 2010-11-17 MED ORDER — SOTALOL HCL 120 MG PO TABS: 120.0000 mg | ORAL_TABLET | Freq: Every day | ORAL | Status: DC

## 2010-11-20 ENCOUNTER — Telehealth: Payer: Self-pay | Admitting: Internal Medicine

## 2010-11-20 MED ORDER — DIGOXIN 250 MCG PO TABS: 250.0000 ug | ORAL_TABLET | Freq: Every day | ORAL | Status: DC

## 2010-11-20 NOTE — Telephone Encounter (Signed)
Digoxin .25 mg. [facility]  [phone]
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
## 2010-12-15 ENCOUNTER — Encounter: Payer: Self-pay | Admitting: Cardiovascular Disease

## 2010-12-21 ENCOUNTER — Telehealth: Payer: Self-pay | Admitting: Internal Medicine

## 2010-12-21 NOTE — Telephone Encounter (Signed)
Left a message to call back.

## 2010-12-21 NOTE — Telephone Encounter (Signed)
Per pt call, pt has not taken sotalol in two days. Last time pt did not take RX for one day MD recommended that she have a EKG. Pt wants to know if she needs to have another EKG. Please return pt call and advise.

## 2010-12-22 NOTE — Telephone Encounter (Signed)
Per Dr. Graciela Husbands, no EKG needed, just restart Sotalol. The patient is aware.

## 2011-03-06 LAB — RAPID URINE DRUG SCREEN, HOSP PERFORMED: Tetrahydrocannabinol: POSITIVE — AB

## 2011-03-06 LAB — URINE CULTURE
Colony Count: >=100000
Colony Count: >=100000

## 2011-03-06 LAB — COMPREHENSIVE METABOLIC PANEL
ALT: 12
AST: 17
CO2: 22
Chloride: 110
Creatinine, Ser: 0.63
GFR calc Af Amer: >60
GFR calc non Af Amer: >60
Glucose, Bld: 88
Sodium: 138
Total Bilirubin: 0.7

## 2011-03-06 LAB — CBC
HCT: 39.3
Hemoglobin: 13.3
Hemoglobin: 13.6
MCHC: 33.6
MCHC: 33.9
MCV: 91.7
MCV: 92.3
Platelets: 223
RBC: 4.26
RBC: 4.4
RDW: 12.7
RDW: 13.1
WBC: 6.6

## 2011-03-06 LAB — URINE MICROSCOPIC-ADD ON

## 2011-03-06 LAB — BASIC METABOLIC PANEL
BUN: 9
CO2: 22
CO2: 26
Calcium: 9.4
Chloride: 105
Chloride: 110
Creatinine, Ser: 0.72
GFR calc Af Amer: >60
GFR calc Af Amer: >60
GFR calc non Af Amer: >60
Glucose, Bld: 89
Glucose, Bld: 90
Potassium: 3.7
Sodium: 138
Sodium: 139

## 2011-03-06 LAB — URINALYSIS, ROUTINE W REFLEX MICROSCOPIC
Bilirubin Urine: NEGATIVE
Glucose, UA: NEGATIVE
Hgb urine dipstick: NEGATIVE
Ketones, ur: NEGATIVE
Leukocytes, UA: NEGATIVE
Nitrite: POSITIVE — AB
Nitrite: POSITIVE — AB
Protein, ur: NEGATIVE
Specific Gravity, Urine: 1.01
Specific Gravity, Urine: 1.015
Urobilinogen, UA: 0.2
pH: 6
pH: 6.5

## 2011-03-06 LAB — PHOSPHORUS: Phosphorus: 4.4

## 2011-03-06 LAB — TROPONIN I: Troponin I: 0.01

## 2011-03-06 LAB — DIFFERENTIAL
Basophils Absolute: 0.1
Basophils Relative: 1
Eosinophils Absolute: 0.3
Eosinophils Relative: 5
Lymphocytes Relative: 42
Lymphs Abs: 2.8
Monocytes Absolute: 0.7
Monocytes Relative: 10
Neutro Abs: 2.8
Neutrophils Relative %: 43

## 2011-03-06 LAB — CK TOTAL AND CKMB (NOT AT ARMC)
CK, MB: 0.6
CK, MB: 0.6
Relative Index: INVALID
Total CK: 45

## 2011-03-06 LAB — APTT: aPTT: 33

## 2011-03-06 LAB — MAGNESIUM: Magnesium: 1.8

## 2011-03-06 LAB — CARDIAC PANEL(CRET KIN+CKTOT+MB+TROPI)
CK, MB: 0.5
Relative Index: INVALID
Total CK: 28
Total CK: 38
Troponin I: <0.01

## 2011-03-06 LAB — POCT I-STAT, CHEM 8
BUN: 14
Calcium, Ion: 1.11 — ABNORMAL LOW
Chloride: 106
Glucose, Bld: 99
HCT: 41
TCO2: 22

## 2011-03-06 LAB — PROTIME-INR: INR: 1.1

## 2011-03-06 LAB — POCT PREGNANCY, URINE: Preg Test, Ur: NEGATIVE

## 2011-03-06 LAB — HEPARIN LEVEL (UNFRACTIONATED): Heparin Unfractionated: 0.36

## 2011-03-06 LAB — POCT CARDIAC MARKERS: Myoglobin, poc: 33.6

## 2011-03-07 LAB — POCT RAPID STREP A: Streptococcus, Group A Screen (Direct): NEGATIVE

## 2011-08-01 ENCOUNTER — Other Ambulatory Visit: Payer: Self-pay | Admitting: Internal Medicine

## 2011-08-01 MED ORDER — SOTALOL HCL 120 MG PO TABS: 120.0000 mg | ORAL_TABLET | Freq: Two times a day (BID) | ORAL | Status: DC

## 2011-08-01 NOTE — Telephone Encounter (Signed)
Patient needs appt, before refills can be made

## 2011-08-01 NOTE — Telephone Encounter (Signed)
New Refill   Patient called to refill sotalol RX,  Verified pharmacy   [facility], [place]  Patient can be reached at hm# [phone] for additional info

## 2011-08-21 ENCOUNTER — Emergency Department (HOSPITAL_COMMUNITY): Payer: Self-pay

## 2011-08-21 ENCOUNTER — Other Ambulatory Visit: Payer: Self-pay

## 2011-08-21 ENCOUNTER — Encounter (HOSPITAL_COMMUNITY): Payer: Self-pay | Admitting: Emergency Medicine

## 2011-08-21 ENCOUNTER — Emergency Department (HOSPITAL_COMMUNITY)
Admission: EM | Admit: 2011-08-21 | Discharge: 2011-08-21 | Disposition: A | Discharge status: Home / Self Care | Payer: Self-pay | Attending: Emergency Medicine | Admitting: Emergency Medicine

## 2011-08-21 DIAGNOSIS — J45909 Unspecified asthma, uncomplicated: Secondary | ICD-10-CM | POA: Insufficient documentation

## 2011-08-21 DIAGNOSIS — M545 Low back pain, unspecified: Secondary | ICD-10-CM | POA: Insufficient documentation

## 2011-08-21 DIAGNOSIS — K219 Gastro-esophageal reflux disease without esophagitis: Secondary | ICD-10-CM | POA: Insufficient documentation

## 2011-08-21 DIAGNOSIS — I4891 Unspecified atrial fibrillation: Secondary | ICD-10-CM | POA: Insufficient documentation

## 2011-08-21 DIAGNOSIS — F329 Major depressive disorder, single episode, unspecified: Secondary | ICD-10-CM | POA: Insufficient documentation

## 2011-08-21 DIAGNOSIS — R29898 Other symptoms and signs involving the musculoskeletal system: Secondary | ICD-10-CM | POA: Insufficient documentation

## 2011-08-21 DIAGNOSIS — F3289 Other specified depressive episodes: Secondary | ICD-10-CM | POA: Insufficient documentation

## 2011-08-21 DIAGNOSIS — R404 Transient alteration of awareness: Secondary | ICD-10-CM | POA: Insufficient documentation

## 2011-08-21 DIAGNOSIS — M546 Pain in thoracic spine: Secondary | ICD-10-CM | POA: Insufficient documentation

## 2011-08-21 DIAGNOSIS — R4182 Altered mental status, unspecified: Secondary | ICD-10-CM | POA: Insufficient documentation

## 2011-08-21 DIAGNOSIS — M542 Cervicalgia: Secondary | ICD-10-CM | POA: Insufficient documentation

## 2011-08-21 DIAGNOSIS — I498 Other specified cardiac arrhythmias: Secondary | ICD-10-CM | POA: Insufficient documentation

## 2011-08-21 DIAGNOSIS — R55 Syncope and collapse: Secondary | ICD-10-CM | POA: Insufficient documentation

## 2011-08-21 DIAGNOSIS — R51 Headache: Secondary | ICD-10-CM | POA: Insufficient documentation

## 2011-08-21 DIAGNOSIS — Z79899 Other long term (current) drug therapy: Secondary | ICD-10-CM | POA: Insufficient documentation

## 2011-08-21 LAB — BASIC METABOLIC PANEL
CO2: 25 mEq/L (ref 19–32)
Chloride: 104 mEq/L (ref 96–112)
Creatinine, Ser: 0.55 mg/dL (ref 0.50–1.10)
Sodium: 141 mEq/L (ref 135–145)

## 2011-08-21 LAB — CBC
HCT: 39.3 % (ref 36.0–46.0)
RDW: 13.3 % (ref 11.5–15.5)
WBC: 9.6 10*3/uL (ref 4.0–10.5)

## 2011-08-21 LAB — TROPONIN I: Troponin I: <0.3 ng/mL (ref <0.30)

## 2011-08-21 LAB — DIFFERENTIAL
Basophils Absolute: 0.1 10*3/uL (ref 0.0–0.1)
Lymphocytes Relative: 30 % (ref 12–46)
Monocytes Absolute: 0.8 10*3/uL (ref 0.1–1.0)
Neutro Abs: 5.5 10*3/uL (ref 1.7–7.7)

## 2011-08-21 LAB — DIGOXIN LEVEL: Digoxin Level: 0.8 ng/mL (ref 0.8–2.0)

## 2011-08-21 MED ORDER — ACETAMINOPHEN 325 MG PO TABS
650.0000 mg | ORAL_TABLET | Freq: Once | ORAL | Status: AC
  Administered 2011-08-21: 650 mg via ORAL
  Filled 2011-08-21: qty 2

## 2011-08-21 NOTE — ED Notes (Signed)
Patient requesting some tylenol for headache.  MD notified.

## 2011-08-21 NOTE — ED Provider Notes (Signed)
History     CSN: [id number]  Arrival date & time 08/21/11  0125   None     Chief Complaint  Patient presents with  . Fall  . Seizures    (Consider location/radiation/quality/duration/timing/severity/associated sxs/prior treatment) HPI Comments: 26 year old female with a history of atrial fibrillation, depression, asthma who presents with a complaint of syncope, loss of consciousness and altered mental status. According to the paramedics the patient was found by her fianc "flopping around on the floor of the bathroom". He was able to lift her and place her on the bed, when paramedics arrived aspirin encounter. She denies having any prodromal symptoms including chest pain yesterday, severe headache today. After the fall she does admit to having headache and neck pain. The syncope was acute in onset, intermittent, resolve spontaneously and was not associated with tongue biting or urinary incontinence. She does admit to having 2 shots of alcohol this evening but has no history of alcohol withdrawal seizures or any other spontaneous seizures.  Patient is a 26 y.o. female presenting with fall and seizures. The history is provided by the patient and the EMS personnel. The history is limited by the condition of the patient (Memory loss, confusion).  Fall  Seizures     Past Medical History  Diagnosis Date  . Asthma   . Atrial fibrillation     LV function lower limit of normal   . GERD (gastroesophageal reflux disease)   . Depression     History reviewed. No pertinent past surgical history.  Family History  Problem Relation Age of Onset  . Asthma Brother     History  Substance Use Topics  . Smoking status: Current Everyday Smoker    Types: Cigarettes  . Smokeless tobacco: Not on file   Comment: since 2002; smokes about 8 cig/day   . Alcohol Use: Yes     weekends     OB History    Grav Para Term Preterm Abortions TAB SAB Ect Mult Living                  Review of  Systems  Neurological: Positive for seizures.  All other systems reviewed and are negative.    Allergies  Review of patient's allergies indicates no known allergies.  Home Medications   Current Outpatient Rx  Name Route Sig Dispense Refill  . DIGOXIN 0.25 MG PO TABS Oral Take 250 mcg by mouth daily.    Marland Kitchen SOTALOL HCL 120 MG PO TABS Oral Take 120 mg by mouth 2 (two) times daily.      BP 126/70  Pulse 53  Temp(Src) 98.4 F (36.9 C) (Oral)  Resp 15  SpO2 100%  Physical Exam  Nursing note and vitals reviewed. Constitutional: She appears well-developed and well-nourished. No distress.  HENT:  Head: Normocephalic and atraumatic.  Mouth/Throat: Oropharynx is clear and moist. No oropharyngeal exudate.  Eyes: Conjunctivae and EOM are normal. Pupils are equal, round, and reactive to light. Right eye exhibits no discharge. Left eye exhibits no discharge. No scleral icterus.  Neck: No JVD present. No thyromegaly present.  Cardiovascular: Normal rate, normal heart sounds and intact distal pulses.  Exam reveals no gallop and no friction rub.   No murmur heard.      Irregularly irregular rhythm  Pulmonary/Chest: Effort normal and breath sounds normal. No respiratory distress. She has no wheezes. She has no rales. She exhibits no tenderness.       Chaperone present for entire exam  Abdominal: Soft. Bowel  sounds are normal. She exhibits no distension and no mass. There is no tenderness.  Musculoskeletal: Normal range of motion. She exhibits no edema and no tenderness.       Spine with tenderness to palpation over the cervical spine and thoracic spine and lumbar spine. No obvious deformities or contusions  Lymphadenopathy:    She has no cervical adenopathy.  Neurological: She is alert. Coordination normal.       Bilateral lower extremities with slight weakness bilaterally, follows commands without difficulty, normal sensation of the lower extremities bilaterally.  Upper extremities with  normal grip and range of motion without signs of trauma, deformity or injury  Skin: Skin is warm and dry. No rash noted. No erythema.  Psychiatric: She has a normal mood and affect. Her behavior is normal.    ED Course  Procedures (including critical care time)  ED ECG REPORT   Date: 08/21/2011   Rate: 50  Rhythm: sinus bradycardia  QRS Axis: normal  Intervals: normal  ST/T Wave abnormalities: normal  Conduction Disutrbances:none  Narrative Interpretation:   Old EKG Reviewed: Compared with 11/15/2010, no significant changes    Labs Reviewed  CBC  DIFFERENTIAL  BASIC METABOLIC PANEL  DIGOXIN LEVEL  TROPONIN I  URINALYSIS, ROUTINE W REFLEX MICROSCOPIC   Dg Chest 1 View  08/21/2011  *RADIOLOGY REPORT*  Clinical Data: Seizures; upper back pain and headache.  CHEST - 1 VIEW  Comparison: Chest radiograph performed 11/15/2010  Findings: The lungs are well-aerated and clear.  There is no evidence of focal opacification, pleural effusion or pneumothorax.  The cardiomediastinal silhouette is within normal limits.  No acute osseous abnormalities are seen.  IMPRESSION: No acute cardiopulmonary process seen.  Original Report Authenticated By: Tonia Ghent, M.D.   Dg Thoracic Spine W/swimmers  08/21/2011  *RADIOLOGY REPORT*  Clinical Data: Seizures; upper back pain.  THORACIC SPINE - 2 VIEW + SWIMMERS  Comparison: Chest radiograph performed 11/15/2010  Findings: There is no evidence of fracture or subluxation. Vertebral bodies demonstrate normal height and alignment. Intervertebral disc spaces are preserved.  The visualized portions of both lungs are clear.  The mediastinum is unremarkable in appearance.  IMPRESSION: No evidence of fracture or subluxation along the thoracic spine.  Original Report Authenticated By: Tonia Ghent, M.D.   Dg Lumbar Spine Complete  08/21/2011  *RADIOLOGY REPORT*  Clinical Data: Seizures; lower back pain.  LUMBAR SPINE - COMPLETE 4+ VIEW  Comparison: None.   Findings: There is no evidence of fracture or subluxation. Vertebral bodies demonstrate normal height and alignment. Intervertebral disc spaces are preserved.  The visualized neural foramina are grossly unremarkable in appearance.  The visualized bowel gas pattern is unremarkable in appearance; air and stool are noted within the colon.  The sacroiliac joints are within normal limits.  IMPRESSION: No evidence of fracture or subluxation along the lumbar spine.  Original Report Authenticated By: Tonia Ghent, M.D.   Ct Head Wo Contrast  08/21/2011  *RADIOLOGY REPORT*  Clinical Data:  Status post fall; seizures.  Concern for head or cervical spine injury.  CT HEAD WITHOUT CONTRAST AND CT CERVICAL SPINE WITHOUT CONTRAST  Technique:  Multidetector CT imaging of the head and cervical spine was performed following the standard protocol without intravenous contrast.  Multiplanar CT image reconstructions of the cervical spine were also generated.  Comparison: CT of the head performed 09/30/2009  CT HEAD  Findings: There is no evidence of acute infarction, mass lesion, or intra- or extra-axial hemorrhage on CT.  The posterior fossa,  including the cerebellum, brainstem and fourth ventricle, is within normal limits.  The third and lateral ventricles, and basal ganglia are unremarkable in appearance.  The cerebral hemispheres are symmetric in appearance, with normal gray- white differentiation.  No mass effect or midline shift is seen.  There is no evidence of fracture; visualized osseous structures are unremarkable in appearance.  The visualized portions of the orbits are within normal limits.  The paranasal sinuses and mastoid air cells are well-aerated.  No significant soft tissue abnormalities are seen.  IMPRESSION: No evidence of traumatic intracranial injury or fracture.  CT CERVICAL SPINE  Findings: There is no evidence of fracture or subluxation.  Mild reversal of the normal lordotic curve of the cervical spine is  likely positional in nature.  Vertebral bodies demonstrate normal height and alignment.  Intervertebral disc spaces are preserved. Prevertebral soft tissues are within normal limits.  The visualized neural foramina are grossly unremarkable.  The thyroid gland is unremarkable in appearance.  The visualized lung apices are clear.  No significant soft tissue abnormalities are seen.  IMPRESSION: No evidence of fracture or subluxation along the cervical spine.  Original Report Authenticated By: Tonia Ghent, M.D.   Ct Cervical Spine Wo Contrast  08/21/2011  *RADIOLOGY REPORT*  Clinical Data:  Status post fall; seizures.  Concern for head or cervical spine injury.  CT HEAD WITHOUT CONTRAST AND CT CERVICAL SPINE WITHOUT CONTRAST  Technique:  Multidetector CT imaging of the head and cervical spine was performed following the standard protocol without intravenous contrast.  Multiplanar CT image reconstructions of the cervical spine were also generated.  Comparison: CT of the head performed 09/30/2009  CT HEAD  Findings: There is no evidence of acute infarction, mass lesion, or intra- or extra-axial hemorrhage on CT.  The posterior fossa, including the cerebellum, brainstem and fourth ventricle, is within normal limits.  The third and lateral ventricles, and basal ganglia are unremarkable in appearance.  The cerebral hemispheres are symmetric in appearance, with normal gray- white differentiation.  No mass effect or midline shift is seen.  There is no evidence of fracture; visualized osseous structures are unremarkable in appearance.  The visualized portions of the orbits are within normal limits.  The paranasal sinuses and mastoid air cells are well-aerated.  No significant soft tissue abnormalities are seen.  IMPRESSION: No evidence of traumatic intracranial injury or fracture.  CT CERVICAL SPINE  Findings: There is no evidence of fracture or subluxation.  Mild reversal of the normal lordotic curve of the cervical spine  is likely positional in nature.  Vertebral bodies demonstrate normal height and alignment.  Intervertebral disc spaces are preserved. Prevertebral soft tissues are within normal limits.  The visualized neural foramina are grossly unremarkable.  The thyroid gland is unremarkable in appearance.  The visualized lung apices are clear.  No significant soft tissue abnormalities are seen.  IMPRESSION: No evidence of fracture or subluxation along the cervical spine.  Original Report Authenticated By: Tonia Ghent, M.D.     1. Syncope       MDM  Rule out seizure, head injury, spinal injury, metabolic workup, digoxin level. Patient has improving mental status, clear sensorium but has slight disorientation and is unable to remember the name of one of her children. There is no focal traumatic injuries visualized on clinical exam, moving all extremities x4 with only slight weakness of the bilateral lower extremities. She is able to hold both legs up against resistance.   According to the medical record review,  the patient has had frequent cardiology visits, is on digoxin for her atrial fibrillation which is paroxysmal and has been diagnosed with recurrent neurocardiogenic syncope. She has had an echocardiogram in 2009 showing ejection fraction of 50%   Results reviewed showing normal troponin, normal basic metabolic panel and CBC. Digoxin level is normal, CT scan of the head and C-spine show no signs of fracture and there is no fractures of the thoracic or lumbar spines either. Chest x-ray negative.  The patient has been ambulatory, requesting discharge stating that she feels fine and wants to go home, she refuses to take a by mouth trial. She has normal mental status, has normal neurologic exam at this time, we'll discharge home. Precautions given for followup  Vida Roller, MD 08/21/11 [phone]

## 2011-08-21 NOTE — Discharge Instructions (Signed)
Your CAT scans, x-rays and blood tests are all normal, please call your doctor in the morning for a followup exam within 24-48 hours. Return to the hospital for severe or worsening symptoms including severe headaches, vomiting, dizziness or recurrent fainting spells.  RESOURCE GUIDE  Dental Problems  Patients with Medicaid: [facility] [phone]                                           [phone] [address] Phone:  [phone]                                                  Phone:  [phone]  If unable to pay or uninsured, contact:  [facility] or [facility]. to become qualified for the adult dental clinic.  Chronic Pain Problems Contact [facility]  [phone] Patients need to be referred by their primary care doctor.  Insufficient Money for Medicine Contact [employer]:  call "211" or [facility] [phone].  No Primary Care Doctor Call [facility]  [phone] Other agencies that provide inexpensive medical care    [facility]  [phone]    [facility]  [phone]    [facility]  [phone]    [facility]  [phone]    [facility]  [phone]    [facility]  [phone]  Psychological Services [facility]  [phone] [facility]  [phone] [facility]   [phone] (emergency services [phone])  Substance Abuse Resources [facility]  [phone] [facility] [phone] [facility] [phone] Floydene Flock [phone] Residential & Outpatient Substance Abuse Program  [phone]  Abuse/Neglect [facility] Child Abuse Hotline 610-630-2748 [facility] Child Abuse Hotline [phone] (After Hours)  Emergency Shelter [facility] [phone]  M[facility] [phone] [facility] [phone]  MRSA Hotline #:   (986)613-4838    [facility] Resources  [facility] [place]     [employer]                          [facility] [address] [place]                       [address]      [address]  [place]                                                Cristobal Goldmann Phone:  [phone]  Phone:  [phone]                 Phone:  [phone]  [facility] Phone:  [phone]  [place] Child Abuse Hotline (336) 342-1394 [phone] (After Hours)   
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
## 2011-09-14 ENCOUNTER — Other Ambulatory Visit: Payer: Self-pay | Admitting: Internal Medicine

## 2011-09-18 ENCOUNTER — Other Ambulatory Visit: Payer: Self-pay

## 2011-09-18 NOTE — Telephone Encounter (Signed)
If she is almost out, you can refill x 1, but she has to come for an office visit. She will need to have labs checked and that sort of thing.

## 2011-09-19 MED ORDER — SOTALOL HCL 120 MG PO TABS: 120.0000 mg | ORAL_TABLET | Freq: Two times a day (BID) | ORAL | Status: DC

## 2011-09-20 ENCOUNTER — Encounter: Payer: Self-pay | Admitting: *Deleted

## 2011-09-24 ENCOUNTER — Encounter (HOSPITAL_COMMUNITY): Payer: Self-pay | Admitting: *Deleted

## 2011-09-24 ENCOUNTER — Emergency Department (HOSPITAL_COMMUNITY)
Admission: EM | Admit: 2011-09-24 | Discharge: 2011-09-24 | Disposition: A | Discharge status: Home / Self Care | Payer: Self-pay | Attending: Emergency Medicine | Admitting: Emergency Medicine

## 2011-09-24 DIAGNOSIS — I4891 Unspecified atrial fibrillation: Secondary | ICD-10-CM | POA: Insufficient documentation

## 2011-09-24 DIAGNOSIS — F3289 Other specified depressive episodes: Secondary | ICD-10-CM | POA: Insufficient documentation

## 2011-09-24 DIAGNOSIS — R0602 Shortness of breath: Secondary | ICD-10-CM | POA: Insufficient documentation

## 2011-09-24 DIAGNOSIS — K219 Gastro-esophageal reflux disease without esophagitis: Secondary | ICD-10-CM | POA: Insufficient documentation

## 2011-09-24 DIAGNOSIS — J45909 Unspecified asthma, uncomplicated: Secondary | ICD-10-CM | POA: Insufficient documentation

## 2011-09-24 DIAGNOSIS — F329 Major depressive disorder, single episode, unspecified: Secondary | ICD-10-CM | POA: Insufficient documentation

## 2011-09-24 DIAGNOSIS — F17200 Nicotine dependence, unspecified, uncomplicated: Secondary | ICD-10-CM | POA: Insufficient documentation

## 2011-09-24 LAB — RAPID URINE DRUG SCREEN, HOSP PERFORMED
Amphetamines: NOT DETECTED
Benzodiazepines: NOT DETECTED
Opiates: POSITIVE — AB
Tetrahydrocannabinol: POSITIVE — AB

## 2011-09-24 MED ORDER — IPRATROPIUM BROMIDE 0.02 % IN SOLN
0.5000 mg | Freq: Once | RESPIRATORY_TRACT | Status: AC
  Administered 2011-09-24: 0.5 mg via RESPIRATORY_TRACT

## 2011-09-24 MED ORDER — IPRATROPIUM BROMIDE 0.02 % IN SOLN
RESPIRATORY_TRACT | Status: AC
  Filled 2011-09-24: qty 2.5

## 2011-09-24 MED ORDER — PREDNISONE 50 MG PO TABS: 50.0000 mg | ORAL_TABLET | Freq: Every day | ORAL | Status: DC

## 2011-09-24 MED ORDER — ALBUTEROL SULFATE (5 MG/ML) 0.5% IN NEBU
5.0000 mg | INHALATION_SOLUTION | Freq: Once | RESPIRATORY_TRACT | Status: AC
  Administered 2011-09-24: 5 mg via RESPIRATORY_TRACT

## 2011-09-24 MED ORDER — ALBUTEROL SULFATE HFA 108 (90 BASE) MCG/ACT IN AERS
1.0000 | INHALATION_SPRAY | Freq: Four times a day (QID) | RESPIRATORY_TRACT | Status: DC | PRN
Start: 2011-09-24 — End: 2012-02-02

## 2011-09-24 MED ORDER — ALBUTEROL SULFATE (5 MG/ML) 0.5% IN NEBU
INHALATION_SOLUTION | RESPIRATORY_TRACT | Status: AC
  Filled 2011-09-24: qty 1

## 2011-09-24 MED ORDER — DEXAMETHASONE SODIUM PHOSPHATE 10 MG/ML IJ SOLN
10.0000 mg | Freq: Once | INTRAMUSCULAR | Status: AC
  Administered 2011-09-24: 10 mg via INTRAMUSCULAR
  Filled 2011-09-24: qty 1

## 2011-09-24 NOTE — Discharge Instructions (Signed)
Asthma Attack Prevention HOW CAN ASTHMA BE PREVENTED? Currently, there is no way to prevent asthma from starting. However, you can take steps to control the disease and prevent its symptoms after you have been diagnosed. Learn about your asthma and how to control it. Take an active role to control your asthma by working with your caregiver to create and follow an asthma action plan. An asthma action plan guides you in taking your medicines properly, avoiding factors that make your asthma worse, tracking your level of asthma control, responding to worsening asthma, and seeking emergency care when needed. To track your asthma, keep records of your symptoms, check your peak flow number using a peak flow meter (handheld device that shows how well air moves out of your lungs), and get regular asthma checkups.  Other ways to prevent asthma attacks include:  Use medicines as your caregiver directs.   Identify and avoid things that make your asthma worse (as much as you can).   Keep track of your asthma symptoms and level of control.   Get regular checkups for your asthma.   With your caregiver, write a detailed plan for taking medicines and managing an asthma attack. Then be sure to follow your action plan. Asthma is an ongoing condition that needs regular monitoring and treatment.   Identify and avoid asthma triggers. A number of outdoor allergens and irritants (pollen, mold, cold air, air pollution) can trigger asthma attacks. Find out what causes or makes your asthma worse, and take steps to avoid those triggers (see below).   Monitor your breathing. Learn to recognize warning signs of an attack, such as slight coughing, wheezing or shortness of breath. However, your lung function may already decrease before you notice any signs or symptoms, so regularly measure and record your peak airflow with a home peak flow meter.   Identify and treat attacks early. If you act quickly, you're less likely to have  a severe attack. You will also need less medicine to control your symptoms. When your peak flow measurements decrease and alert you to an upcoming attack, take your medicine as instructed, and immediately stop any activity that may have triggered the attack. If your symptoms do not improve, get medical help.   Pay attention to increasing quick-relief inhaler use. If you find yourself relying on your quick-relief inhaler (such as albuterol), your asthma is not under control. See your caregiver about adjusting your treatment.  IDENTIFY AND CONTROL FACTORS THAT MAKE YOUR ASTHMA WORSE A number of common things can set off or make your asthma symptoms worse (asthma triggers). Keep track of your asthma symptoms for several weeks, detailing all the environmental and emotional factors that are linked with your asthma. When you have an asthma attack, go back to your asthma diary to see which factor, or combination of factors, might have contributed to it. Once you know what these factors are, you can take steps to control many of them.  Allergies: If you have allergies and asthma, it is important to take asthma prevention steps at home. Asthma attacks (worsening of asthma symptoms) can be triggered by allergies, which can cause temporary increased inflammation of your airways. Minimizing contact with the substance to which you are allergic will help prevent an asthma attack. Animal Dander:   Some people are allergic to the flakes of skin or dried saliva from animals with fur or feathers. Keep these pets out of your home.   If you can't keep a pet outdoors, keep the   pet out of your bedroom and other sleeping areas at all times, and keep the door closed.   Remove carpets and furniture covered with cloth from your home. If that is not possible, keep the pet away from fabric-covered furniture and carpets.  Dust Mites:  Many people with asthma are allergic to dust mites. Dust mites are tiny bugs that are found in  every home, in mattresses, pillows, carpets, fabric-covered furniture, bedcovers, clothes, stuffed toys, fabric, and other fabric-covered items.   Cover your mattress in a special dust-proof cover.   Cover your pillow in a special dust-proof cover, or wash the pillow each week in hot water. Water must be hotter than 130 F to kill dust mites. Cold or warm water used with detergent and bleach can also be effective.   Wash the sheets and blankets on your bed each week in hot water.   Try not to sleep or lie on cloth-covered cushions.   Call ahead when traveling and ask for a smoke-free hotel room. Bring your own bedding and pillows, in case the hotel only supplies feather pillows and down comforters, which may contain dust mites and cause asthma symptoms.   Remove carpets from your bedroom and those laid on concrete, if you can.   Keep stuffed toys out of the bed, or wash the toys weekly in hot water or cooler water with detergent and bleach.  Cockroaches:  Many people with asthma are allergic to the droppings and remains of cockroaches.   Keep food and garbage in closed containers. Never leave food out.   Use poison baits, traps, powders, gels, or paste (for example, boric acid).   If a spray is used to kill cockroaches, stay out of the room until the odor goes away.  Indoor Mold:  Fix leaky faucets, pipes, or other sources of water that have mold around them.   Clean moldy surfaces with a cleaner that has bleach in it.  Pollen and Outdoor Mold:  When pollen or mold spore counts are high, try to keep your windows closed.   Stay indoors with windows closed from late morning to afternoon, if you can. Pollen and some mold spore counts are highest at that time.   Ask your caregiver whether you need to take or increase anti-inflammatory medicine before your allergy season starts.  Irritants:   Tobacco smoke is an irritant. If you smoke, ask your caregiver how you can quit. Ask family  members to quit smoking, too. Do not allow smoking in your home or car.   If possible, do not use a wood-burning stove, kerosene heater, or fireplace. Minimize exposure to all sources of smoke, including incense, candles, fires, and fireworks.   Try to stay away from strong odors and sprays, such as perfume, talcum powder, hair spray, and paints.   Decrease humidity in your home and use an indoor air cleaning device. Reduce indoor humidity to below 60 percent. Dehumidifiers or central air conditioners can do this.   Try to have someone else vacuum for you once or twice a week, if you can. Stay out of rooms while they are being vacuumed and for a short while afterward.   If you vacuum, use a dust mask from a hardware store, a double-layered or microfilter vacuum cleaner bag, or a vacuum cleaner with a HEPA filter.   Sulfites in foods and beverages can be irritants. Do not drink beer or wine, or eat dried fruit, processed potatoes, or shrimp if they cause asthma   symptoms.   Cold air can trigger an asthma attack. Cover your nose and mouth with a scarf on cold or windy days.   Several health conditions can make asthma more difficult to manage, including runny nose, sinus infections, reflux disease, psychological stress, and sleep apnea. Your caregiver will treat these conditions, as well.   Avoid close contact with people who have a cold or the flu, since your asthma symptoms may get worse if you catch the infection from them. Wash your hands thoroughly after touching items that may have been handled by people with a respiratory infection.   Get a flu shot every year to protect against the flu virus, which often makes asthma worse for days or weeks. Also get a pneumonia shot once every five to 10 years.  Drugs:  Aspirin and other painkillers can cause asthma attacks. 10% to 20% of people with asthma have sensitivity to aspirin or a group of painkillers called non-steroidal anti-inflammatory drugs  (NSAIDS), such as ibuprofen and naproxen. These drugs are used to treat pain and reduce fevers. Asthma attacks caused by any of these medicines can be severe and even fatal. These drugs must be avoided in people who have known aspirin sensitive asthma. Products with acetaminophen are considered safe for people who have asthma. It is important that people with aspirin sensitivity read labels of all over-the-counter drugs used to treat pain, colds, coughs, and fever.   Beta blockers and ACE inhibitors are other drugs which you should discuss with your caregiver, in relation to your asthma.  ALLERGY SKIN TESTING  Ask your asthma caregiver about allergy skin testing or blood testing (RAST test) to identify the allergens to which you are sensitive. If you are found to have allergies, allergy shots (immunotherapy) for asthma may help prevent future allergies and asthma. With allergy shots, small doses of allergens (substances to which you are allergic) are injected under your skin on a regular schedule. Over a period of time, your body may become used to the allergen and less responsive with asthma symptoms. You can also take measures to minimize your exposure to those allergens. EXERCISE  If you have exercise-induced asthma, or are planning vigorous exercise, or exercise in cold, humid, or dry environments, prevent exercise-induced asthma by following your caregiver's advice regarding asthma treatment before exercising. Document Released: 05/09/2009 Document Revised: 05/10/2011 Document Reviewed: 05/09/2009 [employer] Patient Information 2012 [employer]. 

## 2011-09-24 NOTE — ED Notes (Signed)
Pt states that she went to sleep last night SOB and woke up with wheezing and SOB.

## 2011-09-24 NOTE — ED Provider Notes (Signed)
History     CSN: [id number]  Arrival date & time 09/24/11  0422   First MD Initiated Contact with Patient 09/24/11 0430      Chief Complaint  Patient presents with  . Shortness of Breath    (Consider location/radiation/quality/duration/timing/severity/associated sxs/prior treatment) Patient is a 26 y.o. female presenting with wheezing. The history is provided by the patient.  Wheezing  The current episode started today. The onset was sudden. The problem occurs continuously. The problem has been unchanged. The problem is moderate. The symptoms are relieved by nothing. The symptoms are aggravated by nothing. Associated symptoms include wheezing. Pertinent negatives include no chest pain, no chest pressure, no orthopnea, no fever, no rhinorrhea, no sore throat, no stridor and no cough. There was no intake of a foreign body. She has had no prior hospitalizations. She has had no prior ICU admissions. She has had no prior intubations. She has been behaving normally. Urine output has been normal. The last void occurred less than 6 hours ago.    Past Medical History  Diagnosis Date  . Asthma   . Atrial fibrillation     LV function lower limit of normal   . GERD (gastroesophageal reflux disease)   . Depression     History reviewed. No pertinent past surgical history.  Family History  Problem Relation Age of Onset  . Asthma Brother     History  Substance Use Topics  . Smoking status: Current Everyday Smoker    Types: Cigarettes  . Smokeless tobacco: Not on file   Comment: since 2002; smokes about 8 cig/day   . Alcohol Use: Yes     weekends     OB History    Grav Para Term Preterm Abortions TAB SAB Ect Mult Living                  Review of Systems  Constitutional: Negative for fever.  HENT: Negative for sore throat and rhinorrhea.   Respiratory: Positive for wheezing. Negative for cough and stridor.   Cardiovascular: Negative for chest pain, palpitations, orthopnea and  leg swelling.  All other systems reviewed and are negative.    Allergies  Review of patient's allergies indicates no known allergies.  Home Medications   Current Outpatient Rx  Name Route Sig Dispense Refill  . DIGOXIN 0.25 MG PO TABS Oral Take 250 mcg by mouth daily.    . IBUPROFEN 200 MG PO TABS Oral Take 800 mg by mouth every 6 (six) hours as needed. For pain    . SOTALOL HCL 120 MG PO TABS Oral Take 120 mg by mouth 2 (two) times daily.      BP 109/67  Pulse 68  Temp(Src) 97.6 F (36.4 C) (Oral)  Resp 18  SpO2 96%  Physical Exam  Constitutional: She appears well-developed and well-nourished. No distress.  HENT:  Head: Normocephalic and atraumatic.  Right Ear: External ear normal.  Mouth/Throat: Oropharynx is clear and moist.  Eyes: EOM are normal.       Pinpoint pupils  Neck: Normal range of motion. Neck supple.  Cardiovascular: Normal rate and regular rhythm.   Pulmonary/Chest: No stridor. She has wheezes.  Abdominal: Soft. Bowel sounds are normal. There is no tenderness. There is no rebound and no guarding.  Musculoskeletal: Normal range of motion. She exhibits no edema and no tenderness.  Neurological: She is alert. She has normal reflexes.  Skin: Skin is warm and dry.  Psychiatric: She has a normal mood and affect.  ED Course  Procedures (including critical care time)   Labs Reviewed  URINE RAPID DRUG SCREEN (HOSP PERFORMED)   No results found.   No diagnosis found.   PERC negative MDM   Date: 09/24/2011  Rate: 59  Rhythm: sinus arrhythmia  QRS Axis: normal  Intervals: normal  ST/T Wave abnormalities: normal  Conduction Disutrbances:none  Narrative Interpretation:   Old EKG Reviewed: none available     Lungs clear sats 100% and patient feels 100% improved following neb    Annette Bertelson K Neythan Kozlov-Rasch, MD 09/24/11 [phone]

## 2011-10-05 ENCOUNTER — Other Ambulatory Visit: Payer: Self-pay | Admitting: Internal Medicine

## 2011-10-06 ENCOUNTER — Other Ambulatory Visit: Payer: Self-pay | Admitting: Internal Medicine

## 2011-10-08 ENCOUNTER — Other Ambulatory Visit: Payer: Self-pay | Admitting: *Deleted

## 2011-10-08 MED ORDER — DIGOXIN 250 MCG PO TABS: 0.2500 mg | ORAL_TABLET | Freq: Every day | ORAL | Status: DC

## 2011-10-08 NOTE — Telephone Encounter (Signed)
Refilled digoxin 

## 2011-12-06 ENCOUNTER — Emergency Department (HOSPITAL_COMMUNITY)
Admission: EM | Admit: 2011-12-06 | Discharge: 2011-12-06 | Disposition: A | Discharge status: Home Health | Payer: Self-pay | Attending: Emergency Medicine | Admitting: Emergency Medicine

## 2011-12-06 ENCOUNTER — Emergency Department (HOSPITAL_COMMUNITY): Payer: Self-pay

## 2011-12-06 ENCOUNTER — Encounter (HOSPITAL_COMMUNITY): Payer: Self-pay | Admitting: *Deleted

## 2011-12-06 DIAGNOSIS — N200 Calculus of kidney: Secondary | ICD-10-CM

## 2011-12-06 DIAGNOSIS — N949 Unspecified condition associated with female genital organs and menstrual cycle: Secondary | ICD-10-CM | POA: Insufficient documentation

## 2011-12-06 DIAGNOSIS — N898 Other specified noninflammatory disorders of vagina: Secondary | ICD-10-CM | POA: Insufficient documentation

## 2011-12-06 DIAGNOSIS — F3289 Other specified depressive episodes: Secondary | ICD-10-CM | POA: Insufficient documentation

## 2011-12-06 DIAGNOSIS — J45909 Unspecified asthma, uncomplicated: Secondary | ICD-10-CM | POA: Insufficient documentation

## 2011-12-06 DIAGNOSIS — R112 Nausea with vomiting, unspecified: Secondary | ICD-10-CM | POA: Insufficient documentation

## 2011-12-06 DIAGNOSIS — R109 Unspecified abdominal pain: Secondary | ICD-10-CM | POA: Insufficient documentation

## 2011-12-06 DIAGNOSIS — N760 Acute vaginitis: Secondary | ICD-10-CM

## 2011-12-06 DIAGNOSIS — I4891 Unspecified atrial fibrillation: Secondary | ICD-10-CM | POA: Insufficient documentation

## 2011-12-06 DIAGNOSIS — A599 Trichomoniasis, unspecified: Secondary | ICD-10-CM

## 2011-12-06 DIAGNOSIS — K219 Gastro-esophageal reflux disease without esophagitis: Secondary | ICD-10-CM | POA: Insufficient documentation

## 2011-12-06 DIAGNOSIS — F329 Major depressive disorder, single episode, unspecified: Secondary | ICD-10-CM | POA: Insufficient documentation

## 2011-12-06 DIAGNOSIS — B9689 Other specified bacterial agents as the cause of diseases classified elsewhere: Secondary | ICD-10-CM

## 2011-12-06 LAB — CBC WITH DIFFERENTIAL/PLATELET
Basophils Relative: 2 % — ABNORMAL HIGH (ref 0–1)
Eosinophils Absolute: 0.6 10*3/uL (ref 0.0–0.7)
Eosinophils Relative: 5 % (ref 0–5)
HCT: 44.4 % (ref 36.0–46.0)
Hemoglobin: 15.4 g/dL — ABNORMAL HIGH (ref 12.0–15.0)
Lymphs Abs: 3.1 10*3/uL (ref 0.7–4.0)
MCH: 30.7 pg (ref 26.0–34.0)
MCHC: 34.7 g/dL (ref 30.0–36.0)
MCV: 88.4 fL (ref 78.0–100.0)
Monocytes Absolute: 1 10*3/uL (ref 0.1–1.0)
Neutro Abs: 6.2 10*3/uL (ref 1.7–7.7)
Neutrophils Relative %: 56 % (ref 43–77)

## 2011-12-06 LAB — URINALYSIS, ROUTINE W REFLEX MICROSCOPIC
Bilirubin Urine: NEGATIVE
Glucose, UA: NEGATIVE mg/dL
Ketones, ur: NEGATIVE mg/dL
Protein, ur: NEGATIVE mg/dL

## 2011-12-06 LAB — BASIC METABOLIC PANEL
BUN: 11 mg/dL (ref 6–23)
CO2: 23 mEq/L (ref 19–32)
Calcium: 9.3 mg/dL (ref 8.4–10.5)
GFR calc non Af Amer: >90 mL/min (ref >90)
Glucose, Bld: 108 mg/dL — ABNORMAL HIGH (ref 70–99)

## 2011-12-06 LAB — URINE MICROSCOPIC-ADD ON

## 2011-12-06 LAB — WET PREP, GENITAL: Yeast Wet Prep HPF POC: NONE SEEN

## 2011-12-06 MED ORDER — OXYCODONE-ACETAMINOPHEN 5-325 MG PO TABS: 2.0000 | ORAL_TABLET | ORAL | Status: AC | PRN

## 2011-12-06 MED ORDER — IBUPROFEN 800 MG PO TABS: 800.0000 mg | ORAL_TABLET | Freq: Three times a day (TID) | ORAL | Status: AC

## 2011-12-06 MED ORDER — KETOROLAC TROMETHAMINE 30 MG/ML IJ SOLN
30.0000 mg | Freq: Once | INTRAMUSCULAR | Status: AC
  Administered 2011-12-06: 30 mg via INTRAVENOUS
  Filled 2011-12-06: qty 1

## 2011-12-06 MED ORDER — HYDROMORPHONE HCL PF 1 MG/ML IJ SOLN
1.0000 mg | Freq: Once | INTRAMUSCULAR | Status: AC
  Administered 2011-12-06: 1 mg via INTRAVENOUS
  Filled 2011-12-06: qty 1

## 2011-12-06 MED ORDER — METRONIDAZOLE 500 MG PO TABS
ORAL_TABLET | ORAL | Status: AC
  Administered 2011-12-06: 500 mg via ORAL
  Filled 2011-12-06: qty 4

## 2011-12-06 MED ORDER — METRONIDAZOLE 500 MG PO TABS
2000.0000 mg | ORAL_TABLET | Freq: Once | ORAL | Status: AC
  Administered 2011-12-06: 500 mg via ORAL

## 2011-12-06 MED ORDER — ONDANSETRON HCL 4 MG PO TABS: 4.0000 mg | ORAL_TABLET | Freq: Four times a day (QID) | ORAL | Status: AC

## 2011-12-06 MED ORDER — ONDANSETRON HCL 4 MG/2ML IJ SOLN
4.0000 mg | Freq: Once | INTRAMUSCULAR | Status: AC
  Administered 2011-12-06: 4 mg via INTRAVENOUS
  Filled 2011-12-06: qty 2

## 2011-12-06 MED ORDER — SODIUM CHLORIDE 0.9 % IV BOLUS (SEPSIS)
1000.0000 mL | Freq: Once | INTRAVENOUS | Status: AC
  Administered 2011-12-06: 1000 mL via INTRAVENOUS

## 2011-12-06 NOTE — ED Notes (Signed)
Patient with left flank pain radiating into abdomen

## 2011-12-06 NOTE — ED Provider Notes (Signed)
History   This chart was scribed for Glynn Octave, MD by Toya Smothers. The patient was seen in room TR04C/TR04C. Patient's care was started at 1115.  CSN: [id number]  Arrival date & time 12/06/11  1115   None    Chief Complaint  Patient presents with  . Flank Pain   The history is provided by the patient. No language interpreter was used.    [name] is a 26 y.o. female who presents to the Emergency Department complaining of sudden onset moderate severe constant pelvic onset 30 minuets ago without injury. Pt reports associate vaginal bleeding, vaginal discharge described as spotting, nausea, and emesis (10 min ago). Pt has not treated symptoms prior to arrival to emergency department. Pt denies fever and hematuria. Denies history of kidney stones.   Past Medical History  Diagnosis Date  . Asthma   . Atrial fibrillation     LV function lower limit of normal   . GERD (gastroesophageal reflux disease)   . Depression     History reviewed. No pertinent past surgical history.  Family History  Problem Relation Age of Onset  . Asthma Brother     History  Substance Use Topics  . Smoking status: Current Everyday Smoker    Types: Cigarettes  . Smokeless tobacco: Not on file   Comment: since 2002; smokes about 8 cig/day   . Alcohol Use: Yes     weekends    Review of Systems  Constitutional: Negative for fever.  HENT: Negative for rhinorrhea.   Eyes: Negative for pain.  Respiratory: Negative for cough and shortness of breath.   Cardiovascular: Negative for chest pain.  Gastrointestinal: Negative for nausea, vomiting, abdominal pain and diarrhea.  Genitourinary: Positive for flank pain, vaginal bleeding, vaginal discharge and pelvic pain. Negative for dysuria and hematuria.  Musculoskeletal: Negative for back pain.  Skin: Negative for rash.  Neurological: Negative for weakness and headaches.    Allergies  Review of patient's allergies indicates no known  allergies.  Home Medications   Current Outpatient Rx  Name Route Sig Dispense Refill  . ALBUTEROL SULFATE HFA 108 (90 BASE) MCG/ACT IN AERS Inhalation Inhale 1-2 puffs into the lungs every 6 (six) hours as needed for wheezing. 1 Inhaler 0  . DIGOXIN 0.25 MG PO TABS Oral Take 1 tablet (0.25 mg total) by mouth daily. 30 tablet 1    Patient needs to call for appointment  . SOTALOL HCL 120 MG PO TABS Oral Take 120 mg by mouth 2 (two) times daily.      BP 143/107  Pulse 112  Temp 98.4 F (36.9 C) (Oral)  Resp 16  SpO2 98%  LMP 12/06/2011  Physical Exam  Nursing note and vitals reviewed. Constitutional: She is oriented to person, place, and time. She appears well-developed and well-nourished. She appears distressed.       Uncomfortable. Distressed. Crying.  HENT:  Head: Normocephalic and atraumatic.  Eyes: EOM are normal. Pupils are equal, round, and reactive to light.  Neck: Neck supple. No tracheal deviation present.  Cardiovascular: Normal rate.   Pulmonary/Chest: Effort normal. No respiratory distress.  Abdominal: Soft. She exhibits no distension. There is no tenderness.       Mid LLQ tenderness.  Genitourinary: There is no tenderness on the right labia. There is no tenderness on the left labia. Cervix exhibits friability. Cervix exhibits no motion tenderness and no discharge. Right adnexum displays no mass and no tenderness. Left adnexum displays no mass and no tenderness. There  is bleeding around the vagina. No vaginal discharge found.  Musculoskeletal: Normal range of motion. She exhibits no edema.       L CVA tenderness.  Neurological: She is alert and oriented to person, place, and time. No cranial nerve deficit or sensory deficit. Coordination normal.  Skin: Skin is warm and dry.  Psychiatric: She has a normal mood and affect. Her behavior is normal.    ED Course  Procedures (including critical care time) DIAGNOSTIC STUDIES: Oxygen Saturation is 98% on room air, normal  by my interpretation.    COORDINATION OF CARE: 1128- Evaluated Pt present health.Ordered pain medication, lab work, and radiology. 1222- Rechecked Pt. Pt remains distressed.  1233- Discussed diagnosis of 4mm kidney stone and advised to follow up with a urologist.    Labs Reviewed  URINALYSIS, ROUTINE W REFLEX MICROSCOPIC - Abnormal; Notable for the following:    Hgb urine dipstick TRACE (*)     Leukocytes, UA TRACE (*)     All other components within normal limits  CBC WITH DIFFERENTIAL - Abnormal; Notable for the following:    WBC 11.1 (*)     Hemoglobin 15.4 (*)     All other components within normal limits  PREGNANCY, URINE  URINE MICROSCOPIC-ADD ON  BASIC METABOLIC PANEL  GC/CHLAMYDIA PROBE AMP, GENITAL  WET PREP, GENITAL   Ct Abdomen Pelvis Wo Contrast  12/06/2011  *RADIOLOGY REPORT*  Clinical Data: Left lower quadrant pain, microscopic hematuria  CT ABDOMEN AND PELVIS WITHOUT CONTRAST  Technique:  Multidetector CT imaging of the abdomen and pelvis was performed following the standard protocol without intravenous contrast.  Comparison: None.  Findings: Several noncalcified lung nodules are present at the lung bases. In this age group an inflammatory process would be most likely with metastatic disease less likely.  The liver is unremarkable in the unenhanced state.  No calcified gallstones are seen.  The pancreas is normal in size and the pancreatic duct is not dilated.  The adrenal glands and spleen are unremarkable.  The stomach is not well distended.  No definite renal calculi are seen.  However, there is slight fullness of the left pelvocaliceal system and the left ureter also is somewhat prominent into the pelvis.  The distal left ureter is very difficult to distinguish from the adjacent bowel and uterus.  However I do believe there is a 4 mm calculus in the distal left ureter very near the left UV junction. The urinary bladder is decompressed.  The right ureter is grossly normal in  caliber.  The uterus is anteverted.  No adnexal lesion is seen.  No abnormality of the colon is seen.  The terminal ileum and appendix are unremarkable. Only a tiny amount of free fluid is noted within the pelvis.  IMPRESSION:  1.  Mild fullness of the left pelvocaliceal system and left ureter to an apparent point of obstruction by a 4 mm distal left ureteral calculus very near the left UV junction. 2.  No other renal calculi are seen. 3.  The appendix and terminal ileum appear normal.  Original Report Authenticated By: Juline Patch, M.D.     No diagnosis found.    MDM  L flank pain radiating into abdomen x 30 minutes that is constant, associated with nausea and vomiting. No history of kidney stones.  UA, HCG, labs, IVF, CT stone.  Urinalysis without infection.  Pelvic exam benign.  Obstructing UVJ calculus on CT. D/w Dr. Wilson Singer of urology who agrees to see patient next  week. Pain controlled on discharge.  I personally performed the services described in this documentation, which was scribed in my presence.  The recorded information has been reviewed and considered.     Glynn Octave, MD 12/06/11 1240

## 2011-12-06 NOTE — ED Notes (Signed)
Patient given fluid challenge, patient with n/v controlled at this time.

## 2011-12-08 LAB — GC/CHLAMYDIA PROBE AMP, GENITAL
Chlamydia, DNA Probe: NEGATIVE
GC Probe Amp, Genital: NEGATIVE

## 2012-02-02 ENCOUNTER — Encounter (HOSPITAL_COMMUNITY): Payer: Self-pay | Admitting: Emergency Medicine

## 2012-02-02 ENCOUNTER — Emergency Department (HOSPITAL_COMMUNITY)
Admission: EM | Admit: 2012-02-02 | Discharge: 2012-02-02 | Disposition: A | Discharge status: Home / Self Care | Payer: Self-pay | Attending: Emergency Medicine | Admitting: Emergency Medicine

## 2012-02-02 DIAGNOSIS — K047 Periapical abscess without sinus: Secondary | ICD-10-CM

## 2012-02-02 DIAGNOSIS — K089 Disorder of teeth and supporting structures, unspecified: Secondary | ICD-10-CM | POA: Insufficient documentation

## 2012-02-02 DIAGNOSIS — J45909 Unspecified asthma, uncomplicated: Secondary | ICD-10-CM | POA: Insufficient documentation

## 2012-02-02 DIAGNOSIS — F17200 Nicotine dependence, unspecified, uncomplicated: Secondary | ICD-10-CM | POA: Insufficient documentation

## 2012-02-02 DIAGNOSIS — K219 Gastro-esophageal reflux disease without esophagitis: Secondary | ICD-10-CM | POA: Insufficient documentation

## 2012-02-02 DIAGNOSIS — I4891 Unspecified atrial fibrillation: Secondary | ICD-10-CM | POA: Insufficient documentation

## 2012-02-02 MED ORDER — HYDROCODONE-ACETAMINOPHEN 5-325 MG PO TABS: 1.0000 | ORAL_TABLET | ORAL | Status: AC | PRN

## 2012-02-02 MED ORDER — PENICILLIN V POTASSIUM 250 MG PO TABS: 250.0000 mg | ORAL_TABLET | Freq: Four times a day (QID) | ORAL | Status: AC

## 2012-02-02 MED ORDER — BUPIVACAINE-EPINEPHRINE (PF) 0.5% -1:200000 IJ SOLN
INTRAMUSCULAR | Status: AC
  Filled 2012-02-02: qty 1.8

## 2012-02-02 MED ORDER — BUPIVACAINE-EPINEPHRINE PF 0.5-1:200000 % IJ SOLN
10.0000 mL | Freq: Once | INTRAMUSCULAR | Status: DC
  Filled 2012-02-02: qty 10

## 2012-02-02 MED ORDER — IBUPROFEN 400 MG PO TABS
600.0000 mg | ORAL_TABLET | Freq: Once | ORAL | Status: AC
  Administered 2012-02-02: 600 mg via ORAL
  Filled 2012-02-02: qty 1

## 2012-02-02 MED ORDER — IBUPROFEN 600 MG PO TABS: 600.0000 mg | ORAL_TABLET | Freq: Four times a day (QID) | ORAL | Status: AC | PRN

## 2012-02-02 MED ORDER — METRONIDAZOLE 500 MG PO TABS: 500.0000 mg | ORAL_TABLET | Freq: Two times a day (BID) | ORAL | Status: AC

## 2012-02-02 NOTE — ED Notes (Signed)
Pt c/o toothache to right lower tooth onset yesterday. Pt took two motrin PTA without relief.

## 2012-02-02 NOTE — ED Provider Notes (Signed)
History     CSN: [id number]  Arrival date & time 02/02/12  1310   First MD Initiated Contact with Patient 02/02/12 1351      Chief Complaint  Patient presents with  . Dental Pain    (Consider location/radiation/quality/duration/timing/severity/associated sxs/prior treatment) HPI Comments: Pt with hx of afib on digoxin, sotalol comes in with cc of dental pain. Pt states that she has hx of bad dentition, and started having some pain in the right side last night. No trauma. She had some bloody discharge, and the pain is worse with po intake.  She has no specific sensitivity to hot or cold, and there is no associated n/v/f/c. Pt does have some swelling.  Patient is a 26 y.o. female presenting with tooth pain. The history is provided by the patient.  Dental PainPrimary symptoms do not include headaches or shortness of breath.  Additional symptoms include: facial swelling and trouble swallowing. Additional symptoms do not include: drooling and ear pain.    Past Medical History  Diagnosis Date  . Asthma   . Atrial fibrillation     LV function lower limit of normal   . GERD (gastroesophageal reflux disease)   . Depression     Past Surgical History  Procedure Date  . Heart ablation     Family History  Problem Relation Age of Onset  . Asthma Brother     History  Substance Use Topics  . Smoking status: Current Everyday Smoker    Types: Cigarettes  . Smokeless tobacco: Not on file   Comment: since 2002; smokes about 8 cig/day   . Alcohol Use: Yes     weekends     OB History    Grav Para Term Preterm Abortions TAB SAB Ect Mult Living                  Review of Systems  Constitutional: Negative for activity change.  HENT: Positive for facial swelling, trouble swallowing and dental problem. Negative for ear pain, drooling, neck pain and tinnitus.   Respiratory: Negative for shortness of breath.   Cardiovascular: Negative for chest pain.  Gastrointestinal: Negative for  nausea, vomiting and abdominal pain.  Genitourinary: Negative for dysuria.  Neurological: Negative for headaches.    Allergies  Review of patient's allergies indicates no known allergies.  Home Medications   Current Outpatient Rx  Name Route Sig Dispense Refill  . ALBUTEROL SULFATE HFA 108 (90 BASE) MCG/ACT IN AERS Inhalation Inhale 1-2 puffs into the lungs every 6 (six) hours as needed. For wheezing    . DIGOXIN 0.25 MG PO TABS Oral Take 1 tablet (0.25 mg total) by mouth daily. 30 tablet 1    Patient needs to call for appointment  . SOTALOL HCL 120 MG PO TABS Oral Take 120 mg by mouth 2 (two) times daily.      BP 108/69  Pulse 56  Temp 98.2 F (36.8 C) (Oral)  Resp 16  SpO2 100%  LMP 02/02/2012  Physical Exam  Nursing note and vitals reviewed. Constitutional: She is oriented to person, place, and time. She appears well-developed and well-nourished.  HENT:  Head: Normocephalic and atraumatic.       Tooth #1 - there is some erosion of the bone, and just posteriorly, there are some exudative changes. Gingiva exam - minimal swelling on the buccal surface around tooth 1, no discharge, + tenderness. Pt has several teeth that have required dental work. Negative trismus. No cervical lymphadenopathy, or preauricular lymphadenopathy.  Eyes: EOM are normal. Pupils are equal, round, and reactive to light.  Neck: Neck supple.  Cardiovascular: Normal rate, regular rhythm and normal heart sounds.   No murmur heard. Pulmonary/Chest: Effort normal. No respiratory distress.  Abdominal: Soft. She exhibits no distension. There is no tenderness. There is no rebound and no guarding.  Neurological: She is alert and oriented to person, place, and time.  Skin: Skin is warm and dry.    ED Course  Procedures (including critical care time)  Labs Reviewed - No data to display No results found.   No diagnosis found.    MDM  DDx includes: - Periapical tooth infection - Dental abscess -  Gingivitis - Dental trauma - Pulpitis - Nerve root compression   Pt comes in with cc of toothache. No diabetes hx, no drooling, trismus, or signs of deep infection. Pt has no concerning systemic signs. Pt has no dentist to f/u - will provide resource.     Derwood Kaplan, MD 02/02/12 1406

## 2012-04-04 ENCOUNTER — Encounter (HOSPITAL_COMMUNITY): Payer: Self-pay | Admitting: *Deleted

## 2012-04-04 ENCOUNTER — Inpatient Hospital Stay (HOSPITAL_COMMUNITY)
Admission: AD | Admit: 2012-04-04 | Discharge: 2012-04-04 | Disposition: A | Discharge status: Home / Self Care | Payer: Self-pay | Source: Ambulatory Visit | Attending: Obstetrics & Gynecology | Admitting: Obstetrics & Gynecology

## 2012-04-04 DIAGNOSIS — N926 Irregular menstruation, unspecified: Secondary | ICD-10-CM | POA: Insufficient documentation

## 2012-04-04 LAB — URINE MICROSCOPIC-ADD ON

## 2012-04-04 LAB — URINALYSIS, ROUTINE W REFLEX MICROSCOPIC
Bilirubin Urine: NEGATIVE
Ketones, ur: NEGATIVE mg/dL
Nitrite: NEGATIVE
Specific Gravity, Urine: 1.01 (ref 1.005–1.030)
pH: 8.5 — ABNORMAL HIGH (ref 5.0–8.0)

## 2012-04-04 NOTE — MAU Note (Signed)
Vag. Bleeding for couple days. Bright red yesterday but not enough for a pad. More bleeding today but not as bright and smells weird. No pain. Just don't feel well. Passed out few days ago

## 2012-04-04 NOTE — MAU Provider Note (Signed)
  History     CSN: [id number]  Arrival date and time: 04/04/12 2050   First Provider Initiated Contact with Patient 04/04/12 2300      Chief Complaint  Patient presents with  . Vaginal Bleeding   HPI  [name] is a 26 y.o. [id number] she had a period in August, and then hasn't had one since. She took "many" home pregnancy tests that were all positive, and now she is bleeding. Urine pregnancy test here is negative.   Past Medical History  Diagnosis Date  . Asthma   . Atrial fibrillation     LV function lower limit of normal   . GERD (gastroesophageal reflux disease)   . Depression     Past Surgical History  Procedure Date  . Heart ablation     Family History  Problem Relation Age of Onset  . Asthma Brother   . Hypertension Mother   . Hypertension Maternal Grandmother     History  Substance Use Topics  . Smoking status: Current Every Day Smoker    Types: Cigarettes  . Smokeless tobacco: Not on file   Comment: since 2002; smokes about 8 cig/day   . Alcohol Use: Yes     weekends     Allergies: No Known Allergies  Prescriptions prior to admission  Medication Sig Dispense Refill  . digoxin (LANOXIN) 0.25 MG tablet Take 1 tablet (0.25 mg total) by mouth daily.  30 tablet  1  . sotalol (BETAPACE) 120 MG tablet Take 120 mg by mouth 2 (two) times daily.      Marland Kitchen albuterol (PROVENTIL HFA;VENTOLIN HFA) 108 (90 BASE) MCG/ACT inhaler Inhale 1-2 puffs into the lungs every 6 (six) hours as needed. For wheezing        Review of Systems  Constitutional: Negative for fever and chills.  Respiratory: Negative for cough.   Gastrointestinal: Positive for nausea and abdominal pain. Negative for vomiting, diarrhea and constipation.  Genitourinary: Negative for dysuria, urgency and frequency.  Musculoskeletal: Negative for myalgias.  Neurological: Negative for dizziness.   Physical Exam   Blood pressure 112/78, pulse 72, temperature 98 F (36.7 C), temperature source Oral,  resp. rate 18, height 5\' 7"  (1.702 m), weight 52.617 kg (116 lb), SpO2 99.00%.  Physical Exam  Nursing note and vitals reviewed. Constitutional: She is oriented to person, place, and time. She appears well-developed and well-nourished.  HENT:  Head: Normocephalic.  Cardiovascular: Normal rate and regular rhythm.   Respiratory: Effort normal.  GI: Soft. Bowel sounds are normal.  Genitourinary:        Pt refuses pelvic exam today. She only came here because she thought she was bleeding and pregnant, since pregnancy test was negative she refuses to have a pelvic exam.   Neurological: She is alert and oriented to person, place, and time.  Skin: Skin is warm and dry.    MAU Course  Procedures    Assessment and Plan   1. Irregular menstrual cycle   Desires contraception Will FU in GYN clinic  Tawnya Crook 04/04/2012, 11:00 PM

## 2012-04-04 NOTE — MAU Note (Addendum)
Pt reports a positive pregnancy test last week. Then bleeding for 2 days and mild cramping on and off for those 2 days. Pt reports blacking out in the shower then blacking out on the toilet immediately after 2 days ago.

## 2012-04-05 NOTE — MAU Note (Signed)
Pt left MAU before RN was able to deliver discharge paperwork to patient.

## 2012-04-08 LAB — URINE CULTURE

## 2012-04-21 ENCOUNTER — Encounter: Payer: Self-pay | Admitting: Family Medicine

## 2012-04-24 ENCOUNTER — Emergency Department (HOSPITAL_COMMUNITY): Payer: Self-pay

## 2012-04-24 ENCOUNTER — Encounter (HOSPITAL_COMMUNITY): Payer: Self-pay | Admitting: Physical Medicine and Rehabilitation

## 2012-04-24 ENCOUNTER — Emergency Department (HOSPITAL_COMMUNITY)
Admission: EM | Admit: 2012-04-24 | Discharge: 2012-04-24 | Disposition: A | Discharge status: Home / Self Care | Payer: Self-pay | Attending: Emergency Medicine | Admitting: Emergency Medicine

## 2012-04-24 DIAGNOSIS — Z8719 Personal history of other diseases of the digestive system: Secondary | ICD-10-CM | POA: Insufficient documentation

## 2012-04-24 DIAGNOSIS — R002 Palpitations: Secondary | ICD-10-CM | POA: Insufficient documentation

## 2012-04-24 DIAGNOSIS — Z8659 Personal history of other mental and behavioral disorders: Secondary | ICD-10-CM | POA: Insufficient documentation

## 2012-04-24 DIAGNOSIS — J45909 Unspecified asthma, uncomplicated: Secondary | ICD-10-CM | POA: Insufficient documentation

## 2012-04-24 DIAGNOSIS — F17200 Nicotine dependence, unspecified, uncomplicated: Secondary | ICD-10-CM | POA: Insufficient documentation

## 2012-04-24 DIAGNOSIS — R0602 Shortness of breath: Secondary | ICD-10-CM | POA: Insufficient documentation

## 2012-04-24 LAB — CBC WITH DIFFERENTIAL/PLATELET
Eosinophils Absolute: 0.5 10*3/uL (ref 0.0–0.7)
Eosinophils Relative: 5 % (ref 0–5)
HCT: 45 % (ref 36.0–46.0)
Hemoglobin: 15.7 g/dL — ABNORMAL HIGH (ref 12.0–15.0)
Lymphocytes Relative: 24 % (ref 12–46)
Lymphs Abs: 2.5 10*3/uL (ref 0.7–4.0)
MCH: 30.8 pg (ref 26.0–34.0)
MCV: 88.2 fL (ref 78.0–100.0)
Monocytes Relative: 11 % (ref 3–12)
Platelets: 252 10*3/uL (ref 150–400)
RBC: 5.1 MIL/uL (ref 3.87–5.11)
WBC: 10.1 10*3/uL (ref 4.0–10.5)

## 2012-04-24 LAB — COMPREHENSIVE METABOLIC PANEL
ALT: 21 U/L (ref 0–35)
Alkaline Phosphatase: 81 U/L (ref 39–117)
BUN: 9 mg/dL (ref 6–23)
CO2: 29 mEq/L (ref 19–32)
Calcium: 9.7 mg/dL (ref 8.4–10.5)
GFR calc Af Amer: >90 mL/min (ref >90)
GFR calc non Af Amer: >90 mL/min (ref >90)
Glucose, Bld: 99 mg/dL (ref 70–99)
Sodium: 137 mEq/L (ref 135–145)
Total Protein: 7.7 g/dL (ref 6.0–8.3)

## 2012-04-24 NOTE — ED Notes (Signed)
Pt presents to department for evaluation of SOB and palpitations x2 days. History of atrial fibrillation, has not been taking Digoxin because she could not afford medication. Denies chest pain at the time. Respirations unlabored upon arrival. States SOB becomes worse on exertion. She is alert and oriented x4.

## 2012-04-29 ENCOUNTER — Emergency Department (HOSPITAL_COMMUNITY)
Admission: EM | Admit: 2012-04-29 | Discharge: 2012-04-29 | Disposition: A | Discharge status: Home / Self Care | Payer: Self-pay | Source: Home / Self Care

## 2012-04-29 ENCOUNTER — Encounter (HOSPITAL_COMMUNITY): Payer: Self-pay

## 2012-04-29 DIAGNOSIS — R05 Cough: Secondary | ICD-10-CM

## 2012-04-29 DIAGNOSIS — J45909 Unspecified asthma, uncomplicated: Secondary | ICD-10-CM

## 2012-04-29 DIAGNOSIS — J069 Acute upper respiratory infection, unspecified: Secondary | ICD-10-CM

## 2012-04-29 DIAGNOSIS — I429 Cardiomyopathy, unspecified: Secondary | ICD-10-CM

## 2012-04-29 DIAGNOSIS — I4891 Unspecified atrial fibrillation: Secondary | ICD-10-CM

## 2012-04-29 DIAGNOSIS — J45901 Unspecified asthma with (acute) exacerbation: Secondary | ICD-10-CM

## 2012-04-29 MED ORDER — PREDNISONE 20 MG PO TABS: 20.0000 mg | ORAL_TABLET | Freq: Every day | ORAL | Status: DC

## 2012-04-29 MED ORDER — ALBUTEROL SULFATE (5 MG/ML) 0.5% IN NEBU
INHALATION_SOLUTION | RESPIRATORY_TRACT | Status: AC
  Filled 2012-04-29: qty 1

## 2012-04-29 MED ORDER — IPRATROPIUM BROMIDE 0.02 % IN SOLN: 0.5000 mg | RESPIRATORY_TRACT | Status: DC

## 2012-04-29 MED ORDER — ALBUTEROL SULFATE HFA 108 (90 BASE) MCG/ACT IN AERS: 2.0000 | INHALATION_SPRAY | RESPIRATORY_TRACT | Status: DC | PRN

## 2012-04-29 MED ORDER — IPRATROPIUM-ALBUTEROL 0.5-2.5 (3) MG/3ML IN SOLN: 3.0000 mL | RESPIRATORY_TRACT | Status: DC | PRN

## 2012-04-29 MED ORDER — AMOXICILLIN-POT CLAVULANATE 500-125 MG PO TABS: 1.0000 | ORAL_TABLET | Freq: Three times a day (TID) | ORAL | Status: DC

## 2012-04-29 MED ORDER — ALBUTEROL SULFATE (5 MG/ML) 0.5% IN NEBU: 2.5000 mg | INHALATION_SOLUTION | RESPIRATORY_TRACT | Status: DC

## 2012-04-29 NOTE — ED Provider Notes (Signed)
History     CSN: [id number]  Arrival date & time 04/29/12  1051  Chief Complaint  Patient presents with  . Shortness of Breath    H/O Asthma   The history is provided by the patient. No language interpreter was used.   this patient has a history of asthma and reports that she's been without her medications for long time.  She reports that she normally has more difficulty with her asthma during colder weather.  She reports that for the past 4-5 days she's had increasing weakness and shortness of breath and wheezing.  The patient reports that she has a difficult time breathing at night.  She's been wheezing constantly.  She's not able to move a lot of air.  She came to the clinic for evaluation.  She reports that she's had somebody aches and low-grade temperature.  She's having a cough that is nonproductive.  She also has a history of atrial fibrillation and takes digoxin and sotalol.  She has a cardiologist and follows closely.  She is a smoker but reports that she quit smoking about 3 days ago.  She had been smoking approximately 1/2 pack per day.  She reports that she does not have medical insurance and has had a difficult time with her medications.  She reports that she does have some funds to get her prescriptions filled today.    Past Medical History  Diagnosis Date  . Asthma   . Atrial fibrillation     LV function lower limit of normal   . GERD (gastroesophageal reflux disease)   . Depression     Past Surgical History  Procedure Date  . Heart ablation     Family History  Problem Relation Age of Onset  . Asthma Brother   . Hypertension Mother   . Hypertension Maternal Grandmother     History  Substance Use Topics  . Smoking status: Current Every Day Smoker    Types: Cigarettes  . Smokeless tobacco: Not on file     Comment: since 2002; smokes about 8 cig/day   . Alcohol Use: Yes     Comment: weekends     OB History    Grav Para Term Preterm Abortions TAB SAB Ect Mult  Living   5 2  2 3 2 1   2       Review of Systems  Constitutional: Positive for fever, chills, activity change and fatigue. Negative for appetite change.  HENT: Positive for congestion, rhinorrhea, sneezing and postnasal drip. Negative for neck pain and neck stiffness.   Eyes: Negative.   Respiratory: Positive for cough, chest tightness, shortness of breath and wheezing. Negative for apnea and stridor.   Cardiovascular: Negative.   Gastrointestinal: Negative.   Musculoskeletal: Negative.   Neurological: Positive for weakness.  Psychiatric/Behavioral: Negative.     Allergies  Review of patient's allergies indicates no known allergies.  Home Medications   Current Outpatient Rx  Name  Route  Sig  Dispense  Refill  . ALBUTEROL SULFATE HFA 108 (90 BASE) MCG/ACT IN AERS   Inhalation   Inhale 1-2 puffs into the lungs every 6 (six) hours as needed. For wheezing         . DIGOXIN 0.25 MG PO TABS   Oral   Take 1 tablet (0.25 mg total) by mouth daily.   30 tablet   1     Patient needs to call for appointment   . SOTALOL HCL 120 MG PO TABS   Oral  Take 120 mg by mouth 2 (two) times daily.           Pulse 84  Temp 98.3 F (36.8 C) (Oral)  Resp 22  SpO2 93%  LMP 04/17/2012  Physical Exam  Constitutional: She is oriented to person, place, and time. She appears well-developed and well-nourished. No distress.  HENT:  Head: Normocephalic and atraumatic.  Eyes: Conjunctivae normal and EOM are normal. Pupils are equal, round, and reactive to light.  Neck: Normal range of motion. Neck supple. No JVD present. No thyromegaly present.  Cardiovascular: Normal rate.   Pulmonary/Chest: She has wheezes. She has rales. She exhibits tenderness.  Abdominal: Soft. Bowel sounds are normal.  Musculoskeletal: Normal range of motion.  Neurological: She is alert and oriented to person, place, and time.  Skin: Skin is warm and dry. No rash noted. No erythema. No pallor.  Psychiatric: She  has a normal mood and affect. Her behavior is normal. Judgment and thought content normal.    ED Course  Procedures (including critical care time)  Labs Reviewed - No data to display No results found.  No diagnosis found.   MDM  IMPRESSION  Significant asthma exacerbation  Asthma  Allergic rhinitis  History of atrial fibrillation  RECOMMENDATIONS / PLAN The patient was given 3 nebulizer treatments in the office today and reevaluated.  After the nebulizer treatments her oxygen saturation improved from 93% to 98%.  The patient began to breathe much better the coughing resolved and she had much less wheezing.  She declined to go to the emergency department for further care.  I started her on prednisone 20 mg to take 3 tablets daily for 2 days to 2 tablets daily for 7 days.  I gave her prescription for nebulizer medication as she reports that she has a nebulizer at home with supplies, albuterol inhaler was prescribed and she also will continue her Advair Diskus twice a day and she been taking it I strongly encouraged her to discontinue all tobacco use.  I also asked if she would please followup in the next week for recheck.  I strongly invite her to go to the emergency department which she declined.  The patient says she would go to the ED if her symptoms do not improve or worsened over the next several days.  She says she felt much better after receiving the nebulizer treatments.  She also requested that she have a note faxed to the [facility] as evidence that she had been seen here in the clinic today for significant illness.  We did fax a note for the patient.  The patient is to continue her cardiac medications and followup with her cardiologist if she develops any worsening palpitations or shortness of breath or chest pain.  She verbalized understanding.  FOLLOW UP 1 week   The patient was given clear instructions to go to ER or return to medical center if symptoms don't  improve, worsen or new problems develop.  The patient verbalized understanding.  The patient was told to call to get lab results if they haven't heard anything in the next week.            Cleora Fleet, MD 04/29/12 1453

## 2012-04-29 NOTE — ED Notes (Signed)
Patient c/o SOB x1 day  Cough wheezing patient states went to the ER on Saturday 11/23  Had a chest x ray   But did not receive results.

## 2012-04-30 ENCOUNTER — Encounter (HOSPITAL_COMMUNITY): Payer: Self-pay | Admitting: *Deleted

## 2012-04-30 ENCOUNTER — Emergency Department (HOSPITAL_COMMUNITY)
Admission: EM | Admit: 2012-04-30 | Discharge: 2012-04-30 | Disposition: A | Discharge status: Home / Self Care | Payer: Self-pay | Attending: Emergency Medicine | Admitting: Emergency Medicine

## 2012-04-30 DIAGNOSIS — Z8679 Personal history of other diseases of the circulatory system: Secondary | ICD-10-CM | POA: Insufficient documentation

## 2012-04-30 DIAGNOSIS — R05 Cough: Secondary | ICD-10-CM | POA: Insufficient documentation

## 2012-04-30 DIAGNOSIS — F329 Major depressive disorder, single episode, unspecified: Secondary | ICD-10-CM | POA: Insufficient documentation

## 2012-04-30 DIAGNOSIS — J45901 Unspecified asthma with (acute) exacerbation: Secondary | ICD-10-CM | POA: Insufficient documentation

## 2012-04-30 DIAGNOSIS — Z79899 Other long term (current) drug therapy: Secondary | ICD-10-CM | POA: Insufficient documentation

## 2012-04-30 DIAGNOSIS — R059 Cough, unspecified: Secondary | ICD-10-CM | POA: Insufficient documentation

## 2012-04-30 DIAGNOSIS — IMO0002 Reserved for concepts with insufficient information to code with codable children: Secondary | ICD-10-CM | POA: Insufficient documentation

## 2012-04-30 DIAGNOSIS — J45909 Unspecified asthma, uncomplicated: Secondary | ICD-10-CM

## 2012-04-30 DIAGNOSIS — F3289 Other specified depressive episodes: Secondary | ICD-10-CM | POA: Insufficient documentation

## 2012-04-30 DIAGNOSIS — R0602 Shortness of breath: Secondary | ICD-10-CM | POA: Insufficient documentation

## 2012-04-30 DIAGNOSIS — Z8719 Personal history of other diseases of the digestive system: Secondary | ICD-10-CM | POA: Insufficient documentation

## 2012-04-30 DIAGNOSIS — F17200 Nicotine dependence, unspecified, uncomplicated: Secondary | ICD-10-CM | POA: Insufficient documentation

## 2012-04-30 MED ORDER — ALBUTEROL SULFATE (5 MG/ML) 0.5% IN NEBU
5.0000 mg | INHALATION_SOLUTION | Freq: Once | RESPIRATORY_TRACT | Status: AC
  Administered 2012-04-30: 5 mg via RESPIRATORY_TRACT
  Filled 2012-04-30: qty 1

## 2012-04-30 MED ORDER — PREDNISONE 10 MG PO TABS: 40.0000 mg | ORAL_TABLET | Freq: Every day | ORAL | Status: DC

## 2012-04-30 MED ORDER — ALBUTEROL SULFATE HFA 108 (90 BASE) MCG/ACT IN AERS
2.0000 | INHALATION_SPRAY | RESPIRATORY_TRACT | Status: DC | PRN
  Administered 2012-04-30: 2 via RESPIRATORY_TRACT
  Filled 2012-04-30: qty 6.7

## 2012-04-30 MED ORDER — PREDNISONE 20 MG PO TABS
40.0000 mg | ORAL_TABLET | Freq: Once | ORAL | Status: AC
  Administered 2012-04-30: 40 mg via ORAL
  Filled 2012-04-30: qty 2

## 2012-04-30 NOTE — ED Provider Notes (Signed)
History   This chart was scribed for Doug Sou, MD by Toya Smothers, ED Scribe. The patient was seen in room TR08C/TR08C. Patient's care was started at 1242.   CSN: [id number]  Arrival date & time 04/30/12  1242   First MD Initiated Contact with Patient 04/30/12 1320      Chief Complaint  Patient presents with  . Asthma   Patient is a 26 y.o. female presenting with asthma. The history is provided by the patient. No language interpreter was used.  Asthma This is a recurrent problem. The current episode started 2 days ago. The problem occurs hourly. The problem has not changed since onset.Associated symptoms include shortness of breath. Nothing aggravates the symptoms. Nothing relieves the symptoms. She has tried nothing for the symptoms.    CALEDONIA ZOU is a 26 y.o. female who presents to the Emergency Department complaining of 2 days of recurrent, hourly, unchanged, moderate wheezing with associated SOB and cough. Pt was seen yesterday at Urgent Care for similar symptoms and fever and Rx Prednisone, "antibiotics," and albuterol. Pt has not filled prescription, stating "the dosage is incorrect." Fever has subsided. No fever, chills, congestion, rhinorrhea, chest pain, or n/v/d. Medical Hx includes Atrial fibrillation, GERD, and Asthma. Surgical Hx includes heart ablation. Pt lists Cardiologist as Dr. Clide Cliff. Pt is a current everyday smoker, and admits social alcohol consumption, marijuana use, and IV Heroine use (1 week ago).   Past Medical History  Diagnosis Date  . Asthma   . Atrial fibrillation     LV function lower limit of normal   . GERD (gastroesophageal reflux disease)   . Depression     Past Surgical History  Procedure Date  . Heart ablation     Family History  Problem Relation Age of Onset  . Asthma Brother   . Hypertension Mother   . Hypertension Maternal Grandmother     History  Substance Use Topics  . Smoking status: Current Every Day Smoker    Types:  Cigarettes  . Smokeless tobacco: Not on file     Comment: since 2002; smokes about 8 cig/day   . Alcohol Use: Yes     Comment: weekends     OB History    Grav Para Term Preterm Abortions TAB SAB Ect Mult Living   5 2  2 3 2 1   2       Review of Systems  Constitutional: Negative.  Negative for fever.  HENT: Negative.   Respiratory: Positive for shortness of breath and wheezing.   Cardiovascular: Negative.   Gastrointestinal: Negative.   Musculoskeletal: Negative.   Skin: Negative.   Neurological: Negative.   Hematological: Negative.   Psychiatric/Behavioral: Negative.   All other systems reviewed and are negative.    Allergies  Review of patient's allergies indicates no known allergies.  Home Medications   Current Outpatient Rx  Name  Route  Sig  Dispense  Refill  . ALBUTEROL SULFATE HFA 108 (90 BASE) MCG/ACT IN AERS   Inhalation   Inhale 2 puffs into the lungs every 4 (four) hours as needed. For wheezing         . DIGOXIN 0.25 MG PO TABS   Oral   Take 1 tablet (0.25 mg total) by mouth daily.   30 tablet   1     Patient needs to call for appointment   . SOTALOL HCL 120 MG PO TABS   Oral   Take 120 mg by mouth 2 (two) times daily.         Marland Kitchen  IPRATROPIUM-ALBUTEROL 0.5-2.5 (3) MG/3ML IN SOLN   Nebulization   Take 3 mLs by nebulization every 4 (four) hours as needed. Pharmacy may substitute for generic equivalents   360 mL   4   . PREDNISONE 20 MG PO TABS   Oral   Take 1 tablet (20 mg total) by mouth daily. Take 3 tabs po daily for 3 days then take 2 tabs po daily for 7 days then stop   23 tablet   0     BP 122/86  Pulse 45  Temp 98.6 F (37 C)  Resp 14  SpO2 92%  LMP 04/17/2012  Physical Exam  Nursing note and vitals reviewed. Constitutional: She appears well-developed and well-nourished.  HENT:  Head: Normocephalic and atraumatic.  Eyes: Conjunctivae normal are normal. Pupils are equal, round, and reactive to light.  Neck: Neck supple. No  tracheal deviation present. No thyromegaly present.  Cardiovascular: Regular rhythm.   No murmur heard.      Bradycardic  Pulmonary/Chest:       Prolonged expiratory phase with expiratory wheeze speaks in paragraphs  Abdominal: Soft. Bowel sounds are normal. She exhibits no distension. There is no tenderness.  Musculoskeletal: Normal range of motion. She exhibits no edema and no tenderness.  Neurological: She is alert. Coordination normal.  Skin: Skin is warm and dry. No rash noted.  Psychiatric: She has a normal mood and affect.    ED Course  Procedures  DIAGNOSTIC STUDIES: Oxygen Saturation is 92% on room air, low by my interpretation.    COORDINATION OF CARE: 13:22- Evaluated Pt. Pt is awake, alert, and without distress. Pt arrived by personal transport and will be driving home. 13:28- Discussed illicit drug use. 13:30- Ordered albuterol (PROVENTIL) (5 MG/ML) 0.5% nebulizer solution 5 mg Once.    Labs Reviewed - No data to display No results found.   No diagnosis found.  3 PM after second nebulizer treatment patient's breathing is normal she speaks in paragraphs, lungs clear auscultation MDM  plan prescription prednisone. Albuterol inhaler with spacer to go. To use 2 puffs every 4 hours when necessary shortness of breath or wheeze Smoking cessation encouraged. Spent 5 minutes counseling the patient on smoking cessation Diagnosis #1exacerbation of asthma #2 tobacco abuse   I personally performed the services described in this documentation, which was scribed in my presence. The recorded information has been reviewed and is accurate.         Doug Sou, MD 04/30/12 2014

## 2012-04-30 NOTE — ED Notes (Signed)
Reports being [facility] at [facility] recently for asthma, states that her prescriptions were written incorrectly and needs new ones + neb tx. Airway intact at triage.

## 2012-06-25 ENCOUNTER — Other Ambulatory Visit: Payer: Self-pay | Admitting: Internal Medicine

## 2012-08-07 ENCOUNTER — Emergency Department (HOSPITAL_COMMUNITY)
Admission: EM | Admit: 2012-08-07 | Discharge: 2012-08-07 | Disposition: A | Discharge status: Home / Self Care | Payer: Self-pay | Attending: Emergency Medicine | Admitting: Emergency Medicine

## 2012-08-07 ENCOUNTER — Encounter (HOSPITAL_COMMUNITY): Payer: Self-pay | Admitting: *Deleted

## 2012-08-07 DIAGNOSIS — Z3202 Encounter for pregnancy test, result negative: Secondary | ICD-10-CM | POA: Insufficient documentation

## 2012-08-07 DIAGNOSIS — Y939 Activity, unspecified: Secondary | ICD-10-CM | POA: Insufficient documentation

## 2012-08-07 DIAGNOSIS — R6883 Chills (without fever): Secondary | ICD-10-CM | POA: Insufficient documentation

## 2012-08-07 DIAGNOSIS — R110 Nausea: Secondary | ICD-10-CM | POA: Insufficient documentation

## 2012-08-07 DIAGNOSIS — J45909 Unspecified asthma, uncomplicated: Secondary | ICD-10-CM | POA: Insufficient documentation

## 2012-08-07 DIAGNOSIS — Z8659 Personal history of other mental and behavioral disorders: Secondary | ICD-10-CM | POA: Insufficient documentation

## 2012-08-07 DIAGNOSIS — L03317 Cellulitis of buttock: Secondary | ICD-10-CM

## 2012-08-07 DIAGNOSIS — Z79899 Other long term (current) drug therapy: Secondary | ICD-10-CM | POA: Insufficient documentation

## 2012-08-07 DIAGNOSIS — Z8719 Personal history of other diseases of the digestive system: Secondary | ICD-10-CM | POA: Insufficient documentation

## 2012-08-07 DIAGNOSIS — F17200 Nicotine dependence, unspecified, uncomplicated: Secondary | ICD-10-CM | POA: Insufficient documentation

## 2012-08-07 DIAGNOSIS — Y929 Unspecified place or not applicable: Secondary | ICD-10-CM | POA: Insufficient documentation

## 2012-08-07 DIAGNOSIS — L0231 Cutaneous abscess of buttock: Secondary | ICD-10-CM

## 2012-08-07 DIAGNOSIS — R109 Unspecified abdominal pain: Secondary | ICD-10-CM

## 2012-08-07 DIAGNOSIS — R42 Dizziness and giddiness: Secondary | ICD-10-CM | POA: Insufficient documentation

## 2012-08-07 DIAGNOSIS — I4891 Unspecified atrial fibrillation: Secondary | ICD-10-CM | POA: Insufficient documentation

## 2012-08-07 LAB — CBC WITH DIFFERENTIAL/PLATELET
Basophils Absolute: 0.1 K/uL (ref 0.0–0.1)
Basophils Relative: 1 % (ref 0–1)
Eosinophils Absolute: 0.3 K/uL (ref 0.0–0.7)
Eosinophils Relative: 3 % (ref 0–5)
HCT: 43 % (ref 36.0–46.0)
Hemoglobin: 14.9 g/dL (ref 12.0–15.0)
Lymphocytes Relative: 24 % (ref 12–46)
Lymphs Abs: 2.5 10*3/uL (ref 0.7–4.0)
MCH: 30.4 pg (ref 26.0–34.0)
MCHC: 34.7 g/dL (ref 30.0–36.0)
MCV: 87.8 fL (ref 78.0–100.0)
Monocytes Absolute: 1.1 10*3/uL — ABNORMAL HIGH (ref 0.1–1.0)
Monocytes Relative: 11 % (ref 3–12)
Neutro Abs: 6.6 10*3/uL (ref 1.7–7.7)
Neutrophils Relative %: 62 % (ref 43–77)
Platelets: 250 10*3/uL (ref 150–400)
RBC: 4.9 MIL/uL (ref 3.87–5.11)
RDW: 14.6 % (ref 11.5–15.5)
WBC: 10.6 10*3/uL — ABNORMAL HIGH (ref 4.0–10.5)

## 2012-08-07 LAB — URINALYSIS, ROUTINE W REFLEX MICROSCOPIC
Bilirubin Urine: NEGATIVE
Glucose, UA: NEGATIVE mg/dL
Hgb urine dipstick: NEGATIVE
Ketones, ur: NEGATIVE mg/dL
Nitrite: POSITIVE — AB
Protein, ur: NEGATIVE mg/dL
Specific Gravity, Urine: 1.016 (ref 1.005–1.030)
Urobilinogen, UA: 0.2 mg/dL (ref 0.0–1.0)
pH: 6.5 (ref 5.0–8.0)

## 2012-08-07 LAB — URINE MICROSCOPIC-ADD ON

## 2012-08-07 LAB — COMPREHENSIVE METABOLIC PANEL WITH GFR
AST: 22 U/L (ref 0–37)
Albumin: 3.8 g/dL (ref 3.5–5.2)
Alkaline Phosphatase: 113 U/L (ref 39–117)
BUN: 12 mg/dL (ref 6–23)
Creatinine, Ser: 0.63 mg/dL (ref 0.50–1.10)
Potassium: 4.6 meq/L (ref 3.5–5.1)
Total Protein: 7.3 g/dL (ref 6.0–8.3)

## 2012-08-07 LAB — COMPREHENSIVE METABOLIC PANEL
ALT: 25 U/L (ref 0–35)
CO2: 30 mEq/L (ref 19–32)
Calcium: 9.5 mg/dL (ref 8.4–10.5)
Chloride: 100 mEq/L (ref 96–112)
GFR calc Af Amer: >90 mL/min (ref >90)
GFR calc non Af Amer: >90 mL/min (ref >90)
Glucose, Bld: 90 mg/dL (ref 70–99)
Sodium: 139 mEq/L (ref 135–145)
Total Bilirubin: 0.6 mg/dL (ref 0.3–1.2)

## 2012-08-07 LAB — DIGOXIN LEVEL: Digoxin Level: <0.3 ng/mL — ABNORMAL LOW (ref 0.8–2.0)

## 2012-08-07 LAB — LIPASE, BLOOD: Lipase: 30 U/L (ref 11–59)

## 2012-08-07 MED ORDER — LIDOCAINE HCL (PF) 1 % IJ SOLN
5.0000 mL | Freq: Once | INTRAMUSCULAR | Status: DC
  Filled 2012-08-07: qty 5

## 2012-08-07 MED ORDER — CLINDAMYCIN HCL 150 MG PO CAPS: 150.0000 mg | ORAL_CAPSULE | Freq: Four times a day (QID) | ORAL | Status: DC

## 2012-08-07 MED ORDER — OXYCODONE-ACETAMINOPHEN 5-325 MG PO TABS: ORAL_TABLET | ORAL | Status: DC

## 2012-08-07 MED ORDER — OXYCODONE-ACETAMINOPHEN 5-325 MG PO TABS
2.0000 | ORAL_TABLET | Freq: Once | ORAL | Status: AC
  Administered 2012-08-07: 2 via ORAL
  Filled 2012-08-07: qty 2

## 2012-08-07 NOTE — ED Provider Notes (Signed)
History     CSN: [id number]  Arrival date & time 08/07/12  4098   First MD Initiated Contact with Patient 08/07/12 1059      Chief Complaint  Patient presents with  . Insect Bite  . Abdominal Pain    (Consider location/radiation/quality/duration/timing/severity/associated sxs/prior treatment) HPI Comments: Patient reports that she was bitten by an insect, she suspects a spider, and a left gluteal area 24 days ago. She reports she's had some intermittent nausea and not feeling well starting about 3 days ago. She thinks she had some chills on that day which have resolved. She denies any overt fevers. She reports swelling, induration and redness has gotten worse over the last few days. She also route tells me about some intermittent abdominal discomfort over the last several months but denies frank vomiting, weight loss, diarrhea, bloody stools.  Patient is a 27 y.o. female presenting with abdominal pain. The history is provided by the patient and a friend.  Abdominal Pain Associated symptoms: chills and nausea   Associated symptoms: no chest pain, no diarrhea, no fever, no shortness of breath and no vomiting     Past Medical History  Diagnosis Date  . Asthma   . Atrial fibrillation     LV function lower limit of normal   . GERD (gastroesophageal reflux disease)   . Depression     Past Surgical History  Procedure Laterality Date  . Heart ablation      Family History  Problem Relation Age of Onset  . Asthma Brother   . Hypertension Mother   . Hypertension Maternal Grandmother     History  Substance Use Topics  . Smoking status: Current Every Day Smoker    Types: Cigarettes  . Smokeless tobacco: Not on file     Comment: since 2002; smokes about 8 cig/day   . Alcohol Use: Yes     Comment: weekends     OB History   Grav Para Term Preterm Abortions TAB SAB Ect Mult Living   5 2  2 3 2 1   2       Review of Systems  Constitutional: Positive for chills. Negative for  fever.  Respiratory: Negative for chest tightness and shortness of breath.   Cardiovascular: Negative for chest pain.  Gastrointestinal: Positive for nausea and abdominal pain. Negative for vomiting, diarrhea and blood in stool.  Musculoskeletal: Negative for back pain.  Neurological: Positive for dizziness. Negative for syncope and light-headedness.  All other systems reviewed and are negative.    Allergies  Review of patient's allergies indicates no known allergies.  Home Medications   Current Outpatient Rx  Name  Route  Sig  Dispense  Refill  . albuterol (PROVENTIL HFA;VENTOLIN HFA) 108 (90 BASE) MCG/ACT inhaler   Inhalation   Inhale 2 puffs into the lungs every 4 (four) hours as needed for wheezing. For wheezing         . digoxin (LANOXIN) 0.25 MG tablet   Oral   Take 1 tablet (0.25 mg total) by mouth daily.   30 tablet   1     Patient needs to call for appointment   . ipratropium-albuterol (DUONEB) 0.5-2.5 (3) MG/3ML SOLN   Nebulization   Take 3 mLs by nebulization every 4 (four) hours as needed (for wheezing and shortness of breath.). Pharmacy may substitute for generic equivalents         . predniSONE (DELTASONE) 10 MG tablet   Oral   Take 40 mg by mouth daily.         Marland Kitchen  predniSONE (DELTASONE) 20 MG tablet   Oral   Take 1 tablet (20 mg total) by mouth daily. Take 3 tabs po daily for 3 days then take 2 tabs po daily for 7 days then stop   23 tablet   0   . sotalol (BETAPACE) 120 MG tablet   Oral   Take 120 mg by mouth 2 (two) times daily.           BP 125/78  Pulse 83  Temp(Src) 97.5 F (36.4 C) (Oral)  SpO2 99%  Physical Exam  Nursing note and vitals reviewed. Constitutional: She appears well-developed and well-nourished.  HENT:  Head: Normocephalic and atraumatic.  Cardiovascular: Normal rate and regular rhythm.   Pulmonary/Chest: Effort normal. No respiratory distress.  Abdominal: Soft. She exhibits no distension. There is no tenderness.  There is no rebound.  Neurological: She is alert.  Skin: Skin is warm. Lesion noted.     Small scab with focal area of erythema and warmth at left gluteal area with some surrounding induration measuring approximately 3 cm. There appears to be some central fluctuance. There is also significant mild erythema that is surrounding that area involving portion of her gluteal region that is tender.    ED Course  Procedures (including critical care time)  Labs Reviewed  URINALYSIS, ROUTINE W REFLEX MICROSCOPIC - Abnormal; Notable for the following:    APPearance CLOUDY (*)    Nitrite POSITIVE (*)    Leukocytes, UA SMALL (*)    All other components within normal limits  CBC WITH DIFFERENTIAL - Abnormal; Notable for the following:    WBC 10.6 (*)    Monocytes Absolute 1.1 (*)    All other components within normal limits  URINE MICROSCOPIC-ADD ON - Abnormal; Notable for the following:    Squamous Epithelial / LPF MANY (*)    Bacteria, UA MANY (*)    All other components within normal limits  URINE CULTURE  COMPREHENSIVE METABOLIC PANEL  LIPASE, BLOOD  DIGOXIN LEVEL  POCT PREGNANCY, URINE   No results found.   1. Abscess of left buttock   2. Cellulitis of left buttock   3. Abdominal pain     Room air saturation is 99% I interpret this to be normal.  MDM  Patient is nontoxic in appearance. She is afebrile. I suspect that she has some cellulitis of her gluteal region and probably some central abscess formation from this insect bite. Her abdomen is soft with no guarding or rebound. She has no vomiting, fevers and no bloody stools by report. I suspect the symptoms can be readdressed as an outpatient with her primary care provider. Plan would be to attempt an I&D and did put her on antibiotics to cover for cellulitis as well. Urinalysis shows many squamous epithelial cells and she has no dysuria therefore suspect this is a contaminant.  PAC Kirichenko to perform  I&D.          Gavin Pound. Torell Minder, MD 08/07/12 1206

## 2012-08-07 NOTE — Discharge Instructions (Signed)
 Abscess An abscess is an infected area that contains a collection of pus and debris.It can occur in almost any part of the body. An abscess is also known as a furuncle or boil. CAUSES  An abscess occurs when tissue gets infected. This can occur from blockage of oil or sweat glands, infection of hair follicles, or a minor injury to the skin. As the body tries to fight the infection, pus collects in the area and creates pressure under the skin. This pressure causes pain. People with weakened immune systems have difficulty fighting infections and get certain abscesses more often.  SYMPTOMS Usually an abscess develops on the skin and becomes a painful mass that is red, warm, and tender. If the abscess forms under the skin, you may feel a moveable soft area under the skin. Some abscesses break open (rupture) on their own, but most will continue to get worse without care. The infection can spread deeper into the body and eventually into the bloodstream, causing you to feel ill.  DIAGNOSIS  Your caregiver will take your medical history and perform a physical exam. A sample of fluid may also be taken from the abscess to determine what is causing your infection. TREATMENT  Your caregiver may prescribe antibiotic medicines to fight the infection. However, taking antibiotics alone usually does not cure an abscess. Your caregiver may need to make a small cut (incision) in the abscess to drain the pus. In some cases, gauze is packed into the abscess to reduce pain and to continue draining the area. HOME CARE INSTRUCTIONS   Only take over-the-counter or prescription medicines for pain, discomfort, or fever as directed by your caregiver.  If you were prescribed antibiotics, take them as directed. Finish them even if you start to feel better.  If gauze is used, follow your caregiver's directions for changing the gauze.  To avoid spreading the infection:  Keep your draining abscess covered with a  bandage.  Wash your hands well.  Do not share personal care items, towels, or whirlpools with others.  Avoid skin contact with others.  Keep your skin and clothes clean around the abscess.  Keep all follow-up appointments as directed by your caregiver. SEEK MEDICAL CARE IF:   You have increased pain, swelling, redness, fluid drainage, or bleeding.  You have muscle aches, chills, or a general ill feeling.  You have a fever. MAKE SURE YOU:   Understand these instructions.  Will watch your condition.  Will get help right away if you are not doing well or get worse. Document Released: 02/28/2005 Document Revised: 11/20/2011 Document Reviewed: 08/03/2011 [facility] Patient Information 2013 [place], [place].     Abdominal Pain, Women Abdominal (stomach, pelvic, or belly) pain can be caused by many things. It is important to tell your doctor:  The location of the pain.  Does it come and go or is it present all the time?  Are there things that start the pain (eating certain foods, exercise)?  Are there other symptoms associated with the pain (fever, nausea, vomiting, diarrhea)? All of this is helpful to know when trying to find the cause of the pain. CAUSES   Stomach: virus or bacteria infection, or ulcer.  Intestine: appendicitis (inflamed appendix), regional ileitis (Crohn's disease), ulcerative colitis (inflamed colon), irritable bowel syndrome, diverticulitis (inflamed diverticulum of the colon), or cancer of the stomach or intestine.  Gallbladder disease or stones in the gallbladder.  Kidney disease, kidney stones, or infection.  Pancreas infection or cancer.  Fibromyalgia (pain disorder).  Diseases of the female organs:  Uterus: fibroid (non-cancerous) tumors or infection.  Fallopian tubes: infection or tubal pregnancy.  Ovary: cysts or tumors.  Pelvic adhesions (scar tissue).  Endometriosis (uterus lining tissue growing in the pelvis and on the pelvic  organs).  Pelvic congestion syndrome (female organs filling up with blood just before the menstrual period).  Pain with the menstrual period.  Pain with ovulation (producing an egg).  Pain with an IUD (intrauterine device, birth control) in the uterus.  Cancer of the female organs.  Functional pain (pain not caused by a disease, may improve without treatment).  Psychological pain.  Depression. DIAGNOSIS  Your doctor will decide the seriousness of your pain by doing an examination.  Blood tests.  X-rays.  Ultrasound.  CT scan (computed tomography, special type of X-ray).  MRI (magnetic resonance imaging).  Cultures, for infection.  Barium enema (dye inserted in the large intestine, to better view it with X-rays).  Colonoscopy (looking in intestine with a lighted tube).  Laparoscopy (minor surgery, looking in abdomen with a lighted tube).  Major abdominal exploratory surgery (looking in abdomen with a large incision). TREATMENT  The treatment will depend on the cause of the pain.   Many cases can be observed and treated at home.  Over-the-counter medicines recommended by your caregiver.  Prescription medicine.  Antibiotics, for infection.  Birth control pills, for painful periods or for ovulation pain.  Hormone treatment, for endometriosis.  Nerve blocking injections.  Physical therapy.  Antidepressants.  Counseling with a psychologist or psychiatrist.  Minor or major surgery. HOME CARE INSTRUCTIONS   Do not take laxatives, unless directed by your caregiver.  Take over-the-counter pain medicine only if ordered by your caregiver. Do not take aspirin  because it can cause an upset stomach or bleeding.  Try a clear liquid diet (broth or water) as ordered by your caregiver. Slowly move to a bland diet, as tolerated, if the pain is related to the stomach or intestine.  Have a thermometer and take your temperature several times a day, and record  it.  Bed rest and sleep, if it helps the pain.  Avoid sexual intercourse, if it causes pain.  Avoid stressful situations.  Keep your follow-up appointments and tests, as your caregiver orders.  If the pain does not go away with medicine or surgery, you may try:  Acupuncture.  Relaxation exercises (yoga, meditation).  Group therapy.  Counseling. SEEK MEDICAL CARE IF:   You notice certain foods cause stomach pain.  Your home care treatment is not helping your pain.  You need stronger pain medicine.  You want your IUD removed.  You feel faint or lightheaded.  You develop nausea and vomiting.  You develop a rash.  You are having side effects or an allergy to your medicine. SEEK IMMEDIATE MEDICAL CARE IF:   Your pain does not go away or gets worse.  You have a fever.  Your pain is felt only in portions of the abdomen. The right side could possibly be appendicitis. The left lower portion of the abdomen could be colitis or diverticulitis.  You are passing blood in your stools (bright red or black tarry stools, with or without vomiting).  You have blood in your urine.  You develop chills, with or without a fever.  You pass out. MAKE SURE YOU:   Understand these instructions.  Will watch your condition.  Will get help right away if you are not doing well or get worse. Document Released: 03/18/2007 Document  Revised: 08/13/2011 Document Reviewed: 04/07/2009 [facility] Patient Information 2013 [place], [place].     Narcotic and benzodiazepine use may cause drowsiness, slowed breathing or dependence.  Please use with caution and do not drive, operate machinery or watch young children alone while taking them.  Taking combinations of these medications or drinking alcohol will potentiate these effects.

## 2012-08-07 NOTE — ED Notes (Signed)
abcess dressed with gauze and occlusive gauze dressing.

## 2012-08-07 NOTE — ED Notes (Addendum)
Pt reports she thinks she was bitten by brown recluse to left butt cheek. Pain to site, red and swollen. Also reports abdominal pain x 3-4 weeks. States gained 10 pounds in last month. LMP 2.5 months ago, but states pregnancy test has been negative.

## 2012-08-07 NOTE — ED Provider Notes (Signed)
INCISION AND DRAINAGE Performed by: Jaynie Crumble A Consent: Verbal consent obtained. Risks and benefits: risks, benefits and alternatives were discussed Type: abscess  Body area: left buttock  Anesthesia: local infiltration  Incision was made with a scalpel.  Local anesthetic: lidocaine 2% w epinephrine  Anesthetic total: 3 ml  Complexity: complex Blunt dissection to break up loculations  Drainage: purulent  Drainage amount: large  Packing material: 1/2 in iodoform gauze  Patient tolerance: Patient tolerated the procedure well with no immediate complications.     Lottie Mussel, PA-C 08/07/12 1157

## 2012-08-07 NOTE — ED Notes (Signed)
Abces noted to left buttock, erythemous and tender to palpation.

## 2012-08-09 LAB — URINE CULTURE: Colony Count: >=100000

## 2012-08-10 ENCOUNTER — Telehealth (HOSPITAL_COMMUNITY): Payer: Self-pay | Admitting: Emergency Medicine

## 2012-08-10 NOTE — ED Notes (Signed)
Patient has +Urine culture. Checking to see if appropriately treated. °

## 2012-08-10 NOTE — ED Notes (Signed)
+   Urine Chart sent to EDP office for review. 

## 2012-08-11 NOTE — ED Notes (Signed)
Chart returned from EDP office -rx for Keflex 500 mg 1 po QID x 5 days # 20 written by Dr Juleen China

## 2012-08-13 ENCOUNTER — Encounter (HOSPITAL_COMMUNITY): Payer: Self-pay | Admitting: Emergency Medicine

## 2012-08-13 ENCOUNTER — Emergency Department (HOSPITAL_COMMUNITY)
Admission: EM | Admit: 2012-08-13 | Discharge: 2012-08-13 | Discharge status: Against Medical Advice | Payer: Self-pay | Attending: Emergency Medicine | Admitting: Emergency Medicine

## 2012-08-13 DIAGNOSIS — F17200 Nicotine dependence, unspecified, uncomplicated: Secondary | ICD-10-CM | POA: Insufficient documentation

## 2012-08-13 DIAGNOSIS — IMO0002 Reserved for concepts with insufficient information to code with codable children: Secondary | ICD-10-CM | POA: Insufficient documentation

## 2012-08-13 DIAGNOSIS — Y838 Other surgical procedures as the cause of abnormal reaction of the patient, or of later complication, without mention of misadventure at the time of the procedure: Secondary | ICD-10-CM | POA: Insufficient documentation

## 2012-08-13 NOTE — ED Notes (Signed)
Patient states she needs to leave.  Patient will return tomorrow and get antibiotics and start.

## 2012-08-13 NOTE — ED Notes (Signed)
Patient states that she had an abcess on left buttock and had an I&D 4-5 days ago.  Patient states she did not have the money for antibiotics and got them today, but area on buttock is now hard and painful.

## 2012-08-16 ENCOUNTER — Telehealth (HOSPITAL_COMMUNITY): Payer: Self-pay | Admitting: Emergency Medicine

## 2012-08-17 ENCOUNTER — Telehealth (HOSPITAL_COMMUNITY): Payer: Self-pay | Admitting: Emergency Medicine

## 2012-08-17 NOTE — ED Notes (Signed)
Unable to contact patient via phone. Sent letter. °

## 2012-09-08 ENCOUNTER — Telehealth (HOSPITAL_COMMUNITY): Payer: Self-pay | Admitting: Emergency Medicine

## 2012-09-08 NOTE — ED Notes (Signed)
No response to letter sent after 30 days. Chart sent to Medical Records. °

## 2012-09-10 ENCOUNTER — Encounter (HOSPITAL_COMMUNITY): Payer: Self-pay | Admitting: Emergency Medicine

## 2012-09-10 ENCOUNTER — Emergency Department (HOSPITAL_COMMUNITY)
Admission: EM | Admit: 2012-09-10 | Discharge: 2012-09-10 | Disposition: A | Discharge status: Home / Self Care | Payer: Self-pay | Attending: Emergency Medicine | Admitting: Emergency Medicine

## 2012-09-10 DIAGNOSIS — Z79899 Other long term (current) drug therapy: Secondary | ICD-10-CM | POA: Insufficient documentation

## 2012-09-10 DIAGNOSIS — J45901 Unspecified asthma with (acute) exacerbation: Secondary | ICD-10-CM | POA: Insufficient documentation

## 2012-09-10 DIAGNOSIS — Z8719 Personal history of other diseases of the digestive system: Secondary | ICD-10-CM | POA: Insufficient documentation

## 2012-09-10 DIAGNOSIS — IMO0002 Reserved for concepts with insufficient information to code with codable children: Secondary | ICD-10-CM | POA: Insufficient documentation

## 2012-09-10 DIAGNOSIS — I4891 Unspecified atrial fibrillation: Secondary | ICD-10-CM | POA: Insufficient documentation

## 2012-09-10 DIAGNOSIS — R05 Cough: Secondary | ICD-10-CM | POA: Insufficient documentation

## 2012-09-10 DIAGNOSIS — Z8659 Personal history of other mental and behavioral disorders: Secondary | ICD-10-CM | POA: Insufficient documentation

## 2012-09-10 DIAGNOSIS — F17200 Nicotine dependence, unspecified, uncomplicated: Secondary | ICD-10-CM | POA: Insufficient documentation

## 2012-09-10 DIAGNOSIS — R059 Cough, unspecified: Secondary | ICD-10-CM | POA: Insufficient documentation

## 2012-09-10 DIAGNOSIS — Z3202 Encounter for pregnancy test, result negative: Secondary | ICD-10-CM | POA: Insufficient documentation

## 2012-09-10 LAB — URINALYSIS, MICROSCOPIC ONLY
Glucose, UA: NEGATIVE mg/dL
Hgb urine dipstick: NEGATIVE
Ketones, ur: NEGATIVE mg/dL
Specific Gravity, Urine: 1.029 (ref 1.005–1.030)
pH: 5 (ref 5.0–8.0)

## 2012-09-10 LAB — POCT I-STAT, CHEM 8
BUN: 12 mg/dL (ref 6–23)
Calcium, Ion: 1.21 mmol/L (ref 1.12–1.23)
Chloride: 98 mEq/L (ref 96–112)
Glucose, Bld: 90 mg/dL (ref 70–99)

## 2012-09-10 LAB — POCT PREGNANCY, URINE: Preg Test, Ur: NEGATIVE

## 2012-09-10 MED ORDER — AEROCHAMBER PLUS FLO-VU MEDIUM MISC
1.0000 | Freq: Once | Status: AC
  Administered 2012-09-10: 1
  Filled 2012-09-10 (×2): qty 1

## 2012-09-10 MED ORDER — ALBUTEROL SULFATE HFA 108 (90 BASE) MCG/ACT IN AERS
4.0000 | INHALATION_SPRAY | Freq: Once | RESPIRATORY_TRACT | Status: AC
  Administered 2012-09-10: 4 via RESPIRATORY_TRACT
  Filled 2012-09-10: qty 6.7

## 2012-09-10 MED ORDER — PREDNISONE 20 MG PO TABS
60.0000 mg | ORAL_TABLET | Freq: Once | ORAL | Status: AC
  Administered 2012-09-10: 60 mg via ORAL
  Filled 2012-09-10: qty 3

## 2012-09-10 MED ORDER — ALBUTEROL SULFATE HFA 108 (90 BASE) MCG/ACT IN AERS: 1.0000 | INHALATION_SPRAY | Freq: Four times a day (QID) | RESPIRATORY_TRACT | Status: DC | PRN

## 2012-09-10 MED ORDER — PREDNISONE 10 MG PO TABS: 60.0000 mg | ORAL_TABLET | Freq: Every day | ORAL | Status: DC

## 2012-09-10 MED ORDER — ONDANSETRON 4 MG PO TBDP
8.0000 mg | ORAL_TABLET | Freq: Once | ORAL | Status: AC
  Administered 2012-09-10: 8 mg via ORAL
  Filled 2012-09-10: qty 2

## 2012-09-10 MED ORDER — ALBUTEROL SULFATE (5 MG/ML) 0.5% IN NEBU
5.0000 mg | INHALATION_SOLUTION | Freq: Once | RESPIRATORY_TRACT | Status: AC
  Administered 2012-09-10: 5 mg via RESPIRATORY_TRACT
  Filled 2012-09-10: qty 1

## 2012-09-10 NOTE — ED Provider Notes (Signed)
History     CSN: [id number]  Arrival date & time 09/10/12  1608   First MD Initiated Contact with Patient 09/10/12 1705      Chief Complaint  Patient presents with  . Shortness of Breath  . Asthma    (Consider location/radiation/quality/duration/timing/severity/associated sxs/prior treatment) Patient is a 27 y.o. female presenting with shortness of breath and asthma.  Shortness of Breath Severity:  Moderate Onset quality:  Gradual Duration:  2 days Timing:  Intermittent Progression:  Worsening Chronicity:  New Context: animal exposure, known allergens, URI and weather changes   Relieved by: albuetrol. Worsened by:  Smoke exposure, weather changes and stress Associated symptoms: cough   Associated symptoms: no chest pain, no fever, no sputum production and no vomiting   Asthma Associated symptoms include coughing. Pertinent negatives include no chest pain, congestion, fever, nausea or vomiting.    Past Medical History  Diagnosis Date  . Asthma   . Atrial fibrillation     LV function lower limit of normal   . GERD (gastroesophageal reflux disease)   . Depression     Past Surgical History  Procedure Laterality Date  . Heart ablation      Family History  Problem Relation Age of Onset  . Asthma Brother   . Hypertension Mother   . Hypertension Maternal Grandmother     History  Substance Use Topics  . Smoking status: Current Every Day Smoker    Types: Cigarettes  . Smokeless tobacco: Not on file     Comment: since 2002; smokes about 8 cig/day   . Alcohol Use: Yes     Comment: weekends     OB History   Grav Para Term Preterm Abortions TAB SAB Ect Mult Living   5 2  2 3 2 1   2       Review of Systems  Constitutional: Negative for fever.  HENT: Negative for congestion.   Respiratory: Positive for cough and shortness of breath. Negative for sputum production.   Cardiovascular: Negative for chest pain.  Gastrointestinal: Negative for nausea, vomiting and  diarrhea.  Genitourinary: Negative for difficulty urinating and menstrual problem (LMP a few days ago).  All other systems reviewed and are negative.    Allergies  Review of patient's allergies indicates no known allergies.  Home Medications   Current Outpatient Rx  Name  Route  Sig  Dispense  Refill  . albuterol (PROVENTIL HFA;VENTOLIN HFA) 108 (90 BASE) MCG/ACT inhaler   Inhalation   Inhale 2 puffs into the lungs every 4 (four) hours as needed for wheezing. For wheezing         . clindamycin (CLEOCIN) 150 MG capsule   Oral   Take 1 capsule (150 mg total) by mouth 4 (four) times daily.   28 capsule   0   . digoxin (LANOXIN) 0.25 MG tablet   Oral   Take 1 tablet (0.25 mg total) by mouth daily.   30 tablet   1     Patient needs to call for appointment   . ipratropium-albuterol (DUONEB) 0.5-2.5 (3) MG/3ML SOLN   Nebulization   Take 3 mLs by nebulization every 4 (four) hours as needed (for wheezing and shortness of breath.). Pharmacy may substitute for generic equivalents         . oxyCODONE-acetaminophen (PERCOCET/ROXICET) 5-325 MG per tablet      1-2 tablets po q 6 hours prn moderate to severe pain   20 tablet   0   . predniSONE (DELTASONE)  10 MG tablet   Oral   Take 40 mg by mouth daily.         . predniSONE (DELTASONE) 20 MG tablet   Oral   Take 1 tablet (20 mg total) by mouth daily. Take 3 tabs po daily for 3 days then take 2 tabs po daily for 7 days then stop   23 tablet   0   . sotalol (BETAPACE) 120 MG tablet   Oral   Take 120 mg by mouth 2 (two) times daily.           BP 123/72  Pulse 81  Temp(Src) 99.2 F (37.3 C) (Oral)  Resp 18  SpO2 98%  LMP 06/05/2012  Physical Exam  Nursing note and vitals reviewed. Constitutional: She is oriented to person, place, and time. She appears well-developed and well-nourished. No distress.  HENT:  Head: Normocephalic and atraumatic.  Mouth/Throat: Oropharynx is clear and moist.  Eyes: Conjunctivae  are normal. Pupils are equal, round, and reactive to light. No scleral icterus.  Neck: Neck supple.  Cardiovascular: Normal rate, regular rhythm, normal heart sounds and intact distal pulses.   No murmur heard. Pulmonary/Chest: Effort normal. No stridor. No respiratory distress. She has no decreased breath sounds. She has wheezes. She has no rales.  Good air movement, bilateral wheezing with forced exertion (exam performed after receiving albuterol in triage)  Abdominal: Soft. Bowel sounds are normal. She exhibits no distension. There is no tenderness.  Musculoskeletal: Normal range of motion.  Neurological: She is alert and oriented to person, place, and time.  Skin: Skin is warm and dry. No rash noted.  Psychiatric: She has a normal mood and affect. Her behavior is normal.    ED Course  Procedures (including critical care time)  Labs Reviewed  URINALYSIS, MICROSCOPIC ONLY - Abnormal; Notable for the following:    Color, Urine AMBER (*)    APPearance CLOUDY (*)    Bilirubin Urine SMALL (*)    Leukocytes, UA TRACE (*)    Bacteria, UA FEW (*)    Squamous Epithelial / LPF MANY (*)    All other components within normal limits  POCT I-STAT, CHEM 8 - Abnormal; Notable for the following:    Hemoglobin 16.0 (*)    HCT 47.0 (*)    All other components within normal limits  URINE CULTURE  POCT PREGNANCY, URINE   No results found.   1. Acute asthma exacerbation       MDM   27 yo female with acute asthma exacerbation.  Feels much better after getting albuterol.  Will give steroids and second treatment.    Continued to improve after second treatment.  Stable for discharge.       Rennis Petty, MD 09/10/12 2004

## 2012-09-10 NOTE — ED Provider Notes (Signed)
I saw and evaluated the patient, reviewed the resident's note and I agree with the findings and plan.  Pt has no wheezing on my exam.  She improved after treatment in the ED.  Will dc home with inhaler and short course of oral steroid.  Celene Kras, MD 09/10/12 2016

## 2012-09-10 NOTE — ED Notes (Addendum)
C/o sob and wheezing since yesterday.  History of asthma.  States she does not have inhalers.  Also reports nausea and vomiting that started today.  Denies pain.

## 2012-09-12 LAB — URINE CULTURE: Colony Count: >=100000

## 2012-09-13 ENCOUNTER — Telehealth (HOSPITAL_COMMUNITY): Payer: Self-pay | Admitting: Emergency Medicine

## 2012-09-13 NOTE — ED Notes (Signed)
+   Urine Chart sent to EDP office for review. 

## 2012-09-13 NOTE — ED Notes (Signed)
Patient has +Urine culture. °

## 2012-09-14 ENCOUNTER — Telehealth (HOSPITAL_COMMUNITY): Payer: Self-pay | Admitting: Emergency Medicine

## 2012-09-14 NOTE — ED Notes (Signed)
Chart returned from EDP office. Per Robert Browning PA-C, start Keflex 500 mg QID x 7 days. #28. °

## 2012-09-15 ENCOUNTER — Telehealth (HOSPITAL_COMMUNITY): Payer: Self-pay | Admitting: Emergency Medicine

## 2012-09-21 ENCOUNTER — Telehealth (HOSPITAL_COMMUNITY): Payer: Self-pay | Admitting: Emergency Medicine

## 2012-10-19 ENCOUNTER — Emergency Department (HOSPITAL_COMMUNITY): Payer: Self-pay

## 2012-10-19 ENCOUNTER — Telehealth (HOSPITAL_COMMUNITY): Payer: Self-pay | Admitting: Emergency Medicine

## 2012-10-19 ENCOUNTER — Encounter (HOSPITAL_COMMUNITY): Payer: Self-pay | Admitting: *Deleted

## 2012-10-19 ENCOUNTER — Inpatient Hospital Stay (HOSPITAL_COMMUNITY)
Admission: EM | Admit: 2012-10-19 | Discharge: 2012-10-21 | DRG: 917 | Discharge status: Against Medical Advice | Payer: MEDICAID | Attending: Internal Medicine | Admitting: Internal Medicine

## 2012-10-19 DIAGNOSIS — D72829 Elevated white blood cell count, unspecified: Secondary | ICD-10-CM

## 2012-10-19 DIAGNOSIS — I498 Other specified cardiac arrhythmias: Secondary | ICD-10-CM | POA: Diagnosis present

## 2012-10-19 DIAGNOSIS — G92 Toxic encephalopathy: Secondary | ICD-10-CM | POA: Diagnosis present

## 2012-10-19 DIAGNOSIS — K219 Gastro-esophageal reflux disease without esophagitis: Secondary | ICD-10-CM | POA: Diagnosis present

## 2012-10-19 DIAGNOSIS — G929 Unspecified toxic encephalopathy: Secondary | ICD-10-CM | POA: Diagnosis present

## 2012-10-19 DIAGNOSIS — F329 Major depressive disorder, single episode, unspecified: Secondary | ICD-10-CM | POA: Diagnosis present

## 2012-10-19 DIAGNOSIS — F3289 Other specified depressive episodes: Secondary | ICD-10-CM | POA: Diagnosis present

## 2012-10-19 DIAGNOSIS — T401X4A Poisoning by heroin, undetermined, initial encounter: Principal | ICD-10-CM | POA: Diagnosis present

## 2012-10-19 DIAGNOSIS — F17200 Nicotine dependence, unspecified, uncomplicated: Secondary | ICD-10-CM | POA: Diagnosis present

## 2012-10-19 DIAGNOSIS — R0902 Hypoxemia: Secondary | ICD-10-CM | POA: Diagnosis present

## 2012-10-19 DIAGNOSIS — Z7901 Long term (current) use of anticoagulants: Secondary | ICD-10-CM

## 2012-10-19 DIAGNOSIS — T50901A Poisoning by unspecified drugs, medicaments and biological substances, accidental (unintentional), initial encounter: Secondary | ICD-10-CM

## 2012-10-19 DIAGNOSIS — T401X1A Poisoning by heroin, accidental (unintentional), initial encounter: Secondary | ICD-10-CM | POA: Diagnosis present

## 2012-10-19 DIAGNOSIS — E162 Hypoglycemia, unspecified: Secondary | ICD-10-CM | POA: Diagnosis present

## 2012-10-19 DIAGNOSIS — J45909 Unspecified asthma, uncomplicated: Secondary | ICD-10-CM

## 2012-10-19 DIAGNOSIS — IMO0002 Reserved for concepts with insufficient information to code with codable children: Secondary | ICD-10-CM

## 2012-10-19 DIAGNOSIS — I429 Cardiomyopathy, unspecified: Secondary | ICD-10-CM | POA: Diagnosis present

## 2012-10-19 DIAGNOSIS — I4891 Unspecified atrial fibrillation: Secondary | ICD-10-CM | POA: Diagnosis present

## 2012-10-19 LAB — CBC WITH DIFFERENTIAL/PLATELET
Eosinophils Relative: 1 % (ref 0–5)
HCT: 45.2 % (ref 36.0–46.0)
Hemoglobin: 16.2 g/dL — ABNORMAL HIGH (ref 12.0–15.0)
Lymphocytes Relative: 29 % (ref 12–46)
Lymphs Abs: 5.1 10*3/uL — ABNORMAL HIGH (ref 0.7–4.0)
MCV: 89.3 fL (ref 78.0–100.0)
Monocytes Absolute: 1.3 10*3/uL — ABNORMAL HIGH (ref 0.1–1.0)
Monocytes Relative: 8 % (ref 3–12)
Platelets: 317 10*3/uL (ref 150–400)
RBC: 5.06 MIL/uL (ref 3.87–5.11)
WBC: 17.6 10*3/uL — ABNORMAL HIGH (ref 4.0–10.5)

## 2012-10-19 LAB — RAPID URINE DRUG SCREEN, HOSP PERFORMED
Amphetamines: NOT DETECTED
Benzodiazepines: NOT DETECTED
Tetrahydrocannabinol: POSITIVE — AB

## 2012-10-19 LAB — POCT I-STAT, CHEM 8
Creatinine, Ser: 0.9 mg/dL (ref 0.50–1.10)
Glucose, Bld: 65 mg/dL — ABNORMAL LOW (ref 70–99)
Hemoglobin: 16.7 g/dL — ABNORMAL HIGH (ref 12.0–15.0)
Potassium: 4.1 mEq/L (ref 3.5–5.1)

## 2012-10-19 LAB — GLUCOSE, CAPILLARY: Glucose-Capillary: 123 mg/dL — ABNORMAL HIGH (ref 70–99)

## 2012-10-19 LAB — HEPATIC FUNCTION PANEL
Albumin: 4.2 g/dL (ref 3.5–5.2)
Alkaline Phosphatase: 76 U/L (ref 39–117)
Total Bilirubin: 0.3 mg/dL (ref 0.3–1.2)

## 2012-10-19 LAB — URINALYSIS, ROUTINE W REFLEX MICROSCOPIC
Bilirubin Urine: NEGATIVE
Glucose, UA: 100 mg/dL — AB
Hgb urine dipstick: NEGATIVE
Ketones, ur: NEGATIVE mg/dL
pH: 6 (ref 5.0–8.0)

## 2012-10-19 LAB — ETHANOL: Alcohol, Ethyl (B): 65 mg/dL — ABNORMAL HIGH (ref 0–11)

## 2012-10-19 LAB — DIGOXIN LEVEL: Digoxin Level: <0.3 ng/mL — ABNORMAL LOW (ref 0.8–2.0)

## 2012-10-19 LAB — ACETAMINOPHEN LEVEL: Acetaminophen (Tylenol), Serum: <15 ug/mL (ref 10–30)

## 2012-10-19 MED ORDER — ALBUTEROL SULFATE (5 MG/ML) 0.5% IN NEBU: 2.5000 mg | INHALATION_SOLUTION | RESPIRATORY_TRACT | Status: DC | PRN

## 2012-10-19 MED ORDER — ONDANSETRON HCL 4 MG/2ML IJ SOLN
INTRAMUSCULAR | Status: AC
  Administered 2012-10-19: 4 mg via INTRAVENOUS
  Filled 2012-10-19: qty 2

## 2012-10-19 MED ORDER — IPRATROPIUM-ALBUTEROL 0.5-2.5 (3) MG/3ML IN SOLN: 3.0000 mL | RESPIRATORY_TRACT | Status: DC | PRN

## 2012-10-19 MED ORDER — ASPIRIN 81 MG PO CHEW: 324.0000 mg | CHEWABLE_TABLET | ORAL | Status: DC

## 2012-10-19 MED ORDER — PROMETHAZINE HCL 25 MG RE SUPP
25.0000 mg | Freq: Four times a day (QID) | RECTAL | Status: DC | PRN
  Filled 2012-10-19: qty 1

## 2012-10-19 MED ORDER — ALBUTEROL SULFATE (5 MG/ML) 0.5% IN NEBU
2.5000 mg | INHALATION_SOLUTION | Freq: Four times a day (QID) | RESPIRATORY_TRACT | Status: DC
  Administered 2012-10-19 – 2012-10-20 (×3): 2.5 mg via RESPIRATORY_TRACT
  Filled 2012-10-19 (×4): qty 0.5

## 2012-10-19 MED ORDER — NALOXONE HCL 1 MG/ML IJ SOLN
1.0000 mg/h | INTRAVENOUS | Status: DC
  Administered 2012-10-19 (×2): 1 mg/h via INTRAVENOUS
  Filled 2012-10-19 (×3): qty 4

## 2012-10-19 MED ORDER — ONDANSETRON HCL 4 MG/2ML IJ SOLN
INTRAMUSCULAR | Status: AC
  Administered 2012-10-19: 4 mg
  Filled 2012-10-19: qty 2

## 2012-10-19 MED ORDER — ENOXAPARIN SODIUM 40 MG/0.4ML ~~LOC~~ SOLN
40.0000 mg | SUBCUTANEOUS | Status: DC
  Administered 2012-10-19 – 2012-10-20 (×2): 40 mg via SUBCUTANEOUS
  Filled 2012-10-19 (×3): qty 0.4

## 2012-10-19 MED ORDER — THIAMINE HCL 100 MG/ML IJ SOLN
Freq: Once | INTRAVENOUS | Status: AC
  Administered 2012-10-19: 13:00:00 via INTRAVENOUS
  Filled 2012-10-19: qty 1000

## 2012-10-19 MED ORDER — IPRATROPIUM BROMIDE 0.02 % IN SOLN: 0.5000 mg | RESPIRATORY_TRACT | Status: DC | PRN

## 2012-10-19 MED ORDER — DEXTROSE-NACL 5-0.45 % IV SOLN
INTRAVENOUS | Status: DC
  Administered 2012-10-19: 10:00:00 via INTRAVENOUS
  Administered 2012-10-20: 75 mL/h via INTRAVENOUS

## 2012-10-19 MED ORDER — PROMETHAZINE HCL 25 MG PO TABS
25.0000 mg | ORAL_TABLET | Freq: Four times a day (QID) | ORAL | Status: DC | PRN
  Filled 2012-10-19: qty 1

## 2012-10-19 MED ORDER — PROMETHAZINE HCL 25 MG/ML IJ SOLN
25.0000 mg | Freq: Four times a day (QID) | INTRAMUSCULAR | Status: DC | PRN
  Administered 2012-10-19 – 2012-10-20 (×2): 25 mg via INTRAVENOUS
  Filled 2012-10-19 (×3): qty 1

## 2012-10-19 MED ORDER — DEXTROSE 50 % IV SOLN
INTRAVENOUS | Status: AC
  Administered 2012-10-19: 25 mL
  Filled 2012-10-19: qty 50

## 2012-10-19 MED ORDER — ASPIRIN 300 MG RE SUPP
300.0000 mg | RECTAL | Status: DC
  Filled 2012-10-19: qty 1

## 2012-10-19 MED ORDER — ONDANSETRON HCL 4 MG/2ML IJ SOLN: 4.0000 mg | Freq: Once | INTRAMUSCULAR | Status: AC

## 2012-10-19 MED ORDER — SODIUM CHLORIDE 0.9 % IV SOLN: 250.0000 mL | INTRAVENOUS | Status: DC | PRN

## 2012-10-19 NOTE — Progress Notes (Signed)
Utilization Review Completed.   Precilla Purnell, RN, BSN Nurse Case Manager  [phone]  

## 2012-10-19 NOTE — ED Notes (Signed)
No response to letter sent after 30 days. Chart sent to Medical Records. °

## 2012-10-19 NOTE — ED Notes (Signed)
Pt. At home in respiratory distress, pin point pupils, and audible wheezing.  Pt. Has hx. Of atrial fibrillation and chf; EMS placed pt. After pt. Stabilized in ED and awake she states, "Father called EMS per her request b/c of anxiousness." EMS gave a total of 1 mg of Narcan (0.5 mg doses) with positive results. Pt. Still somnolent upon arrival.

## 2012-10-19 NOTE — ED Provider Notes (Signed)
History     CSN: [id number]  Arrival date & time 10/19/12  0444   First MD Initiated Contact with Patient 10/19/12 [phone]      Chief Complaint  Patient presents with  . Drug Overdose    (Consider location/radiation/quality/duration/timing/severity/associated sxs/prior treatment) Patient is a 27 y.o. female presenting with Overdose. The history is provided by the EMS personnel. The history is limited by the condition of the patient. No language interpreter was used.  Drug Overdose This is a new problem. The current episode started less than 1 hour ago. The problem occurs constantly. The problem has not changed since onset.Pertinent negatives include no chest pain and no abdominal pain. Associated symptoms comments: Wheezing and hypoxia. Nothing aggravates the symptoms. Nothing relieves the symptoms. Treatments tried: narcan and nebs. The treatment provided significant relief.    Past Medical History  Diagnosis Date  . Asthma   . Atrial fibrillation     LV function lower limit of normal   . GERD (gastroesophageal reflux disease)   . Depression     Past Surgical History  Procedure Laterality Date  . Heart ablation      Family History  Problem Relation Age of Onset  . Asthma Brother   . Hypertension Mother   . Hypertension Maternal Grandmother     History  Substance Use Topics  . Smoking status: Current Every Day Smoker    Types: Cigarettes  . Smokeless tobacco: Not on file     Comment: since 2002; smokes about 8 cig/day   . Alcohol Use: Yes     Comment: weekends     OB History   Grav Para Term Preterm Abortions TAB SAB Ect Mult Living   5 2  2 3 2 1   2       Review of Systems  Unable to perform ROS Cardiovascular: Negative for chest pain.  Gastrointestinal: Negative for abdominal pain.    Allergies  Review of patient's allergies indicates no known allergies.  Home Medications   Current Outpatient Rx  Name  Route  Sig  Dispense  Refill  . albuterol  (PROVENTIL HFA;VENTOLIN HFA) 108 (90 BASE) MCG/ACT inhaler   Inhalation   Inhale 1-2 puffs into the lungs every 6 (six) hours as needed for wheezing.   1 Inhaler   0   . sotalol (BETAPACE) 120 MG tablet   Oral   Take 120 mg by mouth 2 (two) times daily.         . digoxin (LANOXIN) 0.25 MG tablet   Oral   Take 1 tablet (0.25 mg total) by mouth daily.   30 tablet   1     Patient needs to call for appointment   . ipratropium-albuterol (DUONEB) 0.5-2.5 (3) MG/3ML SOLN   Nebulization   Take 3 mLs by nebulization every 4 (four) hours as needed (for wheezing and shortness of breath.). Pharmacy may substitute for generic equivalents           BP 130/83  Pulse 60  SpO2 99%  LMP 09/28/2012  Physical Exam  Constitutional: She appears well-developed and well-nourished.  HENT:  Head: Normocephalic and atraumatic. Head is without raccoon's eyes and without Battle's sign.  Right Ear: External ear normal. No mastoid tenderness.  Left Ear: External ear normal. No mastoid tenderness.  Mouth/Throat: Oropharynx is clear and moist. No oropharyngeal exudate.  Eyes: Conjunctivae are normal.  Pinpoint pupils B positive response to narcan  Neck: Normal range of motion. Neck supple.  Cardiovascular: Bradycardia present.  Pulmonary/Chest: She has wheezes.  Abdominal: Soft. Bowel sounds are normal. There is no tenderness. There is no rebound and no guarding.  Musculoskeletal: Normal range of motion. She exhibits no edema.  Neurological: She has normal reflexes.  Skin: Skin is warm and dry.    ED Course  Procedures (including critical care time)  Labs Reviewed  GLUCOSE, CAPILLARY - Abnormal; Notable for the following:    Glucose-Capillary 62 (*)    All other components within normal limits  CBC WITH DIFFERENTIAL - Abnormal; Notable for the following:    WBC 17.6 (*)    Hemoglobin 16.2 (*)    Neutro Abs 10.8 (*)    Lymphs Abs 5.1 (*)    Monocytes Absolute 1.3 (*)    All other  components within normal limits  ETHANOL - Abnormal; Notable for the following:    Alcohol, Ethyl (B) 65 (*)    All other components within normal limits  URINE RAPID DRUG SCREEN (HOSP PERFORMED) - Abnormal; Notable for the following:    Cocaine POSITIVE (*)    Tetrahydrocannabinol POSITIVE (*)    All other components within normal limits  DIGOXIN LEVEL - Abnormal; Notable for the following:    Digoxin Level <0.3 (*)    All other components within normal limits  POCT I-STAT, CHEM 8 - Abnormal; Notable for the following:    Glucose, Bld 65 (*)    Hemoglobin 16.7 (*)    HCT 49.0 (*)    All other components within normal limits  PROTIME-INR  URINALYSIS, ROUTINE W REFLEX MICROSCOPIC  POCT I-STAT TROPONIN I  POCT PREGNANCY, URINE   Dg Chest Portable 1 View  10/19/2012   *RADIOLOGY REPORT*  Clinical Data: Altered mental status. Shortness of breath.  PORTABLE CHEST - 1 VIEW  Comparison: 04/24/2012  Findings: Cardiac and mediastinal contours appear normal.  The lungs appear clear.  No pleural effusion is identified.  IMPRESSION:  No significant abnormality identified.   Original Report Authenticated By: Gaylyn Rong, M.D.     No diagnosis found.    MDM   Date: 72  Rhythm: sinus bradycardia  QRS Axis: normal  Intervals: normal  ST/T Wave abnormalities: normal  Conduction Disutrbances: none  Narrative Interpretation: unremarkable      Medications  naloxone (NARCAN) 4 mg in dextrose 5 % 250 mL infusion (1 mg/hr Intravenous New Bag/Given 10/19/12 0533)  dextrose 50 % solution (25 mLs  Given 10/19/12 0501)  ondansetron (ZOFRAN) injection 4 mg (4 mg Intravenous Given 10/19/12 0501)    Results for orders placed during the hospital encounter of 10/19/12  GLUCOSE, CAPILLARY      Result Value Range   Glucose-Capillary 62 (*) 70 - 99 mg/dL   Comment 1 Documented in Chart     Comment 2 Notify RN    CBC WITH DIFFERENTIAL      Result Value Range   WBC 17.6 (*) 4.0 - 10.5 K/uL    RBC 5.06  3.87 - 5.11 MIL/uL   Hemoglobin 16.2 (*) 12.0 - 15.0 g/dL   HCT 16.1  09.6 - 04.5 %   MCV 89.3  78.0 - 100.0 fL   MCH 32.0  26.0 - 34.0 pg   MCHC 35.8  30.0 - 36.0 g/dL   RDW 40.9  81.1 - 91.4 %   Platelets 317  150 - 400 K/uL   Neutrophils Relative % 62  43 - 77 %   Neutro Abs 10.8 (*) 1.7 - 7.7 K/uL   Lymphocytes Relative 29  12 - 46 %   Lymphs Abs 5.1 (*) 0.7 - 4.0 K/uL   Monocytes Relative 8  3 - 12 %   Monocytes Absolute 1.3 (*) 0.1 - 1.0 K/uL   Eosinophils Relative 1  0 - 5 %   Eosinophils Absolute 0.3  0.0 - 0.7 K/uL   Basophils Relative 1  0 - 1 %   Basophils Absolute 0.1  0.0 - 0.1 K/uL  ETHANOL      Result Value Range   Alcohol, Ethyl (B) 65 (*) 0 - 11 mg/dL  URINE RAPID DRUG SCREEN (HOSP PERFORMED)      Result Value Range   Opiates NONE DETECTED  NONE DETECTED   Cocaine POSITIVE (*) NONE DETECTED   Benzodiazepines NONE DETECTED  NONE DETECTED   Amphetamines NONE DETECTED  NONE DETECTED   Tetrahydrocannabinol POSITIVE (*) NONE DETECTED   Barbiturates NONE DETECTED  NONE DETECTED  DIGOXIN LEVEL      Result Value Range   Digoxin Level <0.3 (*) 0.8 - 2.0 ng/mL  PROTIME-INR      Result Value Range   Prothrombin Time 14.2  11.6 - 15.2 seconds   INR 1.11  0.00 - 1.49  POCT I-STAT, CHEM 8      Result Value Range   Sodium 139  135 - 145 mEq/L   Potassium 4.1  3.5 - 5.1 mEq/L   Chloride 100  96 - 112 mEq/L   BUN 14  6 - 23 mg/dL   Creatinine, Ser 1.61  0.50 - 1.10 mg/dL   Glucose, Bld 65 (*) 70 - 99 mg/dL   Calcium, Ion 0.96  0.45 - 1.23 mmol/L   TCO2 31  0 - 100 mmol/L   Hemoglobin 16.7 (*) 12.0 - 15.0 g/dL   HCT 40.9 (*) 81.1 - 91.4 %  POCT I-STAT TROPONIN I      Result Value Range   Troponin i, poc 0.00  0.00 - 0.08 ng/mL   Comment 3           POCT PREGNANCY, URINE      Result Value Range   Preg Test, Ur NEGATIVE  NEGATIVE   Dg Chest Portable 1 View  10/19/2012   *RADIOLOGY REPORT*  Clinical Data: Altered mental status. Shortness of breath.   PORTABLE CHEST - 1 VIEW  Comparison: 04/24/2012  Findings: Cardiac and mediastinal contours appear normal.  The lungs appear clear.  No pleural effusion is identified.  IMPRESSION:  No significant abnormality identified.   Original Report Authenticated By: Gaylyn Rong, M.D.    MDM Number of Diagnoses or Management Options Overdose, initial encounter:  Critical Care Total time providing critical care: 30-74 minutes  MDM Reviewed: nursing note and vitals Interpretation: labs, ECG and x-ray Total time providing critical care: 30-74 minutes. This excludes time spent performing separately reportable procedures and services. Consults: pulmonary    CRITICAL CARE Performed by: Jasmine Awe Total critical care time: 60 minutes Critical care time was exclusive of separately billable procedures and treating other patients. Critical care was necessary to treat or prevent imminent or life-threatening deterioration. Critical care was time spent personally by me on the following activities: development of treatment plan with patient and/or surrogate as well as nursing, discussions with consultants, evaluation of patient's response to treatment, examination of patient, obtaining history from patient or surrogate, ordering and performing treatments and interventions, ordering and review of laboratory studies, ordering and review of radiographic studies, pulse oximetry and re-evaluation of patient's condition.    Mairin Lindsley  Smitty Cords, MD 10/19/12 [phone]

## 2012-10-19 NOTE — Progress Notes (Signed)
Placed on mrsa isolation precautions pr policy. positve pcr.no family at present.

## 2012-10-19 NOTE — ED Notes (Signed)
Critical MD at bedside. 

## 2012-10-19 NOTE — ED Notes (Signed)
Pt's CBG was 96 when I checked it.7:22 am JG.

## 2012-10-19 NOTE — ED Notes (Signed)
New and old EKG given to Dr. Nicanor Alcon. Copies placed in pt chart.

## 2012-10-19 NOTE — H&P (Signed)
PULMONARY  / CRITICAL CARE MEDICINE  Name: [name] MRN: [id number] DOB: 11-28-1985    ADMISSION DATE:  10/19/2012 CONSULTATION DATE:  10/19/2012   REFERRING MD :  Nicanor Alcon PRIMARY SERVICE: PCCM  CHIEF COMPLAINT:  Overdose  BRIEF PATIENT DESCRIPTION: 27 y/o female with a history of A-fib was brought by EMS for heroin overdose responsive to a narcan gtt.  SIGNIFICANT EVENTS / STUDIES:    LINES / TUBES:   CULTURES:   ANTIBIOTICS:   HISTORY OF PRESENT ILLNESS:  27 y/o female with a history of A-fib was brought by EMS for overdose responsive to a narcan gtt.  No friends of family are able to give history and the patient is too somnolent for an interview.  She was brought in by EMS unresponsive and wheezing and hypoxemic on a NRB mask.  She initially responded to a narcan push but ultimately required a narcan gtt.  Her wheezing improved with albuterol.  She has a history of a-fib and supposedly takes digoxin but her dig level was undetectable in the ED.    PAST MEDICAL HISTORY :  Past Medical History  Diagnosis Date  . Asthma   . Atrial fibrillation     LV function lower limit of normal   . GERD (gastroesophageal reflux disease)   . Depression    Past Surgical History  Procedure Laterality Date  . Heart ablation     Prior to Admission medications   Medication Sig Start Date End Date Taking? Authorizing Provider  albuterol (PROVENTIL HFA;VENTOLIN HFA) 108 (90 BASE) MCG/ACT inhaler Inhale 1-2 puffs into the lungs every 6 (six) hours as needed for wheezing. 09/10/12  Yes Rennis Petty, MD  sotalol (BETAPACE) 120 MG tablet Take 120 mg by mouth 2 (two) times daily. 09/18/11  Yes Duke Salvia, MD  digoxin (LANOXIN) 0.25 MG tablet Take 1 tablet (0.25 mg total) by mouth daily. 10/08/11   Duke Salvia, MD  ipratropium-albuterol (DUONEB) 0.5-2.5 (3) MG/3ML SOLN Take 3 mLs by nebulization every 4 (four) hours as needed (for wheezing and shortness of breath.). Pharmacy may  substitute for generic equivalents 04/29/12   Clanford Cyndie Mull, MD   No Known Allergies  FAMILY HISTORY:  Family History  Problem Relation Age of Onset  . Asthma Brother   . Hypertension Mother   . Hypertension Maternal Grandmother    SOCIAL HISTORY:  reports that she has been smoking Cigarettes.  She has been smoking about 0.00 packs per day. She does not have any smokeless tobacco history on file. She reports that  drinks alcohol. She reports that she uses illicit drugs (Marijuana).  REVIEW OF SYSTEMS:  Cannot obtain due to mental status changes  SUBJECTIVE:   VITAL SIGNS: Pulse Rate:  [53-60] 60 (05/18 0715) Resp:  [10-13] 13 (05/18 0715) BP: (112-130)/(72-94) 130/87 mmHg (05/18 0715) SpO2:  [97 %-100 %] 100 % (05/18 0715) FiO2 (%):  [99 %] 99 % (05/18 0500) HEMODYNAMICS:   VENTILATOR SETTINGS: Vent Mode:  [-]  FiO2 (%):  [99 %] 99 % INTAKE / OUTPUT: Intake/Output     05/17 0701 - 05/18 0700 05/18 0701 - 05/19 0700   Urine 265    Total Output 265     Net -265            PHYSICAL EXAMINATION:  Gen: somnolent, follows commands HEENT: NCAT, pupils dilated, round, responsive to light PULM: CTA B CV: Bradycardic, no mgr, no JVD AB: BS+, soft, nontender, no hsm Ext: warm,  no edema, no clubbing, no cyanosis Derm: multiple tattoos Neuro: Somnolent but arouses, answers one word answers, maew, follows commands   LABS:  Recent Labs Lab 10/19/12 0515 10/19/12 0534  HGB 16.7* 16.2*  WBC  --  17.6*  PLT  --  317  NA 139  --   K 4.1  --   CL 100  --   GLUCOSE 65*  --   BUN 14  --   CREATININE 0.90  --   INR  --  1.11    Recent Labs Lab 10/19/12 0449 10/19/12 0718  GLUCAP 62* 96    5/18 CXR: Clear lung fields, no infiltrate 5/18 EKG: Sinus brady, no ST wave changed   ASSESSMENT / PLAN:   TOXICOLOGY: A: Heroin overdose, also with evidence of cocaine use; currently responding to narcan gtt, but with bradycardia and wheezing it is reasonable to  admit to the ICU P: -continue narcan gtt -thiamine, folate, MVI -supportive care  NEUROLOGIC A:  Acute encephalopathy due to drug overdose P:   -ICU monitoring  PULMONARY A: Asthma > wheezing has responded to bronchodilators, possibly related to cocaine intake P:   -scheduled and prn albuterol -wean O2  CARDIOVASCULAR A: Sinus bradycardia likely related to drug overdose History of afib on digoxin and sotalol, past history of ablation?  P:  -hold dig/sotaolol 5/18, then resume on 5/19 -tele  RENAL A:  No acute issues P:   -monitor UOP -D5 1/2 NS  GASTROINTESTINAL A:  No acute issues P:   -npo  HEMATOLOGIC A:  No acute issues P:    INFECTIOUS A:  Leukocytosis, no clear evidence of infection P:   -check u/a -monitor fever curve  ENDOCRINE A:  Hypoglycemia: related to overdose? P:   -D5 1/2 NS and CBG checks    TODAY'S SUMMARY: 27 y/o female with heroin overdose, bradycardia and wheezing.  Responding to narcan gtt and supportive care.  Admit to ICU for observation.  I have personally obtained a history, examined the patient, evaluated laboratory and imaging results, formulated the assessment and plan and placed orders. CRITICAL CARE: The patient is critically ill with multiple organ systems failure and requires high complexity decision making for assessment and support, frequent evaluation and titration of therapies, application of advanced monitoring technologies and extensive interpretation of multiple databases. Critical Care Time devoted to patient care services described in this note is 45 minutes.   Fonnie Jarvis Pulmonary and Critical Care Medicine [facility] Pager: [phone]  10/19/2012, 7:23 AM

## 2012-10-20 DIAGNOSIS — Z5189 Encounter for other specified aftercare: Secondary | ICD-10-CM

## 2012-10-20 LAB — GLUCOSE, CAPILLARY
Glucose-Capillary: 110 mg/dL — ABNORMAL HIGH (ref 70–99)
Glucose-Capillary: 120 mg/dL — ABNORMAL HIGH (ref 70–99)
Glucose-Capillary: 122 mg/dL — ABNORMAL HIGH (ref 70–99)
Glucose-Capillary: 85 mg/dL (ref 70–99)

## 2012-10-20 LAB — BASIC METABOLIC PANEL
BUN: 6 mg/dL (ref 6–23)
CO2: 29 mEq/L (ref 19–32)
CO2: 26 mEq/L (ref 19–32)
Calcium: 8.4 mg/dL (ref 8.4–10.5)
Glucose, Bld: 104 mg/dL — ABNORMAL HIGH (ref 70–99)
Glucose, Bld: 94 mg/dL (ref 70–99)
Potassium: 3.6 mEq/L (ref 3.5–5.1)
Sodium: 137 mEq/L (ref 135–145)
Sodium: 140 mEq/L (ref 135–145)

## 2012-10-20 LAB — CBC
Hemoglobin: 13.9 g/dL (ref 12.0–15.0)
MCH: 30.8 pg (ref 26.0–34.0)
RBC: 4.52 MIL/uL (ref 3.87–5.11)

## 2012-10-20 MED ORDER — POTASSIUM CHLORIDE 10 MEQ/100ML IV SOLN
10.0000 meq | INTRAVENOUS | Status: DC
  Administered 2012-10-20: 10 meq via INTRAVENOUS

## 2012-10-20 MED ORDER — METOCLOPRAMIDE HCL 5 MG/ML IJ SOLN
10.0000 mg | Freq: Three times a day (TID) | INTRAMUSCULAR | Status: DC
  Administered 2012-10-20 – 2012-10-21 (×3): 10 mg via INTRAVENOUS
  Filled 2012-10-20 (×7): qty 2

## 2012-10-20 MED ORDER — MUPIROCIN 2 % EX OINT
TOPICAL_OINTMENT | Freq: Two times a day (BID) | CUTANEOUS | Status: DC
  Administered 2012-10-20: 23:00:00 via NASAL
  Administered 2012-10-20: 1 via NASAL
  Filled 2012-10-20 (×3): qty 22

## 2012-10-20 MED ORDER — POTASSIUM CHLORIDE CRYS ER 20 MEQ PO TBCR
40.0000 meq | EXTENDED_RELEASE_TABLET | Freq: Once | ORAL | Status: AC
  Administered 2012-10-20: 20 meq via ORAL

## 2012-10-20 MED ORDER — POTASSIUM CHLORIDE CRYS ER 20 MEQ PO TBCR
40.0000 meq | EXTENDED_RELEASE_TABLET | ORAL | Status: AC
  Administered 2012-10-20: 40 meq via ORAL
  Filled 2012-10-20 (×2): qty 2

## 2012-10-20 MED ORDER — POTASSIUM CHLORIDE CRYS ER 20 MEQ PO TBCR
EXTENDED_RELEASE_TABLET | ORAL | Status: AC
  Administered 2012-10-20: 20 meq
  Filled 2012-10-20: qty 2

## 2012-10-20 NOTE — H&P (Signed)
PULMONARY  / CRITICAL CARE MEDICINE  Name: [name] MRN: [id number] DOB: 09-07-1985    ADMISSION DATE:  10/19/2012 CONSULTATION DATE:  10/19/2012   REFERRING MD :  Nicanor Alcon PRIMARY SERVICE: PCCM  CHIEF COMPLAINT:  Overdose  BRIEF PATIENT DESCRIPTION: 27 y/o female with a history of A-fib was brought by EMS for heroin overdose responsive to a narcan gtt.  SIGNIFICANT EVENTS / STUDIES:    LINES / TUBES:   CULTURES:   ANTIBIOTICS:   HISTORY OF PRESENT ILLNESS:  27 y/o female with a history of A-fib was brought by EMS for overdose responsive to a narcan gtt.  No friends of family are able to give history and the patient is too somnolent for an interview.  She was brought in by EMS unresponsive and wheezing and hypoxemic on a NRB mask.  She initially responded to a narcan push but ultimately required a narcan gtt.  Her wheezing improved with albuterol.  She has a history of a-fib and supposedly takes digoxin but her dig level was undetectable in the ED.     SUBJECTIVE:  10/20/2012: Off narcan for 24 hours. RASS sedation score 0. CAM-ICU negative for delirium. Nursing reports severe nausea and vomiting with retching. Unable to take anything oral. Compazine and Zofran not helping with nausea   VITAL SIGNS: Temp:  [98.2 F (36.8 C)-99.2 F (37.3 C)] 98.5 F (36.9 C) (05/19 0814) Pulse Rate:  [48-82] 70 (05/19 1100) Resp:  [9-26] 19 (05/19 1100) BP: (92-139)/(47-100) 124/90 mmHg (05/19 1100) SpO2:  [91 %-99 %] 95 % (05/19 1100) Weight:  [53.9 kg (118 lb 13.3 oz)] 53.9 kg (118 lb 13.3 oz) (05/19 0814) HEMODYNAMICS:   VENTILATOR SETTINGS:   INTAKE / OUTPUT: Intake/Output     05/18 0701 - 05/19 0700 05/19 0701 - 05/20 0700   P.O. 240    I.V. (mL/kg) 1700 (31.5) 225 (4.2)   Total Intake(mL/kg) 1940 (36) 225 (4.2)   Urine (mL/kg/hr) 1100 (0.9)    Emesis/NG output  200 (0.8)   Total Output 1100 200   Net +840 +25        Emesis Occurrence  1 x     PHYSICAL  EXAMINATION:  Gen: Thin female currently sleeping but arousable  HEENT: NCAT, pupils dilated, round, responsive to light PULM: CTA B CV: Bradycardic, no mgr, no JVD AB: BS+, soft, nontender, no hsm Ext: warm, no edema, no clubbing, no cyanosis Derm: multiple tattoos Neuro: Earlier today RASS 0 when CAM-ICU negative for delirium. Currently sleeping.   LABS:  Recent Labs Lab 10/19/12 0515 10/19/12 0534 10/20/12 0355  HGB 16.7* 16.2* 13.9  WBC  --  17.6* 13.1*  PLT  --  317 246  NA 139  --  137  K 4.1  --  2.7*  CL 100  --  102  CO2  --   --  29  GLUCOSE 65*  --  104*  BUN 14  --  9  CREATININE 0.90  --  0.77  CALCIUM  --   --  8.4  AST  --  30  --   ALT  --  17  --   ALKPHOS  --  76  --   BILITOT  --  0.3  --   PROT  --  7.7  --   ALBUMIN  --  4.2  --   INR  --  1.11  --     Recent Labs Lab 10/19/12 1513 10/19/12 1927 10/19/12 2346 10/20/12 0335  10/20/12 0803  GLUCAP 88 77 120* 110* 85    Recent Labs Lab 10/19/12 0515  TCO2 31    5/18 CXR: Clear lung fields, no infiltrate 5/18 EKG: Sinus brady, no ST wave changed   ASSESSMENT / PLAN:   TOXICOLOGY: A: Heroin overdose, also with evidence of cocaine use; currently responding to narcan gtt, but with bradycardia and wheezing it is reasonable to admit to the ICU 10/20/2012: Off narcan  24 hours.  P: -Monitor without Narcan; check ABG -thiamine, folate, MVI -supportive care  NEUROLOGIC A:  Acute encephalopathy due to drug overdose 10/20/2012: Improved P:   -ICU monitoring  PULMONARY A: Asthma > wheezing has responded to bronchodilators, possibly related to cocaine intake P:   -scheduled and prn albuterol -wean O2  CARDIOVASCULAR A: Sinus bradycardia likely related to drug overdose History of afib on digoxin and sotalol, past history of ablation?  - 10/20/2012: Clinically euvolemic  P:  -hold dig/sotaolol 5/18 and 10/20/2012, then resume on 10/21/2012-tele  RENAL  Recent Labs Lab  10/19/12 0515 10/20/12 0355  NA 139 137  K 4.1 2.7*  CL 100 102  CO2  --  29  GLUCOSE 65* 104*  BUN 14 9  CREATININE 0.90 0.77  CALCIUM  --  8.4   Intake/Output     05/18 0701 - 05/19 0700 05/19 0701 - 05/20 0700   P.O. 240    I.V. (mL/kg) 1700 (31.5) 225 (4.2)   Total Intake(mL/kg) 1940 (36) 225 (4.2)   Urine (mL/kg/hr) 1100 (0.9)    Emesis/NG output  200 (0.8)   Total Output 1100 200   Net +840 +25        Emesis Occurrence  1 x     A:  No acute issues other than mild hypokalemia P:   -monitor UOP -D5 1/2 NS -Replete KCL via IV  GASTROINTESTINAL  Recent Labs Lab 10/19/12 0534  AST 30  ALT 17  ALKPHOS 76  BILITOT 0.3  PROT 7.7  ALBUMIN 4.2  INR 1.11    A:  N severe nausea and vomiting refractory to Zofran and Compazine  P:   -npo - Start schedule Reglan  HEMATOLOGIC  Recent Labs Lab 10/19/12 0515 10/19/12 0534 10/20/12 0355  HGB 16.7* 16.2* 13.9  HCT 49.0* 45.2 40.7  WBC  --  17.6* 13.1*  PLT  --  317 246    Recent Labs Lab 10/19/12 0534  INR 1.11    A:  No acute issues P:    INFECTIOUS No results found for this basename: LATICACIDVEN, PROCALCITON,  in the last 168 hours  Recent Labs Lab 10/19/12 0534 10/20/12 0355  WBC 17.6* 13.1*    A:  Leukocytosis, no clear evidence of infection P:   -check u/a -monitor fever curve  ENDOCRINE A:  Hypoglycemia: related to overdose? P:   -D5 1/2 NS and CBG checks    TODAY'S SUMMARY: 27 y/o female with heroin overdose, bradycardia and wheezing.  Responding to narcan gtt and supportive care but still overall status is tenuous in need ICU care    I have personally obtained a history, examined the patient, evaluated laboratory and imaging results, formulated the assessment and plan and placed orders. CRITICAL CARE: The patient is critically ill with multiple organ systems failure and requires high complexity decision making for assessment and support, frequent evaluation and titration  of therapies, application of advanced monitoring technologies and extensive interpretation of multiple databases. Critical Care Time devoted to patient care services described in this  note is 45 minutes.     Dr. Kalman Shan, M.D., [facility].C.P Pulmonary and Critical Care Medicine Staff Physician [facility] [place] Pulmonary and Critical Care Pager: [phone], If no answer or between  15:00h - 7:00h: call [phone]  10/20/2012 11:41 AM

## 2012-10-20 NOTE — Progress Notes (Deleted)
Pt HR started to brady while asleep. HR to 40's. ECG revealed 2nd deg type 1 heart block.[name]). Replaced K and will continue to monitor.

## 2012-10-20 NOTE — Progress Notes (Signed)
Several issues  1. Hx - Per Milas Hock RN. Heroin OD is not intentional. 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 So not suicidal and no need for psych consut  2. High risk occupation - will check HIV 10/21/12  3.   Recent Labs Lab 10/19/12 0515 10/20/12 0355  NA 139 137  K 4.1 2.7*  CL 100 102  CO2  --  29  GLUCOSE 65* 104*  BUN 14 9  CREATININE 0.90 0.77  CALCIUM  --  8.4   Low K - does not want IV. So changed to PO. Will check mag and phos. RN to communicated with elink   4. Wanting to go AMA - agreed to stay if tx to tele with view towards dc 10/21/12  5. Patient given to triad for 10/21/12 ./ D/w Jill Side of triad   Dr. Kalman Shan, M.D., [facility]C.P Pulmonary and Critical Care Medicine Staff Physician [place] [facility] [place] Pulmonary and Critical Care Pager: [phone], If no answer or between  15:00h - 7:00h: call [phone]  10/20/2012 4:00 PM

## 2012-10-20 NOTE — Progress Notes (Signed)
In conversation with [place], she stated she had taken some fentanyl laced heroin.  She stated "everyone else did a whole line and I only did half of a line and fell out".  She denies attempting to kill herself.  Dr. Marchelle Gearing was notifed of this.

## 2012-10-20 NOTE — Progress Notes (Signed)
MD at the [facility] notified pt's HR brady down to 43 with MAP 60. Continue to monitor per MD.

## 2012-10-20 NOTE — Progress Notes (Signed)
Clinical Social Worker received referral for "Substance Abuse".  Pt currently asleep-CSW to attempt intervention when pt able to fully participate.    Angelia Mould, MSW, [place] [phone]

## 2012-10-21 LAB — CBC WITH DIFFERENTIAL/PLATELET
Basophils Absolute: 0.1 10*3/uL (ref 0.0–0.1)
Basophils Relative: 0 % (ref 0–1)
Eosinophils Relative: 0 % (ref 0–5)
HCT: 44.9 % (ref 36.0–46.0)
Hemoglobin: 15.5 g/dL — ABNORMAL HIGH (ref 12.0–15.0)
Lymphocytes Relative: 23 % (ref 12–46)
MCHC: 34.5 g/dL (ref 30.0–36.0)
MCV: 89.8 fL (ref 78.0–100.0)
Monocytes Absolute: 1.3 10*3/uL — ABNORMAL HIGH (ref 0.1–1.0)
Monocytes Relative: 11 % (ref 3–12)
Neutro Abs: 7.6 10*3/uL (ref 1.7–7.7)
RDW: 12.7 % (ref 11.5–15.5)

## 2012-10-21 LAB — BASIC METABOLIC PANEL
BUN: 7 mg/dL (ref 6–23)
CO2: 27 mEq/L (ref 19–32)
Calcium: 9.6 mg/dL (ref 8.4–10.5)
Chloride: 104 mEq/L (ref 96–112)
Creatinine, Ser: 0.82 mg/dL (ref 0.50–1.10)

## 2012-10-21 LAB — MAGNESIUM: Magnesium: 2.1 mg/dL (ref 1.5–2.5)

## 2012-10-21 NOTE — Progress Notes (Signed)
Pt signed herself out AMA. She stated she had to meet her parole officer. She took her IV out prior to me getting here. She signed paper. Dr. Robb Matar notified. Pt ambulated herself out with her boyfriend

## 2012-10-28 NOTE — Discharge Summary (Signed)
DISCHARGE SUMMARY    Date of admit: 10/19/2012  4:45 AM Date of discharge: 10/21/2012  8:14 AM Length of Stay: 2 days  PCP is Default, Provider, MD   PROBLEM LIST Principal Problem:   Overdose of heroin Active Problems:   CARDIOMYOPATHY, SECONDARY    SUMMARY [name] was 27 y.o. patient admitted on 10/19/2012 admited with heroin overdose to ICU needing narcan drip but did not need ventilations with mechanical ventilator. She improved and was transferred to floor but on 10/21/2012 she signed herself out against medical advice. HIV status negative      SIGNED Dr. Kalman Shan, M.D., [facility].C.P Pulmonary and Critical Care Medicine Staff Physician [facility] [place] [facility] Pager: [phone], If no answer or between  15:00h - 7:00h: call [phone]  10/28/2012 10:17 AM

## 2012-11-06 ENCOUNTER — Other Ambulatory Visit: Payer: Self-pay | Admitting: Internal Medicine

## 2012-11-10 NOTE — Telephone Encounter (Signed)
Patient has not been seen in the office since 2009.  [facility] called regarding this refill.  Rose sent it to me - I attempted to call the patient and phone number we have is not a working number.

## 2012-11-11 ENCOUNTER — Telehealth: Payer: Self-pay | Admitting: *Deleted

## 2012-11-11 NOTE — Telephone Encounter (Signed)
Two other refill request received for the patient for prednisone and lanoxin. I left a message on the [facility] refill line that we do not fill prednisone and are denying her lanoxin due to her not being seen since 2010. Patient will need an office visit prior to filling any medications.

## 2012-11-11 NOTE — Telephone Encounter (Signed)
Refill request received for Ventolin RX. I called the pharmacy and left a message on the refill line that we do not prescribe this medication and have not seen the patient since 2010. RX will need to be obtained through her PCP.

## 2012-12-12 NOTE — ED Notes (Signed)
Patient had multiple questions about how to get her medications , as she is not working , does not have insurance. Referred to Adult care clinic, [facility], [place] as resource for poss assistance in get her medications

## 2012-12-20 ENCOUNTER — Other Ambulatory Visit: Payer: Self-pay | Admitting: Internal Medicine

## 2012-12-22 NOTE — Telephone Encounter (Signed)
i saw the phone msg about denial for lanoxin, i wanted to know if we should deny this one too since she hasnt been seen in 4 years?

## 2012-12-23 NOTE — Telephone Encounter (Signed)
Yes

## 2013-03-24 ENCOUNTER — Emergency Department (HOSPITAL_COMMUNITY)
Admission: EM | Admit: 2013-03-24 | Discharge: 2013-03-24 | Disposition: A | Discharge status: Home / Self Care | Payer: Medicaid Other | Attending: Emergency Medicine | Admitting: Emergency Medicine

## 2013-03-24 ENCOUNTER — Emergency Department (HOSPITAL_COMMUNITY): Payer: Medicaid Other

## 2013-03-24 ENCOUNTER — Encounter (HOSPITAL_COMMUNITY): Payer: Self-pay | Admitting: Emergency Medicine

## 2013-03-24 DIAGNOSIS — Z8719 Personal history of other diseases of the digestive system: Secondary | ICD-10-CM | POA: Insufficient documentation

## 2013-03-24 DIAGNOSIS — J45909 Unspecified asthma, uncomplicated: Secondary | ICD-10-CM

## 2013-03-24 DIAGNOSIS — Z760 Encounter for issue of repeat prescription: Secondary | ICD-10-CM

## 2013-03-24 DIAGNOSIS — I4891 Unspecified atrial fibrillation: Secondary | ICD-10-CM | POA: Insufficient documentation

## 2013-03-24 DIAGNOSIS — Z8659 Personal history of other mental and behavioral disorders: Secondary | ICD-10-CM | POA: Insufficient documentation

## 2013-03-24 DIAGNOSIS — R002 Palpitations: Secondary | ICD-10-CM | POA: Insufficient documentation

## 2013-03-24 DIAGNOSIS — J45901 Unspecified asthma with (acute) exacerbation: Secondary | ICD-10-CM | POA: Insufficient documentation

## 2013-03-24 DIAGNOSIS — F17200 Nicotine dependence, unspecified, uncomplicated: Secondary | ICD-10-CM | POA: Insufficient documentation

## 2013-03-24 DIAGNOSIS — Z79899 Other long term (current) drug therapy: Secondary | ICD-10-CM | POA: Insufficient documentation

## 2013-03-24 MED ORDER — IPRATROPIUM BROMIDE 0.02 % IN SOLN
0.5000 mg | Freq: Once | RESPIRATORY_TRACT | Status: AC
  Administered 2013-03-24: 0.5 mg via RESPIRATORY_TRACT
  Filled 2013-03-24: qty 2.5

## 2013-03-24 MED ORDER — SOTALOL HCL 120 MG PO TABS: 120.0000 mg | ORAL_TABLET | Freq: Two times a day (BID) | ORAL | Status: DC

## 2013-03-24 MED ORDER — PREDNISONE 20 MG PO TABS: 40.0000 mg | ORAL_TABLET | Freq: Every day | ORAL | Status: DC

## 2013-03-24 MED ORDER — ALBUTEROL SULFATE (5 MG/ML) 0.5% IN NEBU
5.0000 mg | INHALATION_SOLUTION | Freq: Once | RESPIRATORY_TRACT | Status: AC
  Administered 2013-03-24: 5 mg via RESPIRATORY_TRACT
  Filled 2013-03-24: qty 1

## 2013-03-24 MED ORDER — ALBUTEROL (5 MG/ML) CONTINUOUS INHALATION SOLN
15.0000 mg | INHALATION_SOLUTION | Freq: Once | RESPIRATORY_TRACT | Status: AC
  Administered 2013-03-24: 15 mg via RESPIRATORY_TRACT
  Filled 2013-03-24: qty 20

## 2013-03-24 MED ORDER — DIGOXIN 250 MCG PO TABS: 0.2500 mg | ORAL_TABLET | Freq: Every day | ORAL | Status: DC

## 2013-03-24 MED ORDER — PREDNISONE 20 MG PO TABS
60.0000 mg | ORAL_TABLET | Freq: Once | ORAL | Status: AC
  Administered 2013-03-24: 60 mg via ORAL
  Filled 2013-03-24: qty 3

## 2013-03-24 NOTE — ED Notes (Addendum)
Presents with SOB, asthma exacerbation that began a few days ago associated with productive green sputum cough and inspiratory and expiratory wheezes and fine crackles in left lung fields. Sats on RA 90%. Denies fevers. Using home nebs with no relief.  Pt able to speak in full sentences, no accessory muscle use.

## 2013-03-24 NOTE — ED Notes (Signed)
Pt is in a gown and on the monitor. 

## 2013-03-24 NOTE — ED Provider Notes (Signed)
  Medical screening examination/treatment/procedure(s) were performed by non-physician practitioner and as supervising physician I was immediately available for consultation/collaboration.    Rayyan Orsborn, MD 03/24/13 2008 

## 2013-03-24 NOTE — ED Notes (Signed)
Pt discharged.Vital signs stable and GCS 15.Discharge instruction given. 

## 2013-03-24 NOTE — Progress Notes (Signed)
ED CM noted no PCP,or insurance coverage  listed. CM meet with patient. She reports she has Medicaid but does not have a PCP. Discussed the importance and benefits of having a  PCP. Pt is appreciative and agrees. Instructed patient to look for assigned PCP on medicaid card. Pt reports using [facility].  Pt denies any problems with paying co-pay for prescriptions. No further CM needs identified.

## 2013-03-24 NOTE — ED Provider Notes (Signed)
CSN: [id number]     Arrival date & time 03/24/13  1516 History   First MD Initiated Contact with Patient 03/24/13 1546     Chief Complaint  Patient presents with  . Asthma   (Consider location/radiation/quality/duration/timing/severity/associated sxs/prior Treatment) HPI Comments: Patient with history of asthma, atrial fibrillation on sotalol and digoxin however discontinued 2 months ago when she ran out -- presents with complaint of wheezing and shortness of breath for the past 2 days. Symptoms are worse today and not responsive to albuterol. Patient has had cough productive of sputum. No fever, nausea or vomiting. No history of hospitalizations due to asthma attacks. Course is constant. Nothing makes symptoms better or worse.  Patient is a 27 y.o. female presenting with asthma. The history is provided by the patient.  Asthma Associated symptoms include coughing. Pertinent negatives include no abdominal pain, chest pain, fever, headaches, myalgias, nausea, rash, sore throat or vomiting.    Past Medical History  Diagnosis Date  . Asthma   . Atrial fibrillation     LV function lower limit of normal   . GERD (gastroesophageal reflux disease)   . Depression    Past Surgical History  Procedure Laterality Date  . Heart ablation     Family History  Problem Relation Age of Onset  . Asthma Brother   . Hypertension Mother   . Hypertension Maternal Grandmother    History  Substance Use Topics  . Smoking status: Current Every Day Smoker    Types: Cigarettes  . Smokeless tobacco: Not on file     Comment: since 2002; smokes about 8 cig/day   . Alcohol Use: Yes     Comment: weekends    OB History   Grav Para Term Preterm Abortions TAB SAB Ect Mult Living   5 2  2 3 2 1   2      Review of Systems  Constitutional: Negative for fever.  HENT: Negative for rhinorrhea and sore throat.   Eyes: Negative for redness.  Respiratory: Positive for cough, shortness of breath and wheezing.    Cardiovascular: Positive for palpitations. Negative for chest pain and leg swelling.  Gastrointestinal: Negative for nausea, vomiting, abdominal pain and diarrhea.  Genitourinary: Negative for dysuria.  Musculoskeletal: Negative for myalgias.  Skin: Negative for rash.  Neurological: Negative for headaches.    Allergies  Review of patient's allergies indicates no known allergies.  Home Medications   Current Outpatient Rx  Name  Route  Sig  Dispense  Refill  . albuterol (PROVENTIL HFA;VENTOLIN HFA) 108 (90 BASE) MCG/ACT inhaler   Inhalation   Inhale 1-2 puffs into the lungs every 6 (six) hours as needed for wheezing.   1 Inhaler   0   . digoxin (LANOXIN) 0.25 MG tablet   Oral   Take 1 tablet (0.25 mg total) by mouth daily.   30 tablet   1     Patient needs to call for appointment   . ipratropium-albuterol (DUONEB) 0.5-2.5 (3) MG/3ML SOLN   Nebulization   Take 3 mLs by nebulization every 4 (four) hours as needed (for wheezing and shortness of breath.). Pharmacy may substitute for generic equivalents         . sotalol (BETAPACE) 120 MG tablet   Oral   Take 120 mg by mouth 2 (two) times daily.          BP 103/75  Pulse 82  Temp(Src) 98.2 F (36.8 C) (Oral)  Resp 18  SpO2 92% Physical Exam  Nursing  note and vitals reviewed. Constitutional: She appears well-developed and well-nourished.  HENT:  Head: Normocephalic and atraumatic.  Eyes: Conjunctivae are normal. Right eye exhibits no discharge. Left eye exhibits no discharge.  Neck: Normal range of motion. Neck supple.  Cardiovascular: Normal rate and normal heart sounds.  An irregular rhythm present.  Occasional PVC. No afib confirmed on monitor.   Pulmonary/Chest: Effort normal. No accessory muscle usage. Not tachypneic. No respiratory distress. She has decreased breath sounds. She has wheezes.  Abdominal: Soft. There is no tenderness.  Neurological: She is alert.  Skin: Skin is warm and dry.  Psychiatric: She  has a normal mood and affect.    ED Course  Procedures (including critical care time) Labs Review Labs Reviewed - No data to display Imaging Review No results found.  EKG Interpretation     Ventricular Rate:  69 PR Interval:  120 QRS Duration: 82 QT Interval:  434 QTC Calculation: 465 R Axis:   86 Text Interpretation:  Sinus rhythm with Premature supraventricular complexes Otherwise normal ECG Artifact Borderline ECG           4:16 PM Patient seen and examined. Work-up initiated. Medications ordered.   Vital signs reviewed and are as follows: Filed Vitals:   03/24/13 1557  BP: 103/75  Pulse:   Temp:   Resp: 18    Date: 03/24/2013  Rate: 69  Rhythm: normal sinus rhythm and premature atrial contractions (PAC)  QRS Axis: normal  Intervals: normal  ST/T Wave abnormalities: normal  Conduction Disutrbances:none  Narrative Interpretation:   Old EKG Reviewed: changes noted from 10/20/12, rate faster, new PACs   5:33 PM Pt just started hr neb.   Pt with much improvement, resolution of wheezing, and improved O2 sat after hr neb. She is requesting to go home as she is feeling better.   She requests refill of digoxin and sotalol. I have discussed this with Dr. Jeraldine Loots and have given 14 days of these medications.   Will d/c with prednisone.   Patient urged to return with worsening symptoms or other concerns. Patient verbalized understanding and agrees with plan.     MDM   1. Asthma   2. Medication refill    Pt with asthma, neg CXR, improved in ED after treatments. D/c on prednisone. She has albuterol at home. Antiarrhythmic refills as above. Pt to f/u with her cardiologist.    Renne Crigler, PA-C 03/24/13 1930

## 2013-04-01 ENCOUNTER — Emergency Department (HOSPITAL_COMMUNITY)
Admission: EM | Admit: 2013-04-01 | Discharge: 2013-04-01 | Disposition: A | Discharge status: Home / Self Care | Payer: Medicaid Other | Source: Home / Self Care | Attending: Family Medicine | Admitting: Family Medicine

## 2013-04-01 ENCOUNTER — Encounter (HOSPITAL_COMMUNITY): Payer: Self-pay | Admitting: Emergency Medicine

## 2013-04-01 DIAGNOSIS — L309 Dermatitis, unspecified: Secondary | ICD-10-CM

## 2013-04-01 DIAGNOSIS — S39011A Strain of muscle, fascia and tendon of abdomen, initial encounter: Secondary | ICD-10-CM

## 2013-04-01 DIAGNOSIS — IMO0002 Reserved for concepts with insufficient information to code with codable children: Secondary | ICD-10-CM

## 2013-04-01 DIAGNOSIS — F419 Anxiety disorder, unspecified: Secondary | ICD-10-CM

## 2013-04-01 DIAGNOSIS — L259 Unspecified contact dermatitis, unspecified cause: Secondary | ICD-10-CM

## 2013-04-01 DIAGNOSIS — K5900 Constipation, unspecified: Secondary | ICD-10-CM

## 2013-04-01 DIAGNOSIS — F411 Generalized anxiety disorder: Secondary | ICD-10-CM

## 2013-04-01 LAB — POCT URINALYSIS DIP (DEVICE)
Bilirubin Urine: NEGATIVE
Glucose, UA: NEGATIVE mg/dL
Hgb urine dipstick: NEGATIVE
Ketones, ur: NEGATIVE mg/dL
Specific Gravity, Urine: 1.015 (ref 1.005–1.030)
Urobilinogen, UA: 1 mg/dL (ref 0.0–1.0)
pH: 6.5 (ref 5.0–8.0)

## 2013-04-01 MED ORDER — CLONAZEPAM 0.5 MG PO TABS: 0.5000 mg | ORAL_TABLET | Freq: Two times a day (BID) | ORAL | Status: DC | PRN

## 2013-04-01 MED ORDER — ACYCLOVIR 5 % EX OINT: TOPICAL_OINTMENT | CUTANEOUS | Status: DC

## 2013-04-01 NOTE — ED Provider Notes (Signed)
Medical screening examination/treatment/procedure(s) were performed by non-physician practitioner and as supervising physician I was immediately available for consultation/collaboration.  Leslee Home, M.D.  Reuben Likes, MD 04/01/13 2138

## 2013-04-01 NOTE — ED Provider Notes (Signed)
CSN: [id number]     Arrival date & time 04/01/13  1939 History   First MD Initiated Contact with Patient 04/01/13 1948     Chief Complaint  Patient presents with  . Abdominal Pain   (Consider location/radiation/quality/duration/timing/severity/associated sxs/prior Treatment) HPI Comments: 27 yo female comes in concerned about anxiety increase with constant stress. Advised she was previously controlled on Klonopin but cannot take xanax because it makes her sleepy. She does not have a PCP or Psych doctor to restart her medicines. Denies SI/ HI. She is also concerned after straining from constipation one week ago that her abdomen seems swollen and tender in the middle. She notes the constipation has improved and denies any other GI symptoms. Her 3rd complaint is a concern of repetitive rash on lower back that starts with blisters and pain then itches with redness. Notes rash on/off x 2 years.  Patient is a 27 y.o. female presenting with abdominal pain.  Abdominal Pain Associated symptoms include abdominal pain.    Past Medical History  Diagnosis Date  . Asthma   . Atrial fibrillation     LV function lower limit of normal   . GERD (gastroesophageal reflux disease)   . Depression    Past Surgical History  Procedure Laterality Date  . Heart ablation     Family History  Problem Relation Age of Onset  . Asthma Brother   . Hypertension Mother   . Hypertension Maternal Grandmother    History  Substance Use Topics  . Smoking status: Current Every Day Smoker    Types: Cigarettes  . Smokeless tobacco: Not on file     Comment: since 2002; smokes about 8 cig/day   . Alcohol Use: Yes     Comment: weekends    OB History   Grav Para Term Preterm Abortions TAB SAB Ect Mult Living   5 2  2 3 2 1   2      Review of Systems  Constitutional: Negative.   HENT: Negative.   Respiratory: Negative.   Cardiovascular: Negative.   Gastrointestinal: Positive for abdominal pain and constipation.   Musculoskeletal: Negative.   Skin: Positive for rash.  Neurological: Negative.   Psychiatric/Behavioral: The patient is nervous/anxious.     Allergies  Review of patient's allergies indicates no known allergies.  Home Medications   Current Outpatient Rx  Name  Route  Sig  Dispense  Refill  . albuterol (PROVENTIL HFA;VENTOLIN HFA) 108 (90 BASE) MCG/ACT inhaler   Inhalation   Inhale 1-2 puffs into the lungs every 6 (six) hours as needed for wheezing.   1 Inhaler   0   . digoxin (LANOXIN) 0.25 MG tablet   Oral   Take 1 tablet (0.25 mg total) by mouth daily.   14 tablet   0   . sotalol (BETAPACE) 120 MG tablet   Oral   Take 1 tablet (120 mg total) by mouth 2 (two) times daily.   28 tablet   0   . acyclovir ointment (ZOVIRAX) 5 %   Topical   Apply topically every 3 (three) hours.   5 g   0   . clonazePAM (KLONOPIN) 0.5 MG tablet   Oral   Take 1 tablet (0.5 mg total) by mouth 2 (two) times daily as needed for anxiety.   30 tablet   0   . ipratropium-albuterol (DUONEB) 0.5-2.5 (3) MG/3ML SOLN   Nebulization   Take 3 mLs by nebulization every 4 (four) hours as needed (for wheezing and  shortness of breath.). Pharmacy may substitute for generic equivalents         . predniSONE (DELTASONE) 20 MG tablet   Oral   Take 2 tablets (40 mg total) by mouth daily.   8 tablet   0    BP 120/81  Pulse 81  Temp(Src) 98.2 F (36.8 C) (Oral)  Resp 21  SpO2 95% Physical Exam  Nursing note and vitals reviewed. Constitutional: She appears well-developed and well-nourished.  Eyes: Pupils are equal, round, and reactive to light.  Neck: Normal range of motion.  Cardiovascular: Normal rate, regular rhythm, normal heart sounds and intact distal pulses.   Pulmonary/Chest: Effort normal and breath sounds normal.  Abdominal: Soft. Bowel sounds are normal.  Minimal tenderness mid abdomen. No obvious hernia  Neurological: She is alert.  Skin: Skin is warm.  Right lower back  quarter size erythematous raised scaling lesion.  LLQ 6mm dark brown mildly elevated nevi.    ED Course  Procedures (including critical care time) Labs Review Labs Reviewed  POCT URINALYSIS DIP (DEVICE)  POCT PREGNANCY, URINE   Imaging Review No results found.    MDM   1. Anxiety   2. Abdominal muscle strain, initial encounter   3. Constipation   4. Dermatitis    Needs to establish at PCP and Psych ASAP for medication management Klonopin .5mg  1 BID #30/ 0 refill. Probably muscle strain of abdominal wall, no obvious hernia advised rest and try to increase H2O to avoid constipation and straining. 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 Antiviral cream apply q 3h prn. ER if any symptom increase.   Advised once PCP established needs to have mole removal on LLQ performed with history of change with pigment.   Berenice Primas, PA-C 04/01/13 2110

## 2013-04-01 NOTE — ED Notes (Addendum)
Pt c/o abd pain around naval area onset 4 days Sxs include: pain that increases w/pressure, vomiting, constipation Denies: f/n/d, urinary sxs, gyn sxs Also c/o panic attacks Sxs include: SOB, palpitations Alert w/no signs of acute distress.

## 2013-05-07 ENCOUNTER — Emergency Department (HOSPITAL_COMMUNITY)
Admission: EM | Admit: 2013-05-07 | Discharge: 2013-05-07 | Disposition: A | Discharge status: Home / Self Care | Payer: Medicaid Other | Attending: Emergency Medicine | Admitting: Emergency Medicine

## 2013-05-07 ENCOUNTER — Encounter (HOSPITAL_COMMUNITY): Payer: Self-pay | Admitting: Emergency Medicine

## 2013-05-07 DIAGNOSIS — Z79899 Other long term (current) drug therapy: Secondary | ICD-10-CM | POA: Insufficient documentation

## 2013-05-07 DIAGNOSIS — L259 Unspecified contact dermatitis, unspecified cause: Secondary | ICD-10-CM

## 2013-05-07 DIAGNOSIS — F329 Major depressive disorder, single episode, unspecified: Secondary | ICD-10-CM | POA: Insufficient documentation

## 2013-05-07 DIAGNOSIS — J45909 Unspecified asthma, uncomplicated: Secondary | ICD-10-CM | POA: Insufficient documentation

## 2013-05-07 DIAGNOSIS — F17200 Nicotine dependence, unspecified, uncomplicated: Secondary | ICD-10-CM | POA: Insufficient documentation

## 2013-05-07 DIAGNOSIS — I4891 Unspecified atrial fibrillation: Secondary | ICD-10-CM | POA: Insufficient documentation

## 2013-05-07 DIAGNOSIS — F3289 Other specified depressive episodes: Secondary | ICD-10-CM | POA: Insufficient documentation

## 2013-05-07 DIAGNOSIS — IMO0002 Reserved for concepts with insufficient information to code with codable children: Secondary | ICD-10-CM | POA: Insufficient documentation

## 2013-05-07 DIAGNOSIS — Z8719 Personal history of other diseases of the digestive system: Secondary | ICD-10-CM | POA: Insufficient documentation

## 2013-05-07 MED ORDER — TRIAMCINOLONE ACETONIDE 0.1 % EX CREA: 1.0000 "application " | TOPICAL_CREAM | Freq: Two times a day (BID) | CUTANEOUS | Status: DC

## 2013-05-07 NOTE — ED Notes (Signed)
Mom reports rash to arm.  Unsure if from jewelry or if related to rash daughter has.  NAD

## 2013-05-07 NOTE — ED Provider Notes (Signed)
CSN: [id number]     Arrival date & time 05/07/13  1958 History   First MD Initiated Contact with Patient 05/07/13 2002     Chief Complaint  Patient presents with  . Rash   (Consider location/radiation/quality/duration/timing/severity/associated sxs/prior Treatment) Patient is a 27 y.o. female presenting with rash. The history is provided by the patient and a parent.  Rash Location: b/l forearms. Quality: dryness, itchiness and redness   Severity:  Moderate Onset quality:  Gradual Duration:  1 week Timing:  Constant Progression:  Spreading Chronicity:  New Context comment:  Traveled to [place], daughter wtih similar symptoms Relieved by:  Nothing Worsened by:  Nothing tried Ineffective treatments: benadryl. Associated symptoms: no abdominal pain, no diarrhea, no fever, no headaches, no throat swelling, no tongue swelling, not vomiting and not wheezing     Past Medical History  Diagnosis Date  . Asthma   . Atrial fibrillation     LV function lower limit of normal   . GERD (gastroesophageal reflux disease)   . Depression    Past Surgical History  Procedure Laterality Date  . Heart ablation     Family History  Problem Relation Age of Onset  . Asthma Brother   . Hypertension Mother   . Hypertension Maternal Grandmother    History  Substance Use Topics  . Smoking status: Current Every Day Smoker    Types: Cigarettes  . Smokeless tobacco: Not on file     Comment: since 2002; smokes about 8 cig/day   . Alcohol Use: Yes     Comment: weekends    OB History   Grav Para Term Preterm Abortions TAB SAB Ect Mult Living   5 2  2 3 2 1   2      Review of Systems  Constitutional: Negative for fever.  Respiratory: Negative for wheezing.   Gastrointestinal: Negative for vomiting, abdominal pain and diarrhea.  Skin: Positive for rash.  Neurological: Negative for headaches.  All other systems reviewed and are negative.    Allergies  Review of patient's allergies indicates  no known allergies.  Home Medications   Current Outpatient Rx  Name  Route  Sig  Dispense  Refill  . acyclovir ointment (ZOVIRAX) 5 %   Topical   Apply topically every 3 (three) hours.   5 g   0   . albuterol (PROVENTIL HFA;VENTOLIN HFA) 108 (90 BASE) MCG/ACT inhaler   Inhalation   Inhale 1-2 puffs into the lungs every 6 (six) hours as needed for wheezing.   1 Inhaler   0   . clonazePAM (KLONOPIN) 0.5 MG tablet   Oral   Take 1 tablet (0.5 mg total) by mouth 2 (two) times daily as needed for anxiety.   30 tablet   0   . digoxin (LANOXIN) 0.25 MG tablet   Oral   Take 1 tablet (0.25 mg total) by mouth daily.   14 tablet   0   . ipratropium-albuterol (DUONEB) 0.5-2.5 (3) MG/3ML SOLN   Nebulization   Take 3 mLs by nebulization every 4 (four) hours as needed (for wheezing and shortness of breath.). Pharmacy may substitute for generic equivalents         . predniSONE (DELTASONE) 20 MG tablet   Oral   Take 2 tablets (40 mg total) by mouth daily.   8 tablet   0   . sotalol (BETAPACE) 120 MG tablet   Oral   Take 1 tablet (120 mg total) by mouth 2 (two) times daily.  28 tablet   0   . triamcinolone cream (KENALOG) 0.1 %   Topical   Apply 1 application topically 2 (two) times daily. Apply bid to affected areas X 5 days qs   30 g   0    BP 114/77  Pulse 82  Temp(Src) 98.1 F (36.7 C) (Oral)  Resp 20  Wt 121 lb 4.1 oz (55 kg)  SpO2 97% Physical Exam  Nursing note and vitals reviewed. Constitutional: She is oriented to person, place, and time. She appears well-developed and well-nourished.  HENT:  Head: Normocephalic.  Right Ear: External ear normal.  Left Ear: External ear normal.  Nose: Nose normal.  Mouth/Throat: Oropharynx is clear and moist.  Eyes: EOM are normal. Pupils are equal, round, and reactive to light. Right eye exhibits no discharge. Left eye exhibits no discharge.  Neck: Normal range of motion. Neck supple. No tracheal deviation present.  No  nuchal rigidity no meningeal signs  Cardiovascular: Normal rate and regular rhythm.   Pulmonary/Chest: Effort normal and breath sounds normal. No stridor. No respiratory distress. She has no wheezes. She has no rales.  Abdominal: Soft. She exhibits no distension and no mass. There is no tenderness. There is no rebound and no guarding.  Musculoskeletal: Normal range of motion. She exhibits no edema and no tenderness.  Neurological: She is alert and oriented to person, place, and time. She has normal reflexes. No cranial nerve deficit. Coordination normal.  Skin: Skin is warm. Rash noted. She is not diaphoretic. No erythema. No pallor.  No pettechia no purpura  raised erythematous macules located over bilateral forearms. No induration no fluctuance no tenderness    ED Course  Procedures (including critical care time) Labs Review Labs Reviewed - No data to display Imaging Review No results found.  EKG Interpretation   None       MDM   1. Contact dermatitis    Patient with what appears to be contact dermatitis noted on exam. Will prescribe triamcinolone cream and Benadryl. No petechiae no purpura noted. Patient is nontoxic and well-appearing on exam.    Arley Phenix, MD 05/07/13 2242

## 2013-06-05 IMAGING — CR DG CHEST 2V
2 series · 2 of 2 positions shown · non-contrast
Comparison: 08/21/2011

CLINICAL DATA: SOB.  Palpitations

CHEST - 2 VIEW

[w chest pa]
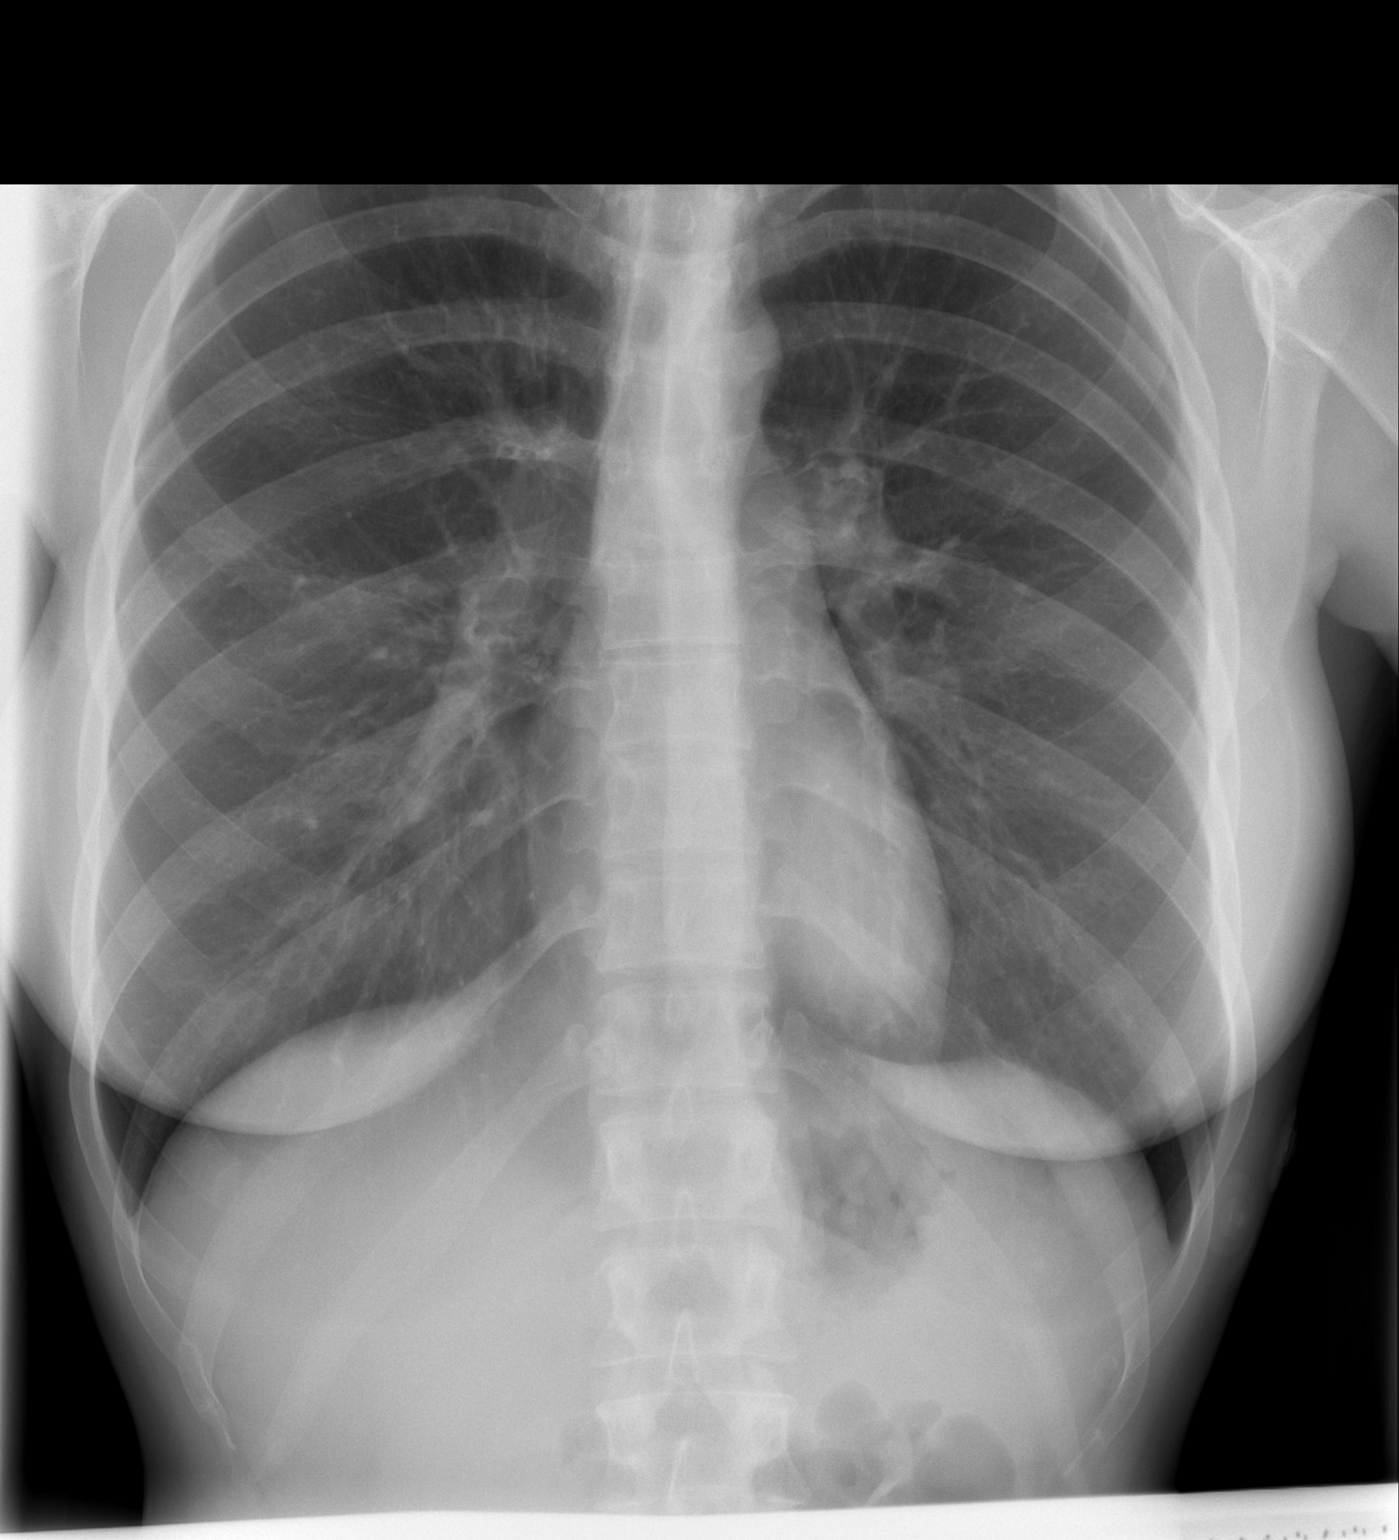

[w chest lat]
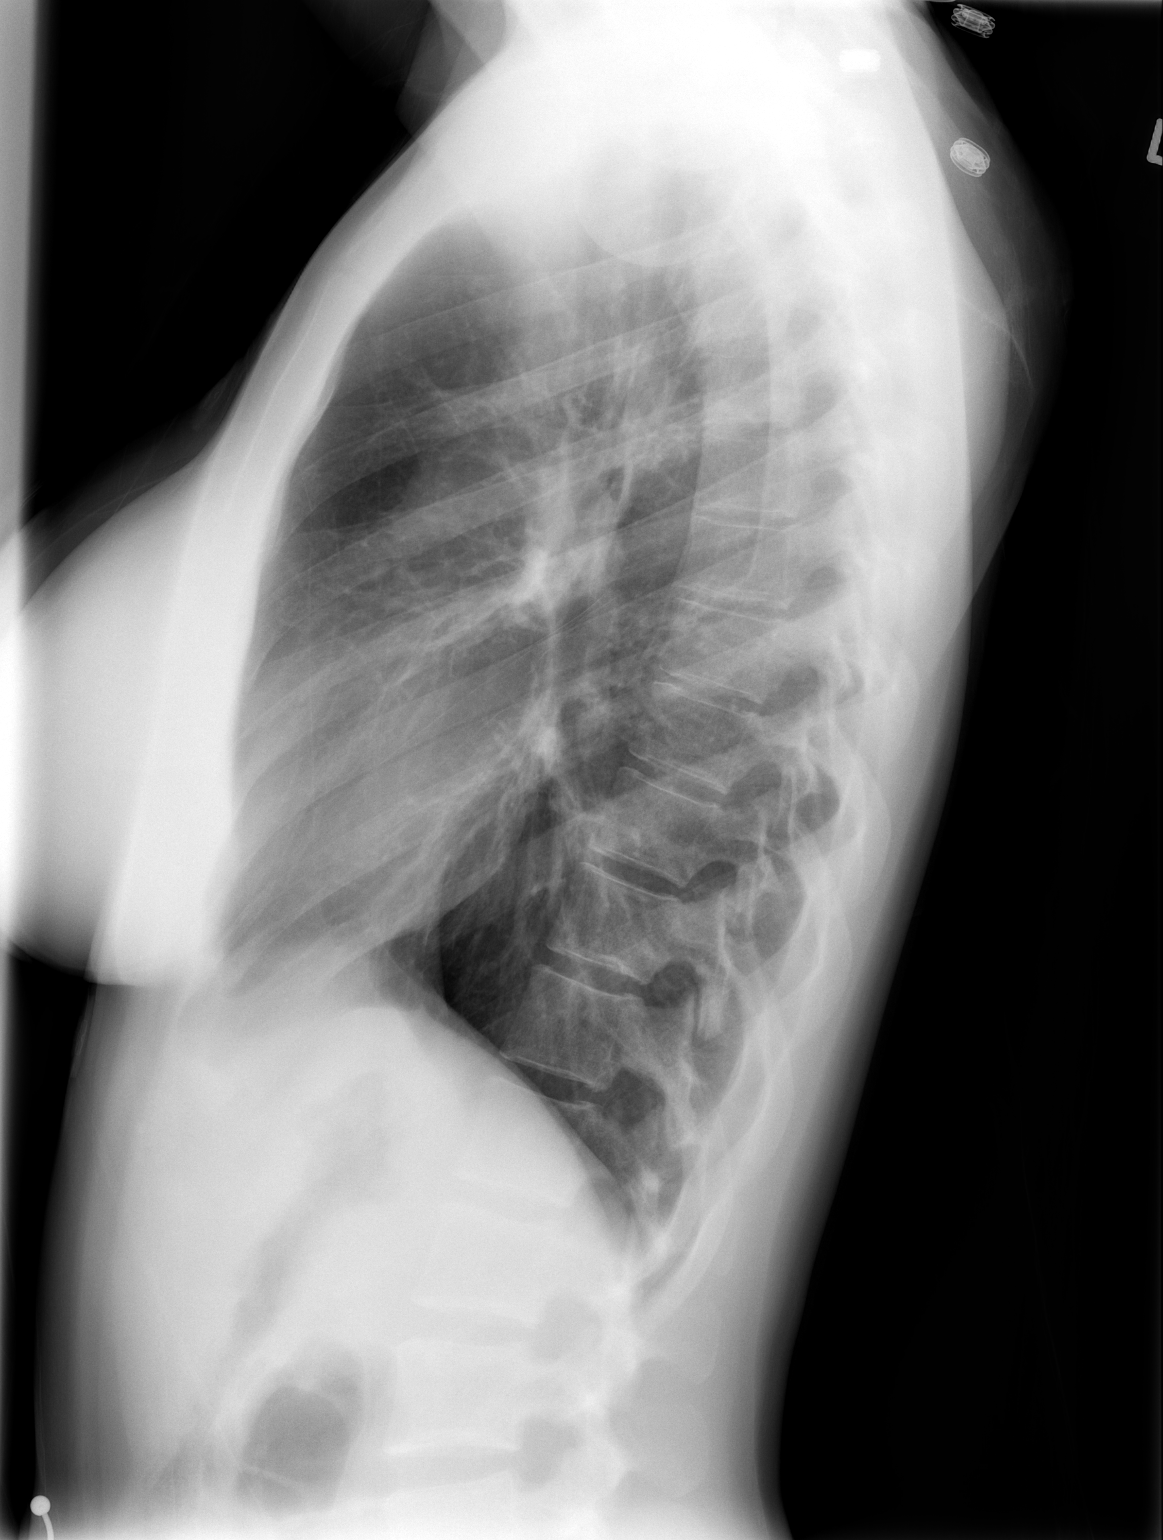

[2 of 2 positions shown; findings below may reference images not displayed]

FINDINGS: The heart size and mediastinal contours are within normal
limits.  Both lungs are clear.  The visualized skeletal structures
are unremarkable.
IMPRESSION: Negative exam.

## 2013-09-25 ENCOUNTER — Other Ambulatory Visit: Payer: Self-pay | Admitting: Internal Medicine

## 2013-09-25 NOTE — Telephone Encounter (Signed)
Ok to deny this?

## 2013-09-25 NOTE — Telephone Encounter (Signed)
I cannot answer whether to fill/deny.  We must deny from our end, because [name] has never seen this patient in office. So  I am unsure who has been monitoring this medication, but it has not been this office.

## 2013-12-11 ENCOUNTER — Encounter (HOSPITAL_COMMUNITY): Payer: Self-pay | Admitting: Emergency Medicine

## 2013-12-11 ENCOUNTER — Emergency Department (HOSPITAL_COMMUNITY): Payer: Medicaid Other

## 2013-12-11 ENCOUNTER — Inpatient Hospital Stay (HOSPITAL_COMMUNITY)
Admission: EM | Admit: 2013-12-11 | Discharge: 2013-12-12 | DRG: 310 | Discharge status: Against Medical Advice | Payer: Medicaid Other | Attending: Cardiovascular Disease | Admitting: Cardiovascular Disease

## 2013-12-11 DIAGNOSIS — F17200 Nicotine dependence, unspecified, uncomplicated: Secondary | ICD-10-CM | POA: Diagnosis present

## 2013-12-11 DIAGNOSIS — J45909 Unspecified asthma, uncomplicated: Secondary | ICD-10-CM | POA: Diagnosis present

## 2013-12-11 DIAGNOSIS — F3289 Other specified depressive episodes: Secondary | ICD-10-CM | POA: Diagnosis present

## 2013-12-11 DIAGNOSIS — Z5320 Procedure and treatment not carried out because of patient's decision for unspecified reasons: Secondary | ICD-10-CM

## 2013-12-11 DIAGNOSIS — F1110 Opioid abuse, uncomplicated: Secondary | ICD-10-CM | POA: Diagnosis present

## 2013-12-11 DIAGNOSIS — F1010 Alcohol abuse, uncomplicated: Secondary | ICD-10-CM | POA: Diagnosis present

## 2013-12-11 DIAGNOSIS — Z79899 Other long term (current) drug therapy: Secondary | ICD-10-CM

## 2013-12-11 DIAGNOSIS — I4891 Unspecified atrial fibrillation: Secondary | ICD-10-CM

## 2013-12-11 DIAGNOSIS — F1120 Opioid dependence, uncomplicated: Secondary | ICD-10-CM

## 2013-12-11 DIAGNOSIS — F329 Major depressive disorder, single episode, unspecified: Secondary | ICD-10-CM | POA: Diagnosis present

## 2013-12-11 DIAGNOSIS — I959 Hypotension, unspecified: Secondary | ICD-10-CM | POA: Diagnosis present

## 2013-12-11 DIAGNOSIS — K219 Gastro-esophageal reflux disease without esophagitis: Secondary | ICD-10-CM | POA: Diagnosis present

## 2013-12-11 DIAGNOSIS — Z9119 Patient's noncompliance with other medical treatment and regimen: Secondary | ICD-10-CM

## 2013-12-11 DIAGNOSIS — F1920 Other psychoactive substance dependence, uncomplicated: Secondary | ICD-10-CM

## 2013-12-11 DIAGNOSIS — Z5329 Procedure and treatment not carried out because of patient's decision for other reasons: Secondary | ICD-10-CM

## 2013-12-11 DIAGNOSIS — Z91199 Patient's noncompliance with other medical treatment and regimen due to unspecified reason: Secondary | ICD-10-CM

## 2013-12-11 HISTORY — DX: Opioid dependence, uncomplicated: F11.20

## 2013-12-11 HISTORY — DX: Procedure and treatment not carried out because of patient's decision for unspecified reasons: Z53.20

## 2013-12-11 HISTORY — DX: Other psychoactive substance dependence, uncomplicated: F19.20

## 2013-12-11 LAB — RAPID URINE DRUG SCREEN, HOSP PERFORMED
Amphetamines: NOT DETECTED
Barbiturates: NOT DETECTED
Benzodiazepines: NOT DETECTED
COCAINE: NOT DETECTED
OPIATES: POSITIVE — AB
Tetrahydrocannabinol: NOT DETECTED

## 2013-12-11 LAB — BASIC METABOLIC PANEL
ANION GAP: 16 — AB (ref 5–15)
BUN: 6 mg/dL (ref 6–23)
CALCIUM: 9.6 mg/dL (ref 8.4–10.5)
CHLORIDE: 97 meq/L (ref 96–112)
CO2: 25 mEq/L (ref 19–32)
Creatinine, Ser: 0.65 mg/dL (ref 0.50–1.10)
Glucose, Bld: 125 mg/dL — ABNORMAL HIGH (ref 70–99)
Potassium: 4.2 mEq/L (ref 3.7–5.3)
Sodium: 138 mEq/L (ref 137–147)

## 2013-12-11 LAB — CBC
HCT: 47.1 % — ABNORMAL HIGH (ref 36.0–46.0)
Hemoglobin: 16 g/dL — ABNORMAL HIGH (ref 12.0–15.0)
MCH: 30 pg (ref 26.0–34.0)
MCHC: 34 g/dL (ref 30.0–36.0)
MCV: 88.4 fL (ref 78.0–100.0)
PLATELETS: 239 10*3/uL (ref 150–400)
RBC: 5.33 MIL/uL — ABNORMAL HIGH (ref 3.87–5.11)
RDW: 14.6 % (ref 11.5–15.5)
WBC: 11.2 10*3/uL — ABNORMAL HIGH (ref 4.0–10.5)

## 2013-12-11 LAB — TROPONIN I: Troponin I: <0.3 ng/mL (ref <0.30)

## 2013-12-11 LAB — MAGNESIUM: MAGNESIUM: 1.7 mg/dL (ref 1.5–2.5)

## 2013-12-11 LAB — I-STAT TROPONIN, ED: TROPONIN I, POC: 0 ng/mL (ref 0.00–0.08)

## 2013-12-11 LAB — MRSA PCR SCREENING: MRSA by PCR: NEGATIVE

## 2013-12-11 LAB — DIGOXIN LEVEL

## 2013-12-11 LAB — TSH: TSH: 1.02 u[IU]/mL (ref 0.350–4.500)

## 2013-12-11 MED ORDER — SODIUM CHLORIDE 0.9 % IV BOLUS (SEPSIS)
1000.0000 mL | Freq: Once | INTRAVENOUS | Status: AC
  Administered 2013-12-11: 1000 mL via INTRAVENOUS

## 2013-12-11 MED ORDER — ACETAMINOPHEN 325 MG PO TABS: 650.0000 mg | ORAL_TABLET | ORAL | Status: DC | PRN

## 2013-12-11 MED ORDER — ALBUTEROL SULFATE (2.5 MG/3ML) 0.083% IN NEBU: 3.0000 mL | INHALATION_SOLUTION | Freq: Four times a day (QID) | RESPIRATORY_TRACT | Status: DC | PRN

## 2013-12-11 MED ORDER — ONDANSETRON HCL 4 MG/2ML IJ SOLN: 4.0000 mg | Freq: Four times a day (QID) | INTRAMUSCULAR | Status: DC | PRN

## 2013-12-11 MED ORDER — DILTIAZEM LOAD VIA INFUSION
10.0000 mg | Freq: Once | INTRAVENOUS | Status: AC
  Administered 2013-12-11: 10 mg via INTRAVENOUS
  Filled 2013-12-11: qty 10

## 2013-12-11 MED ORDER — DIGOXIN 0.25 MG/ML IJ SOLN
0.5000 mg | Freq: Once | INTRAMUSCULAR | Status: AC
  Administered 2013-12-11: 0.5 mg via INTRAVENOUS
  Filled 2013-12-11: qty 2

## 2013-12-11 MED ORDER — DILTIAZEM HCL 100 MG IV SOLR
5.0000 mg/h | INTRAVENOUS | Status: DC
  Administered 2013-12-11: 10 mg/h via INTRAVENOUS

## 2013-12-11 MED ORDER — APIXABAN 5 MG PO TABS
5.0000 mg | ORAL_TABLET | Freq: Two times a day (BID) | ORAL | Status: DC
  Administered 2013-12-12: 5 mg via ORAL
  Filled 2013-12-11 (×4): qty 1

## 2013-12-11 NOTE — H&P (Signed)
Chief Complaint: Palpitations/ Near syncope Primary Cardiologist: Dr. Caryl Comes   HPI: The patient is a 28 year old female with a prior history of atrial fibrillation, status post ablation roughly 6 years ago at [facility]. She has also been seen in the past by Dr. Caryl Comes, but has not followed up since 2012. She presented to [facility] ER today with a complaint of intermittent episodes of weakness, dizziness, shortness of breath, palpitations, diaphoresis and near syncope. She denies frank syncope. No chest pain. Her symptoms first started 2 days ago and became more severe and prolonged today. On arrival to the ER, she was noted to be in atrial fibrillation with a rapid ventricular response in the 170s.  She was placed on IV Cardizem. Her heart rate has improved some but remains elevated in the 120s to 130s.   She is currently on 10 mg/hr, however further titration has been limited due to borderline hypotension. Her most recent blood pressures have been running in the low 62V systolic.  She tells me that she ran out of her sotalol and digoxin 2-3 months ago. She states that she could no longer afford the medications. She denies any recent excessive caffeine intake. She does admit to recent intake of excessive amounts of alcohol. Last night, she had at least 3 shots of Patron tequila. She is a chronic smoker, smoking on average a half a pack per day. She denies any other drug use and no other use of stimulants. She denies any recent viral illnesses. No fevers, chills, cough, nausea or vomiting.    Past Medical History  Diagnosis Date  . Asthma   . Atrial fibrillation     LV function lower limit of normal   . GERD (gastroesophageal reflux disease)   . Depression     Past Surgical History  Procedure Laterality Date  . Heart ablation      Family History  Problem Relation Age of Onset  . Asthma Brother   . Hypertension Mother   . Hypertension Maternal Grandmother    Social  History:  reports that she has been smoking Cigarettes.  She has been smoking about 0.00 packs per day. She does not have any smokeless tobacco history on file. She reports that she drinks alcohol. She reports that she uses illicit drugs (Marijuana).  Allergies: No Known Allergies   (Not in a hospital admission)  Results for orders placed during the hospital encounter of 12/11/13 (from the past 48 hour(s))  BASIC METABOLIC PANEL     Status: Abnormal   Collection Time    12/11/13  2:20 PM      Result Value Ref Range   Sodium 138  137 - 147 mEq/L   Potassium 4.2  3.7 - 5.3 mEq/L   Chloride 97  96 - 112 mEq/L   CO2 25  19 - 32 mEq/L   Glucose, Bld 125 (*) 70 - 99 mg/dL   BUN 6  6 - 23 mg/dL   Creatinine, Ser 0.65  0.50 - 1.10 mg/dL   Calcium 9.6  8.4 - 10.5 mg/dL   GFR calc non Af Amer >90  >90 mL/min   GFR calc Af Amer >90  >90 mL/min   Comment: (NOTE)     The eGFR has been calculated using the CKD EPI equation.     This calculation has not been validated in all clinical situations.     eGFR's persistently <90 mL/min signify possible Chronic Kidney  Disease.   Anion gap 16 (*) 5 - 15  CBC     Status: Abnormal   Collection Time    12/11/13  2:20 PM      Result Value Ref Range   WBC 11.2 (*) 4.0 - 10.5 K/uL   RBC 5.33 (*) 3.87 - 5.11 MIL/uL   Hemoglobin 16.0 (*) 12.0 - 15.0 g/dL   HCT 47.1 (*) 36.0 - 46.0 %   MCV 88.4  78.0 - 100.0 fL   MCH 30.0  26.0 - 34.0 pg   MCHC 34.0  30.0 - 36.0 g/dL   RDW 14.6  11.5 - 15.5 %   Platelets 239  150 - 400 K/uL  DIGOXIN LEVEL     Status: Abnormal   Collection Time    12/11/13  2:20 PM      Result Value Ref Range   Digoxin Level <0.3 (*) 0.8 - 2.0 ng/mL  TROPONIN I     Status: None   Collection Time    12/11/13  2:20 PM      Result Value Ref Range   Troponin I <0.30  <0.30 ng/mL   Comment:            Due to the release kinetics of cTnI,     a negative result within the first hours     of the onset of symptoms does not rule out       myocardial infarction with certainty.     If myocardial infarction is still suspected,     repeat the test at appropriate intervals.  Randolm Idol, ED     Status: None   Collection Time    12/11/13  2:30 PM      Result Value Ref Range   Troponin i, poc 0.00  0.00 - 0.08 ng/mL   Comment 3            Comment: Due to the release kinetics of cTnI,     a negative result within the first hours     of the onset of symptoms does not rule out     myocardial infarction with certainty.     If myocardial infarction is still suspected,     repeat the test at appropriate intervals.   Dg Chest Port 1 View  12/11/2013   CLINICAL DATA:  Shortness breath. Tachycardia. Chest pain. Productive cough.  EXAM: PORTABLE CHEST - 1 VIEW  COMPARISON:  Two-view chest 03/24/2013.  FINDINGS: The heart size is normal. The lungs are clear. The visualized soft tissues and bony thorax are unremarkable.  IMPRESSION: Negative one-view chest.   Electronically Signed   By: Lawrence Santiago M.D.   On: 12/11/2013 14:52    Review of Systems  Constitutional: Positive for diaphoresis.  Respiratory: Positive for shortness of breath.   Cardiovascular: Positive for palpitations. Negative for chest pain and leg swelling.  Gastrointestinal: Negative for nausea and vomiting.  Neurological: Positive for dizziness and weakness. Negative for loss of consciousness.  Psychiatric/Behavioral: Positive for substance abuse.  All other systems reviewed and are negative.   Blood pressure 92/66, pulse 67, temperature 98.1 F (36.7 C), temperature source Oral, resp. rate 15, SpO2 93.00%. Physical Exam  Constitutional: She is oriented to person, place, and time. She appears well-developed and well-nourished. No distress.  Cardiovascular: An irregularly irregular rhythm present. Tachycardia present.  Exam reveals no gallop and no friction rub.   No murmur heard. Pulses:      Radial pulses are 2+ on the right  side, and 2+ on the left  side.       Dorsalis pedis pulses are 2+ on the right side, and 2+ on the left side.  Respiratory: Effort normal and breath sounds normal. No respiratory distress. She has no wheezes. She has no rales.  Musculoskeletal: She exhibits no edema.  Neurological: She is alert and oriented to person, place, and time.  Skin: Skin is warm and dry. She is not diaphoretic.  Psychiatric: She has a normal mood and affect. Her behavior is normal.     Assessment/Plan Principal Problem:   Atrial fibrillation with RVR   28 year old female with prior history of atrial fibrillation, status post ablation procedure at [facility] roughly 6 years ago, who presents to the ER with complaints of palpitations, shortness of breath and near-syncope. She was found to be in atrial fibrillation with a rapid ventricular response, in the setting of medication noncompliance, likely exacerbated by recent excessive amounts of alcohol consumption.   1. Atrial Fibrillation with RVR: She has had only minimal improvement with the initiation of IV Cardizem. Her heart rate remains elevated in the 120s to 130s. She is on 10 mg/hour of Cardizem and further titration is limited by borderline hypotension. ER MD has ordered a dose of IV digoxin. She may require direct current cardioversion to restore normal sinus rhythm if rate cannot be adequately controlled medically or if she becomes unstable. Potassium is WNL. Recommend checking Mg in the setting of alcohol use. Also check a TSH. Will need to continue to stress importance of medication compliance and abstinence from excessive alcohol consumption.   SIMMONS, [place] 12/11/2013, 4:40 PM   I have seen and examined the patient along with Lyda Jester, PA NP.  I have reviewed the chart, notes and new data.  I agree with PA's note.  Key new complaints: feels better with improved rate control Key examination changes: SBP borderline in the 90s, but she is tolerating it  well Key new findings / data: labs are normal  PLAN: Chronic recurrent paroxysmal AF with RVR with remote unsuccessful RFA, acute episode triggered by medication noncompliance and alcohol use. Admit for rate control and re-initiation of antiarrhythmic medications. Unfortunately, 48h have elapsed since symptom onset, early cardioversion may not be an option. Sotalol may not be the best choice if she does not follow up closely - will ask for an EP opinion regarding antiarrhythmic choices, cardioversion timing and consideration of redo RF ablation. Start Eliquis -  Not long term, but pre and post cardioversion (CHADsVASC score is 0).  Sanda Klein, MD, [place] [phone] 12/11/2013, 5:10 PM

## 2013-12-11 NOTE — Consult Note (Signed)
Primary Care Physician: No PCP Per Patient Referring Physician:  Dr Royann Shivers   Benetta Spar [name] is a 28 y.o. female with a h/o ongoing polysubstance abuse, noncompliance, asthma, and atrial fibrillation who is admitted with symptomatic atrial fibrillation.  She has carried a diagnosis of atrial fibrillation for about 10 years.  She says that she was initially treated with flecainide but is not certain as to why it was discontinued.  She also says that she underwent catheter ablation (not clear if this was for afib or SVT) by Dr Rudolpho Sevin in [facility] more than 5 years ago.  I do not have records of this.  I do have records from cardiology in [place] (in epic under media) which suggests that her afib goes back to at least 2010.  She has seen Dr Graciela Husbands previously but not for several years.  He had been treating her with sotalol.   She says that she quite taking her sotalol 2-3 months ago.  Over the past few days, she has had increased social stress due to conflicts with the father of one of her children.  She has been drinking heavy ETOH and chronically uses narcotics.  She thinks that heavy ETOH may have triggered her afib.  She reports symptoms of tachypalpitations with dizziness.  She feels that her afib has been "off and on" for 2-3 days.  She has tried taking narcotics (which she refers to as "Roxi") to try to ease her afib without improvement.  She has had a heroin overdose 5/14.   Today, she denies symptoms of chest pain, shortness of breath, orthopnea, PND, lower extremity edema, syncope, or neurologic sequela. The patient is tolerating medications without difficulties and is otherwise without complaint today.   Past Medical History  Diagnosis Date  . Asthma   . Atrial fibrillation     LV function lower limit of normal   . GERD (gastroesophageal reflux disease)   . Depression   . Polysubstance dependence including opioid type drug, continuous use     prior heroin overdose   Past  Surgical History  Procedure Laterality Date  . Catheter ablation      she reports prior ablatio (More than 5 years ago) by Dr Rudolpho Sevin in [facility]    Current Facility-Administered Medications  Medication Dose Route Frequency Provider Last Rate Last Dose  . acetaminophen (TYLENOL) tablet 650 mg  650 mg Oral Q4H PRN Brittainy Simmons, PA-C      . albuterol (PROVENTIL) (2.5 MG/3ML) 0.083% nebulizer solution 3 mL  3 mL Inhalation Q6H PRN Brittainy Simmons, PA-C      . apixaban (ELIQUIS) tablet 5 mg  5 mg Oral BID Brittainy Simmons, PA-C      . diltiazem (CARDIZEM) 100 mg in dextrose 5 % 100 mL infusion  5-15 mg/hr Intravenous Continuous Gilda Crease, MD 10 mL/hr at 12/11/13 1446 10 mg/hr at 12/11/13 1446  . ondansetron (ZOFRAN) injection 4 mg  4 mg Intravenous Q6H PRN Brittainy Simmons, PA-C        No Known Allergies  History   Social History  . Marital Status: Single    Spouse Name: N/A    Number of Children: N/A  . Years of Education: N/A   Occupational History  . Not on file.   Social History Main Topics  . Smoking status: Current Every Day Smoker    Types: Cigarettes  . Smokeless tobacco: Not on file     Comment: since 2002; smokes about 8 cig/day   .  Alcohol Use: Yes     Comment: heavy and frequent  . Drug Use: Yes    Special: Marijuana     Comment: heroin  . Sexual Activity: Yes    Birth Control/ Protection: Condom   Other Topics Concern  . Not on file   Social History Narrative   Single; children; Insurance underwritermodel/web design.    [place] in [place] 2 young daughters    Family History  Problem Relation Age of Onset  . Asthma Brother   . Hypertension Mother   . Hypertension Maternal Grandmother     ROS- All systems are reviewed and negative except as per the HPI above  Physical Exam: Filed Vitals:   12/11/13 1800 12/11/13 1815 12/11/13 1915 12/11/13 1945  BP: 100/71 105/69 103/64 108/65  Pulse: 87 109 90 99  Temp:      TempSrc:      Resp: 12 24 17  15   SpO2: 97% 94% 96% 97%    GEN- The patient is thin and disheveled appearing, alert and oriented x 3 today.   Head- normocephalic, atraumatic Eyes-  Sclera clear, conjunctiva pink Ears- hearing intact Oropharynx- clear Neck- supple, no JVP Lymph- no cervical lymphadenopathy Lungs- Clear to ausculation bilaterally, normal work of breathing Heart- tachycardic irregular rhythm, no murmurs, rubs or gallops, PMI not laterally displaced GI- soft, NT, ND, + BS Extremities- no clubbing, cyanosis, or edema MS- no significant deformity or atrophy Skin- many many tattoos and piercing  Psych- euthymic mood, full affect Neuro- strength and sensation are intact  EKG afib, V rate 150 bpm, nonspecific ST/T changes Epic records including echo from 2009, Dr Koren BoundKleins notes, Dr Croitoru's admit note, and prior [place] are reviewed at length  Assessment and Plan:  1. Atrial fibrillation The patient has recurrent symptomatic atrial fibrillation.  Her atrial fibrillation is likely due to her destructive lifestyle which includes ongoing opiate and heavy ETOH use.  She has been noncompliant with medical therapy and has not seen Dr Graciela HusbandsKlein in several years.  She appears to have had an ablation by Dr Rudolpho [facility]vinAkbary at [facility] more than 5 years ago.  We will need to eventually request these records to see if she had SVT, atrial flutter, afib, etc ablation performed. Presently, I would agree with anticoagulation in order to potentially restore sinus rhythm. She has been initiated on eliquis by Dr Royann Shiversroitoru.  I would recommend cardizem drip to initially achieve rate control.  This can be transitioned to oral cardizem over the weekend by the general cardiology team.  I am concerned that she is a poor candidate for antiarrhythmic medicines at this time given her difficulty with compliance.  I will defer this decision to Dr Graciela HusbandsKlein (can be made in the outpatient setting if she converts to sinus  rhythm over the weekend).  She is not a candidate for ablation at this time.  I think that the most necessary step in the treatment of her afib is that she completely quite abuse of drugs and ETOH and demonstrate compliance with outpatient management. If she remains in afib then TEE guided cardioversion would be reasonable on Monday.  If she converts to sinus over the weekend then she could be discharged with long acting diltiazem to follow-up with Dr Graciela HusbandsKlein. I will order an echo to evaluate for structural heart changes related to her afib and polysubstance abuse.  2. Polysubstance abuse I had a long discussion with the patient about the importance of cessation of  drugs/ ETOH.  She has a very destructive lifestyle and her prognosis is very poor without radical lifestyle change.  I also expressed my concerns to her about the impact of her lifestyle/ social instability on her two small daughters with whom she says that she lives alone. I will ask social work to explore options of addiction management with the patient.  I am concerned for her children's safety and will therefore also consult Social work to assist with evaluation of their safety. We will need to monitor for opiate withdrawal while in the hospital.  If she remains in afib and is not discharged tomorrow, it may be wise to consider internal medicine consultation for prevention of withdrawal.  General cardiology to manage with rate control over the weekend.  EP to see as needed

## 2013-12-11 NOTE — ED Notes (Signed)
[place], MD at bedside 

## 2013-12-11 NOTE — ED Provider Notes (Signed)
CSN: [id number]     Arrival date & time 12/11/13  1338 History   First MD Initiated Contact with Patient 12/11/13 1427     Chief Complaint  Patient presents with  . Chest Pain  . Tachycardia     (Consider location/radiation/quality/duration/timing/severity/associated sxs/prior Treatment) HPI Comments: Patient presents to the ER for evaluation of weakness, dizziness, shortness of breath and palpitations. She has discomfort in her chest. Patient reports a history of atrial fibrillation, has not been taking her sotalol and digoxin as prescribed over the last 2- 3 months.  Patient is a 28 y.o. female presenting with chest pain.  Chest Pain Associated symptoms: palpitations and shortness of breath     Past Medical History  Diagnosis Date  . Asthma   . Atrial fibrillation     LV function lower limit of normal   . GERD (gastroesophageal reflux disease)   . Depression    Past Surgical History  Procedure Laterality Date  . Heart ablation     Family History  Problem Relation Age of Onset  . Asthma Brother   . Hypertension Mother   . Hypertension Maternal Grandmother    History  Substance Use Topics  . Smoking status: Current Every Day Smoker    Types: Cigarettes  . Smokeless tobacco: Not on file     Comment: since 2002; smokes about 8 cig/day   . Alcohol Use: Yes     Comment: weekends    OB History   Grav Para Term Preterm Abortions TAB SAB Ect Mult Living   5 2  2 3 2 1   2      Review of Systems  Respiratory: Positive for shortness of breath.   Cardiovascular: Positive for chest pain and palpitations.  All other systems reviewed and are negative.     Allergies  Review of patient's allergies indicates no known allergies.  Home Medications   Prior to Admission medications   Medication Sig Start Date End Date Taking? Authorizing Provider  albuterol (PROVENTIL HFA;VENTOLIN HFA) 108 (90 BASE) MCG/ACT inhaler Inhale 1-2 puffs into the lungs every 6 (six) hours as  needed for wheezing. 09/10/12  Yes Candyce Churn III, MD  clonazePAM (KLONOPIN) 0.5 MG tablet Take 1 tablet (0.5 mg total) by mouth 2 (two) times daily as needed for anxiety. 04/01/13  Yes Melissa R Smith, PA-C  ipratropium-albuterol (DUONEB) 0.5-2.5 (3) MG/3ML SOLN Take 3 mLs by nebulization every 4 (four) hours as needed (for wheezing and shortness of breath.). Pharmacy may substitute for generic equivalents 04/29/12  Yes Clanford L Johnson, MD   BP 103/71  Pulse 134  Temp(Src) 98.1 F (36.7 C) (Oral)  Resp 17  SpO2 97% Physical Exam  Constitutional: She is oriented to person, place, and time. She appears well-developed and well-nourished. No distress.  HENT:  Head: Normocephalic and atraumatic.  Right Ear: Hearing normal.  Left Ear: Hearing normal.  Nose: Nose normal.  Mouth/Throat: Oropharynx is clear and moist and mucous membranes are normal.  Eyes: Conjunctivae and EOM are normal. Pupils are equal, round, and reactive to light.  Neck: Normal range of motion. Neck supple.  Cardiovascular: S1 normal and S2 normal.  An irregularly irregular rhythm present. Tachycardia present.  Exam reveals no gallop and no friction rub.   No murmur heard. Pulmonary/Chest: Effort normal and breath sounds normal. No respiratory distress. She exhibits no tenderness.  Abdominal: Soft. Normal appearance and bowel sounds are normal. There is no hepatosplenomegaly. There is no tenderness. There is no rebound,  no guarding, no tenderness at McBurney's point and negative Murphy's sign. No hernia.  Musculoskeletal: Normal range of motion.  Neurological: She is alert and oriented to person, place, and time. She has normal strength. No cranial nerve deficit or sensory deficit. Coordination normal. GCS eye subscore is 4. GCS verbal subscore is 5. GCS motor subscore is 6.  Skin: Skin is warm, dry and intact. No rash noted. No cyanosis.  Psychiatric: She has a normal mood and affect. Her speech is normal and  behavior is normal. Thought content normal.    ED Course  Procedures (including critical care time) Labs Review Labs Reviewed  BASIC METABOLIC PANEL - Abnormal; Notable for the following:    Glucose, Bld 125 (*)    Anion gap 16 (*)    All other components within normal limits  CBC - Abnormal; Notable for the following:    WBC 11.2 (*)    RBC 5.33 (*)    Hemoglobin 16.0 (*)    HCT 47.1 (*)    All other components within normal limits  DIGOXIN LEVEL - Abnormal; Notable for the following:    Digoxin Level <0.3 (*)    All other components within normal limits  TROPONIN I  Rosezena SensorI-STAT TROPOININ, ED    Imaging Review Dg Chest Port 1 View  12/11/2013   CLINICAL DATA:  Shortness breath. Tachycardia. Chest pain. Productive cough.  EXAM: PORTABLE CHEST - 1 VIEW  COMPARISON:  Two-view chest 03/24/2013.  FINDINGS: The heart size is normal. The lungs are clear. The visualized soft tissues and bony thorax are unremarkable.  IMPRESSION: Negative one-view chest.   Electronically Signed   By: Gennette Pachris  Mattern M.D.   On: 12/11/2013 14:52     EKG Interpretation   Date/Time:  Friday December 11 2013 14:00:05 EDT Ventricular Rate:  177 PR Interval:    QRS Duration: 68 QT Interval:  264 QTC Calculation: 453 R Axis:   89 Text Interpretation:  Atrial fibrillation with rapid ventricular response  with premature ventricular or aberrantly conducted complexes Abnormal ECG  Confirmed by Shawonda Kerce  MD, Shaun Runyon [phone]([id number]) on 12/11/2013 2:27:16 PM      MDM   Final diagnoses:  None   atrial fibrillation with rapid ventricular response  Presents to the ER for evaluation of palpitations, chest pain, feeling like she's going to pass out. She is found to be in atrial fibrillation with rapid ventricular response, as high as 177. Patient has been off of her digoxin and sotalol. She was administered IV Cardizem by bolus and drip. Smaller doses were chosen because she did have some mild hypotension upon arrival. She  was also given fluid bolus. Patient has tolerated the medication but has only had partial response. We will consult cardiology for further management.    Gilda Creasehristopher J. Lamekia Nolden, MD 12/11/13 [phone]

## 2013-12-11 NOTE — ED Notes (Addendum)
Pt expressing interest in opioid detox. States she uses 3 roxys at a time, using about twice a day.

## 2013-12-11 NOTE — ED Notes (Signed)
Pt alert.  She has not taken her digoxin in 3-4 months at home.

## 2013-12-11 NOTE — ED Notes (Signed)
Pt states has had history of afib since 28yo and has had ablation.  Pt has been off Sotalol and Digoxin for 2-3 months.  Now here in Afib with RVR 170s and feels like she is going to pass out.  Pt reports chest pain

## 2013-12-12 DIAGNOSIS — Z5329 Procedure and treatment not carried out because of patient's decision for other reasons: Secondary | ICD-10-CM

## 2013-12-12 HISTORY — DX: Procedure and treatment not carried out because of patient's decision for other reasons: Z53.29

## 2013-12-12 LAB — CBC
HEMATOCRIT: 41.3 % (ref 36.0–46.0)
HEMOGLOBIN: 13.6 g/dL (ref 12.0–15.0)
MCH: 29.4 pg (ref 26.0–34.0)
MCHC: 32.9 g/dL (ref 30.0–36.0)
MCV: 89.2 fL (ref 78.0–100.0)
Platelets: 233 10*3/uL (ref 150–400)
RBC: 4.63 MIL/uL (ref 3.87–5.11)
RDW: 14.8 % (ref 11.5–15.5)
WBC: 8.9 10*3/uL (ref 4.0–10.5)

## 2013-12-12 LAB — BASIC METABOLIC PANEL
Anion gap: 14 (ref 5–15)
BUN: 5 mg/dL — AB (ref 6–23)
CHLORIDE: 102 meq/L (ref 96–112)
CO2: 23 meq/L (ref 19–32)
CREATININE: 0.6 mg/dL (ref 0.50–1.10)
Calcium: 8.8 mg/dL (ref 8.4–10.5)
GFR calc Af Amer: >90 mL/min (ref >90)
GFR calc non Af Amer: >90 mL/min (ref >90)
GLUCOSE: 118 mg/dL — AB (ref 70–99)
POTASSIUM: 4.2 meq/L (ref 3.7–5.3)
Sodium: 139 mEq/L (ref 137–147)

## 2013-12-12 MED ORDER — DILTIAZEM HCL ER COATED BEADS 120 MG PO CP24: 120.0000 mg | ORAL_CAPSULE | Freq: Every day | ORAL | Status: DC

## 2013-12-12 NOTE — Progress Notes (Signed)
Entered pt's room this morning and pt states that she wants to leave d/t her "detoxing real bad".  Pt requesting that MD be contacted for afib scripts so she can "get out of here".  When asked what she's taken in the past, pt responds opiates, when asked what specific opiates, she states, "roxi" when asked if she takes anything else, pt states "heroin".  When asked when she last took anything, pt states, "heroin yesterday at around 1pm".  When asked how pt uses heroin, pt responds, "snort, shoot, smoke, however I can get it".  Pt reports taking xanax from home overnight at approximately midnight because she reports she was "freaking out".    PA on-call paged to follow up with pt's script request prior to her leaving.  Educated pt of the importance of taking her medications and seeking assistance with her substance abuse.  Pt currently in SR.  Pt has opted to leave AMA, pleasant and appreciative prior to leaving - signed documentation.  ELink, CCMD and cardiology notified.  Pt escorted to ED exit - no pain or discomfort upon leaving.

## 2013-12-12 NOTE — Progress Notes (Signed)
Called by RN because pt had converted to SR, but was saying she was going to leave AMA.   Currently VSS and Cardizem gtt is at 5 mg/hr.   Will send in Rx for Cardizem CD 120 mg daily. Asked RN to try to get her to stay till MD sees, but she is not sure pt will do so.  Theodore Demarkhonda Barrett, PA-C 12/12/2013 8:12 AM [place] [phone]

## 2013-12-14 ENCOUNTER — Encounter (HOSPITAL_COMMUNITY): Payer: Self-pay | Admitting: Cardiology

## 2013-12-14 DIAGNOSIS — Z5320 Procedure and treatment not carried out because of patient's decision for unspecified reasons: Secondary | ICD-10-CM

## 2013-12-14 DIAGNOSIS — Z5329 Procedure and treatment not carried out because of patient's decision for other reasons: Secondary | ICD-10-CM

## 2013-12-14 NOTE — Discharge Summary (Signed)
Physician Discharge Summary       Patient ID: [name] MRN: [id number] DOB/AGE: 1986/01/22 28 y.o.  Admit date: 12/11/2013 Discharge date: 12/14/2013  Discharge Diagnoses:  Principal Problem:   Atrial fibrillation with RVR Active Problems:   Atrial fibrillation with rapid ventricular response   Left against medical advice   Discharged Condition: good  Pt left AMA  Procedures: none  Hospital Course: 28 year old female with a prior history of atrial fibrillation, status post ablation roughly 6 years ago at [facility]. She has also been seen in the past by Dr. Graciela Husbands, but has not followed up since 2012. She presented to [facility] ER 12/11/13 with a complaint of intermittent episodes of weakness, dizziness, shortness of breath, palpitations, diaphoresis and near syncope. She denies frank syncope. No chest pain. Her symptoms first started 2 days ago and became more severe and prolonged today. On arrival to the ER, she was noted to be in atrial fibrillation with a rapid ventricular response in the 170s. She was placed on IV Cardizem. Her heart rate had improved some but remains elevated in the 120s to 130s. She was on 10 mg/hr when seen in ER, however further titration was limited due to borderline hypotension. Her most recent blood pressures had been running in the low 90s systolic.   She related that she ran out of her sotalol and digoxin 2-3 months ago. She stated that she could no longer afford the medications. She denied any recent excessive caffeine intake. She did admit to recent intake of excessive amounts of alcohol. Night prior to admit, she had at least 3 shots of Patron tequila. She is a chronic smoker, smoking on average a half a pack per day. She denied any other drug use and no other use of stimulants. She denied any recent viral illnesses. No fevers, chills, cough, nausea or vomiting. We admitted for rate control, and anticoagulation with Eliquis  started.   Dr. Johney Frame with EP saw her and to him she admitted she has tried taking narcotics (which she refers to as "Roxi") to try to ease her afib without improvement. She has had a heroin overdose 5/14.  She had been using Heroin the day of admit also.  By the next AM she told RN she was "detoxing real bad".  She also had in hospital taken Xanax from home because she was "freking out" per the RN's note.   PA on call sent in RX for cardizem CD 120 and asked pt to stay until MD could see, but the pt left.   Dr. Johney Frame noted "Her atrial fibrillation is likely due to her destructive lifestyle which includes ongoing opiate and heavy ETOH use."  Dr. Johney Frame talked with her at length on importance of cessation of drugs and ETOH. Consult to social worker was ordered.  Unfortunately pt. Did not stay for any assistance.  Last EKG prior to leaving against medical advice remained in A fib.   (Plan had been to control rate and then if not converted on her own she would have undergone TEE DCCV)   Consults: cardiology- EP with Dr. Johney Frame.  Significant Diagnostic Studies:  BMET    Component Value Date/Time   NA 139 12/12/2013 0220   K 4.2 12/12/2013 0220   CL 102 12/12/2013 0220   CO2 23 12/12/2013 0220   GLUCOSE 118* 12/12/2013 0220   BUN 5* 12/12/2013 0220   CREATININE 0.60 12/12/2013 0220   CALCIUM 8.8 12/12/2013 0220   GFRNONAA >  90 12/12/2013 0220   GFRAA >90 12/12/2013 0220    CBC    Component Value Date/Time   WBC 8.9 12/12/2013 0220   RBC 4.63 12/12/2013 0220   HGB 13.6 12/12/2013 0220   HCT 41.3 12/12/2013 0220   PLT 233 12/12/2013 0220   MCV 89.2 12/12/2013 0220   MCH 29.4 12/12/2013 0220   MCHC 32.9 12/12/2013 0220   RDW 14.8 12/12/2013 0220   LYMPHSABS 2.6 10/21/2012 0525   MONOABS 1.3* 10/21/2012 0525   EOSABS 0.0 10/21/2012 0525   BASOSABS 0.1 10/21/2012 0525   Troponion negative for MI  TSH 1.020 Drug screen + for opiates    PORTABLE CHEST - 1 VIEW  COMPARISON: Two-view chest 03/24/2013.    FINDINGS:  The heart size is normal. The lungs are clear. The visualized soft  tissues and bony thorax are unremarkable.  IMPRESSION:  Negative one-view chest.   Discharge Exam: Blood pressure 114/94, pulse 70, temperature 97.7 F (36.5 C), temperature source Oral, resp. rate 18, height 5\' 7"  (1.702 m), weight 117 lb 8.1 oz (53.3 kg), SpO2 99.00%.   Disposition: 07-Left Against Medical Advice     Medication List    TAKE these medications       diltiazem 120 MG 24 hr capsule  Commonly known as:  CARDIZEM CD  Take 1 capsule (120 mg total) by mouth daily.      ASK your doctor about these medications       albuterol 108 (90 BASE) MCG/ACT inhaler  Commonly known as:  PROVENTIL HFA;VENTOLIN HFA  Inhale 1-2 puffs into the lungs every 6 (six) hours as needed for wheezing.     clonazePAM 0.5 MG tablet  Commonly known as:  KLONOPIN  Take 1 tablet (0.5 mg total) by mouth 2 (two) times daily as needed for anxiety.     ipratropium-albuterol 0.5-2.5 (3) MG/3ML Soln  Commonly known as:  DUONEB  Take 3 mLs by nebulization every 4 (four) hours as needed (for wheezing and shortness of breath.). Pharmacy may substitute for generic equivalents       Only the cardizem was sent to pharmacy due to pt leaving AMA.   Discharge Instructions:none, pt left AMA- did not wait for any instructions.  Our office will call to see if she would like follow up or if she feels unwell will recommend the ER.  Signed: Leone BrandINGOLD,LAURA R Nurse Practitioner-Certified [facility]: [facility] 12/14/2013, 3:22 PM  Time spent on discharge : <30 minutes.

## 2013-12-15 ENCOUNTER — Encounter: Payer: Self-pay | Admitting: Cardiovascular Disease

## 2013-12-23 ENCOUNTER — Encounter (HOSPITAL_COMMUNITY): Payer: Self-pay | Admitting: Emergency Medicine

## 2013-12-23 ENCOUNTER — Emergency Department (HOSPITAL_COMMUNITY): Payer: Self-pay

## 2013-12-23 ENCOUNTER — Emergency Department (HOSPITAL_COMMUNITY)
Admission: EM | Admit: 2013-12-23 | Discharge: 2013-12-24 | Disposition: A | Discharge status: Home / Self Care | Payer: Self-pay | Attending: Emergency Medicine | Admitting: Emergency Medicine

## 2013-12-23 DIAGNOSIS — F1990 Other psychoactive substance use, unspecified, uncomplicated: Secondary | ICD-10-CM

## 2013-12-23 DIAGNOSIS — F1110 Opioid abuse, uncomplicated: Secondary | ICD-10-CM | POA: Insufficient documentation

## 2013-12-23 DIAGNOSIS — I480 Paroxysmal atrial fibrillation: Secondary | ICD-10-CM

## 2013-12-23 DIAGNOSIS — I4891 Unspecified atrial fibrillation: Secondary | ICD-10-CM | POA: Insufficient documentation

## 2013-12-23 DIAGNOSIS — J45909 Unspecified asthma, uncomplicated: Secondary | ICD-10-CM | POA: Insufficient documentation

## 2013-12-23 DIAGNOSIS — F1910 Other psychoactive substance abuse, uncomplicated: Secondary | ICD-10-CM | POA: Insufficient documentation

## 2013-12-23 DIAGNOSIS — F1920 Other psychoactive substance dependence, uncomplicated: Secondary | ICD-10-CM

## 2013-12-23 DIAGNOSIS — F3289 Other specified depressive episodes: Secondary | ICD-10-CM | POA: Insufficient documentation

## 2013-12-23 DIAGNOSIS — F1120 Opioid dependence, uncomplicated: Secondary | ICD-10-CM | POA: Insufficient documentation

## 2013-12-23 DIAGNOSIS — F17200 Nicotine dependence, unspecified, uncomplicated: Secondary | ICD-10-CM | POA: Insufficient documentation

## 2013-12-23 DIAGNOSIS — Z79899 Other long term (current) drug therapy: Secondary | ICD-10-CM | POA: Insufficient documentation

## 2013-12-23 DIAGNOSIS — F329 Major depressive disorder, single episode, unspecified: Secondary | ICD-10-CM | POA: Insufficient documentation

## 2013-12-23 DIAGNOSIS — K219 Gastro-esophageal reflux disease without esophagitis: Secondary | ICD-10-CM | POA: Insufficient documentation

## 2013-12-23 LAB — BASIC METABOLIC PANEL
Anion gap: 14 (ref 5–15)
BUN: 6 mg/dL (ref 6–23)
CO2: 26 mEq/L (ref 19–32)
Calcium: 9.5 mg/dL (ref 8.4–10.5)
Chloride: 97 mEq/L (ref 96–112)
Creatinine, Ser: 0.55 mg/dL (ref 0.50–1.10)
GFR calc non Af Amer: >90 mL/min (ref >90)
GLUCOSE: 83 mg/dL (ref 70–99)
POTASSIUM: 3.7 meq/L (ref 3.7–5.3)
Sodium: 137 mEq/L (ref 137–147)

## 2013-12-23 LAB — CBC
HEMATOCRIT: 46.4 % — AB (ref 36.0–46.0)
HEMOGLOBIN: 15.6 g/dL — AB (ref 12.0–15.0)
MCH: 29.5 pg (ref 26.0–34.0)
MCHC: 33.6 g/dL (ref 30.0–36.0)
MCV: 87.9 fL (ref 78.0–100.0)
Platelets: 242 10*3/uL (ref 150–400)
RBC: 5.28 MIL/uL — ABNORMAL HIGH (ref 3.87–5.11)
RDW: 14.3 % (ref 11.5–15.5)
WBC: 9.5 10*3/uL (ref 4.0–10.5)

## 2013-12-23 LAB — TROPONIN I: Troponin I: <0.3 ng/mL (ref <0.30)

## 2013-12-23 MED ORDER — SODIUM CHLORIDE 0.9 % IV BOLUS (SEPSIS)
1000.0000 mL | Freq: Once | INTRAVENOUS | Status: AC
  Administered 2013-12-23: 1000 mL via INTRAVENOUS

## 2013-12-23 NOTE — ED Notes (Signed)
Pt reporting 6/10 chest pain, describes as pressure.

## 2013-12-23 NOTE — ED Provider Notes (Signed)
CSN: [id number]     Arrival date & time 12/23/13  2234 History   First MD Initiated Contact with Patient 12/23/13 2303     Chief Complaint  Patient presents with  . Atrial Fibrillation     (Consider location/radiation/quality/duration/timing/severity/associated sxs/prior Treatment) HPI  This patient is a 28 year old woman with a history of polysubstance abuse as well as a history of paroxysmal atrial fibrillation. She was recently hospitalized at left AGAINST MEDICAL ADVICE. She has been followed by Dr. Johney FrameAllred. She is prescribed diltiazem 120 mg tablets which she reports compliance with.  She presents with 2 days of intermittent feeling of fluttering and palpitations in the chest consistent with previous episodes of atrial fibrillation. The patient uses heroin on a daily basis-she shoots and snorts. She says that she accidentally used IV crystal meth yesterday. She thinks that this may have contributed to experience of recurrent atrial fibrillation. She denies any sensation of chest pain, shortness of breath or lightheadedness tonight.  Past Medical History  Diagnosis Date  . Asthma   . Atrial fibrillation     LV function lower limit of normal   . GERD (gastroesophageal reflux disease)   . Depression   . Polysubstance dependence including opioid type drug, continuous use     prior heroin overdose  . Left against medical advice 12/12/13   Past Surgical History  Procedure Laterality Date  . Catheter ablation      she reports prior ablatio (More than 5 years ago) by [facility]   Family History  Problem Relation Age of Onset  . Asthma Brother   . Hypertension Mother   . Hypertension Maternal Grandmother    History  Substance Use Topics  . Smoking status: Current Every Day Smoker    Types: Cigarettes  . Smokeless tobacco: Not on file     Comment: since 2002; smokes about 8 cig/day   . Alcohol Use: Yes     Comment: heavy and frequent   OB History   Grav Para Term  Preterm Abortions TAB SAB Ect Mult Living   5 2  2 3 2 1   2      Review of Systems  Ten point review of symptoms performed and is negative with the exception of symptoms noted above.   Allergies  Review of patient's allergies indicates no known allergies.  Home Medications   Prior to Admission medications   Medication Sig Start Date End Date Taking? Authorizing Provider  albuterol (PROVENTIL HFA;VENTOLIN HFA) 108 (90 BASE) MCG/ACT inhaler Inhale 1-2 puffs into the lungs every 6 (six) hours as needed for wheezing. 09/10/12   Candyce ChurnJohn David Wofford III, MD  clonazePAM (KLONOPIN) 0.5 MG tablet Take 1 tablet (0.5 mg total) by mouth 2 (two) times daily as needed for anxiety. 04/01/13   Melissa R Smith, PA-C  diltiazem (CARDIZEM CD) 120 MG 24 hr capsule Take 1 capsule (120 mg total) by mouth daily. 12/12/13 12/12/14  Rhonda G Barrett, PA-C  ipratropium-albuterol (DUONEB) 0.5-2.5 (3) MG/3ML SOLN Take 3 mLs by nebulization every 4 (four) hours as needed (for wheezing and shortness of breath.). Pharmacy may substitute for generic equivalents 04/29/12   Clanford L Laural BenesJohnson, MD   BP 109/66  Temp(Src) 98.8 F (37.1 C) (Oral)  Resp 12  SpO2 100% Physical Exam Gen: well developed and well nourished appearing Head: NCAT Eyes: PERL, EOMI Nose: no epistaixis or rhinorrhea Mouth/throat: mucosa is moist and pink Neck: supple, no stridor Lungs: CTA B, no wheezing, rhonchi or  rales CV: regular rate and rythm, good distal pulses.  Abd: soft, notender, nondistended Back: no ttp, no cva ttp Skin: warm and dry the skin is heavily tattooed Ext: no edema, normal to inspection Neuro: CN ii-xii grossly intact, no focal deficits Psyche; normal affect,  calm and cooperative.  ED Course  Procedures (including critical care time) L Results for orders placed during the hospital encounter of 12/23/13 (from the past 24 hour(s))  CBC     Status: Abnormal   Collection Time    12/23/13 10:51 PM      Result Value Ref  Range   WBC 9.5  4.0 - 10.5 K/uL   RBC 5.28 (*) 3.87 - 5.11 MIL/uL   Hemoglobin 15.6 (*) 12.0 - 15.0 g/dL   HCT 04.5 (*) 40.9 - 81.1 %   MCV 87.9  78.0 - 100.0 fL   MCH 29.5  26.0 - 34.0 pg   MCHC 33.6  30.0 - 36.0 g/dL   RDW 91.4  78.2 - 95.6 %   Platelets 242  150 - 400 K/uL  BASIC METABOLIC PANEL     Status: None   Collection Time    12/23/13 10:51 PM      Result Value Ref Range   Sodium 137  137 - 147 mEq/L   Potassium 3.7  3.7 - 5.3 mEq/L   Chloride 97  96 - 112 mEq/L   CO2 26  19 - 32 mEq/L   Glucose, Bld 83  70 - 99 mg/dL   BUN 6  6 - 23 mg/dL   Creatinine, Ser 2.13  0.50 - 1.10 mg/dL   Calcium 9.5  8.4 - 08.6 mg/dL   GFR calc non Af Amer >90  >90 mL/min   GFR calc Af Amer >90  >90 mL/min   Anion gap 14  5 - 15  TROPONIN I     Status: None   Collection Time    12/23/13 10:51 PM      Result Value Ref Range   Troponin I <0.30  <0.30 ng/mL  PREGNANCY, URINE     Status: None   Collection Time    12/24/13  2:09 AM      Result Value Ref Range   Preg Test, Ur NEGATIVE  NEGATIVE  URINE RAPID DRUG SCREEN (HOSP PERFORMED)     Status: Abnormal   Collection Time    12/24/13  2:09 AM      Result Value Ref Range   Opiates POSITIVE (*) NONE DETECTED   Cocaine POSITIVE (*) NONE DETECTED   Benzodiazepines POSITIVE (*) NONE DETECTED   Amphetamines POSITIVE (*) NONE DETECTED   Tetrahydrocannabinol POSITIVE (*) NONE DETECTED   Barbiturates NONE DETECTED  NONE DETECTED   EKG: atrial fibrillation with normal rate, no acute ischemic changes, normal intervals, normal axis, normal qrs complexImaging Review  EKG #2: NSR  MDM   Patient presented with atrial fibrillation with rate control. She spontaneously converted to sinus rhythm with IV fluids. She can report that her symptoms began less than 48 hours ago. No doubt, most likely secondary to methamphetamine use. I discussed the case with cardiologist on call who agrees that the patient is stable for discharge. I counseled the  patient at length about the importance of abstaining from recreational drug use. Particularly from stimulants such as cocaine and methamphetamine. She is not a candidate for anticoagulation at this time, according to cardiology. She is asked to followup with Dr. Johney Frame.   Brandt Loosen, MD 12/24/13 [phone]

## 2013-12-23 NOTE — ED Notes (Signed)
Per EMS, pt has hx of a-fib since she was 28 yo. Seeing a new cardiologist and is undergoing med changes. Heart rate 150-180 en route, now 103. Was given 20 Cardizem en route.

## 2013-12-24 LAB — RAPID URINE DRUG SCREEN, HOSP PERFORMED
AMPHETAMINES: POSITIVE — AB
BENZODIAZEPINES: POSITIVE — AB
Barbiturates: NOT DETECTED
Cocaine: POSITIVE — AB
Opiates: POSITIVE — AB
Tetrahydrocannabinol: POSITIVE — AB

## 2013-12-24 LAB — PREGNANCY, URINE: PREG TEST UR: NEGATIVE

## 2013-12-24 NOTE — ED Notes (Addendum)
Attempting to call pt to insure IV was removed prior to leaving the emergency department. No answer. Unable to reach pt or emergency contact. [employer] notified and given pt information.

## 2013-12-24 NOTE — ED Notes (Addendum)
Pt came to desk asking for discharge papers, explained her mother was angry and wanted her to leave now. Asked pt to stay for a few more minutes to receive papers, she stated she would not wait. Ambulated toward exit with steady gait, no distress noted.

## 2013-12-24 NOTE — ED Notes (Signed)
Pt has been in bathroom for over 30 mins. Staff has repeatedly checked on her. She states she still has not urinated.

## 2013-12-24 NOTE — Discharge Instructions (Signed)

## 2014-01-01 ENCOUNTER — Ambulatory Visit: Payer: Medicaid Other | Admitting: Cardiology

## 2014-01-22 ENCOUNTER — Ambulatory Visit: Payer: Self-pay | Admitting: Cardiology

## 2014-01-26 ENCOUNTER — Emergency Department (HOSPITAL_COMMUNITY): Payer: Self-pay

## 2014-01-26 ENCOUNTER — Emergency Department (HOSPITAL_COMMUNITY)
Admission: EM | Admit: 2014-01-26 | Discharge: 2014-01-26 | Discharge status: Against Medical Advice | Payer: Self-pay | Attending: Emergency Medicine | Admitting: Emergency Medicine

## 2014-01-26 ENCOUNTER — Encounter (HOSPITAL_COMMUNITY): Payer: Self-pay | Admitting: Emergency Medicine

## 2014-01-26 DIAGNOSIS — R079 Chest pain, unspecified: Secondary | ICD-10-CM | POA: Insufficient documentation

## 2014-01-26 DIAGNOSIS — F17200 Nicotine dependence, unspecified, uncomplicated: Secondary | ICD-10-CM | POA: Insufficient documentation

## 2014-01-26 DIAGNOSIS — J45909 Unspecified asthma, uncomplicated: Secondary | ICD-10-CM | POA: Insufficient documentation

## 2014-01-26 NOTE — ED Notes (Addendum)
Cp that staRTED SINCE YESTERDAY INCREASED STRESS anxiety due to  Warrant being served, hx of afib out of cardizem  X 1 day has iv rt hand 20 , [place] cards  was her dr but did not f/u so she is new again. No n/v some sob  Denies drug use at this time

## 2014-02-02 ENCOUNTER — Emergency Department (HOSPITAL_COMMUNITY)
Admission: EM | Admit: 2014-02-02 | Discharge: 2014-02-02 | Discharge status: Against Medical Advice | Payer: Self-pay | Attending: Emergency Medicine | Admitting: Emergency Medicine

## 2014-02-02 ENCOUNTER — Encounter (HOSPITAL_COMMUNITY): Payer: Self-pay | Admitting: Emergency Medicine

## 2014-02-02 DIAGNOSIS — J45909 Unspecified asthma, uncomplicated: Secondary | ICD-10-CM | POA: Insufficient documentation

## 2014-02-02 DIAGNOSIS — F411 Generalized anxiety disorder: Secondary | ICD-10-CM | POA: Insufficient documentation

## 2014-02-02 DIAGNOSIS — F17200 Nicotine dependence, unspecified, uncomplicated: Secondary | ICD-10-CM | POA: Insufficient documentation

## 2014-02-02 DIAGNOSIS — R000 Tachycardia, unspecified: Secondary | ICD-10-CM | POA: Insufficient documentation

## 2014-02-02 HISTORY — DX: Bipolar disorder, unspecified: F31.9

## 2014-02-02 LAB — BASIC METABOLIC PANEL
Anion gap: 13 (ref 5–15)
BUN: 7 mg/dL (ref 6–23)
CO2: 26 mEq/L (ref 19–32)
Calcium: 9.3 mg/dL (ref 8.4–10.5)
Chloride: 100 mEq/L (ref 96–112)
Creatinine, Ser: 0.66 mg/dL (ref 0.50–1.10)
GFR calc Af Amer: >90 mL/min (ref >90)
GFR calc non Af Amer: >90 mL/min (ref >90)
GLUCOSE: 84 mg/dL (ref 70–99)
POTASSIUM: 4.3 meq/L (ref 3.7–5.3)
Sodium: 139 mEq/L (ref 137–147)

## 2014-02-02 LAB — CBC
HCT: 45.7 % (ref 36.0–46.0)
HEMOGLOBIN: 15.9 g/dL — AB (ref 12.0–15.0)
MCH: 30.2 pg (ref 26.0–34.0)
MCHC: 34.8 g/dL (ref 30.0–36.0)
MCV: 86.7 fL (ref 78.0–100.0)
Platelets: 197 10*3/uL (ref 150–400)
RBC: 5.27 MIL/uL — AB (ref 3.87–5.11)
RDW: 14 % (ref 11.5–15.5)
WBC: 7.9 10*3/uL (ref 4.0–10.5)

## 2014-02-02 LAB — I-STAT TROPONIN, ED: TROPONIN I, POC: 0 ng/mL (ref 0.00–0.08)

## 2014-02-02 NOTE — ED Notes (Signed)
Per EMS, they were called out after domestic call from [facility] department with patient medical history.   Patient was having fast heart rate, anxiety and "I was going in and out of afib".  Per EMS, patient HR at 170 and patient states she took 2 Cardizem when she felt her heart race.

## 2014-02-08 ENCOUNTER — Encounter (HOSPITAL_COMMUNITY): Payer: Self-pay | Admitting: Emergency Medicine

## 2014-02-08 ENCOUNTER — Emergency Department (HOSPITAL_COMMUNITY)
Admission: EM | Admit: 2014-02-08 | Discharge: 2014-02-08 | Disposition: A | Discharge status: Home / Self Care | Payer: Self-pay | Attending: Emergency Medicine | Admitting: Emergency Medicine

## 2014-02-08 DIAGNOSIS — R0789 Other chest pain: Secondary | ICD-10-CM | POA: Insufficient documentation

## 2014-02-08 DIAGNOSIS — F17200 Nicotine dependence, unspecified, uncomplicated: Secondary | ICD-10-CM | POA: Insufficient documentation

## 2014-02-08 DIAGNOSIS — N12 Tubulo-interstitial nephritis, not specified as acute or chronic: Secondary | ICD-10-CM | POA: Insufficient documentation

## 2014-02-08 DIAGNOSIS — Z8719 Personal history of other diseases of the digestive system: Secondary | ICD-10-CM | POA: Insufficient documentation

## 2014-02-08 DIAGNOSIS — R112 Nausea with vomiting, unspecified: Secondary | ICD-10-CM | POA: Insufficient documentation

## 2014-02-08 DIAGNOSIS — R51 Headache: Secondary | ICD-10-CM | POA: Insufficient documentation

## 2014-02-08 DIAGNOSIS — R079 Chest pain, unspecified: Secondary | ICD-10-CM | POA: Insufficient documentation

## 2014-02-08 DIAGNOSIS — IMO0002 Reserved for concepts with insufficient information to code with codable children: Secondary | ICD-10-CM | POA: Insufficient documentation

## 2014-02-08 DIAGNOSIS — J45909 Unspecified asthma, uncomplicated: Secondary | ICD-10-CM | POA: Insufficient documentation

## 2014-02-08 DIAGNOSIS — Z8679 Personal history of other diseases of the circulatory system: Secondary | ICD-10-CM | POA: Insufficient documentation

## 2014-02-08 DIAGNOSIS — Z3202 Encounter for pregnancy test, result negative: Secondary | ICD-10-CM | POA: Insufficient documentation

## 2014-02-08 DIAGNOSIS — Z79899 Other long term (current) drug therapy: Secondary | ICD-10-CM | POA: Insufficient documentation

## 2014-02-08 DIAGNOSIS — F319 Bipolar disorder, unspecified: Secondary | ICD-10-CM | POA: Insufficient documentation

## 2014-02-08 DIAGNOSIS — Z792 Long term (current) use of antibiotics: Secondary | ICD-10-CM | POA: Insufficient documentation

## 2014-02-08 LAB — URINALYSIS, ROUTINE W REFLEX MICROSCOPIC
BILIRUBIN URINE: NEGATIVE
Glucose, UA: NEGATIVE mg/dL
Hgb urine dipstick: NEGATIVE
KETONES UR: 15 mg/dL — AB
Leukocytes, UA: NEGATIVE
NITRITE: POSITIVE — AB
PROTEIN: NEGATIVE mg/dL
Specific Gravity, Urine: 1.014 (ref 1.005–1.030)
UROBILINOGEN UA: 1 mg/dL (ref 0.0–1.0)
pH: 6 (ref 5.0–8.0)

## 2014-02-08 LAB — BASIC METABOLIC PANEL
ANION GAP: 14 (ref 5–15)
BUN: 7 mg/dL (ref 6–23)
CO2: 25 mEq/L (ref 19–32)
Calcium: 9 mg/dL (ref 8.4–10.5)
Chloride: 97 mEq/L (ref 96–112)
Creatinine, Ser: 0.68 mg/dL (ref 0.50–1.10)
Glucose, Bld: 85 mg/dL (ref 70–99)
POTASSIUM: 4.2 meq/L (ref 3.7–5.3)
SODIUM: 136 meq/L — AB (ref 137–147)

## 2014-02-08 LAB — CBC
HCT: 39.9 % (ref 36.0–46.0)
HEMOGLOBIN: 14 g/dL (ref 12.0–15.0)
MCH: 30 pg (ref 26.0–34.0)
MCHC: 35.1 g/dL (ref 30.0–36.0)
MCV: 85.6 fL (ref 78.0–100.0)
Platelets: 216 10*3/uL (ref 150–400)
RBC: 4.66 MIL/uL (ref 3.87–5.11)
RDW: 13.8 % (ref 11.5–15.5)
WBC: 17.2 10*3/uL — ABNORMAL HIGH (ref 4.0–10.5)

## 2014-02-08 LAB — URINE MICROSCOPIC-ADD ON

## 2014-02-08 LAB — I-STAT CG4 LACTIC ACID, ED: Lactic Acid, Venous: 1.89 mmol/L (ref 0.5–2.2)

## 2014-02-08 LAB — I-STAT TROPONIN, ED: TROPONIN I, POC: 0 ng/mL (ref 0.00–0.08)

## 2014-02-08 LAB — PREGNANCY, URINE: Preg Test, Ur: NEGATIVE

## 2014-02-08 MED ORDER — METOCLOPRAMIDE HCL 5 MG/ML IJ SOLN
10.0000 mg | Freq: Once | INTRAMUSCULAR | Status: AC
Start: 2014-02-08 — End: 2014-02-08
  Administered 2014-02-08: 10 mg via INTRAVENOUS
  Filled 2014-02-08: qty 2

## 2014-02-08 MED ORDER — KETOROLAC TROMETHAMINE 30 MG/ML IJ SOLN
30.0000 mg | Freq: Once | INTRAMUSCULAR | Status: AC
Start: 2014-02-08 — End: 2014-02-08
  Administered 2014-02-08: 30 mg via INTRAVENOUS
  Filled 2014-02-08: qty 1

## 2014-02-08 MED ORDER — CIPROFLOXACIN HCL 500 MG PO TABS: 500.0000 mg | ORAL_TABLET | Freq: Two times a day (BID) | ORAL | Status: DC

## 2014-02-08 MED ORDER — SODIUM CHLORIDE 0.9 % IV SOLN
1000.0000 mL | INTRAVENOUS | Status: DC
  Administered 2014-02-08: 1000 mL via INTRAVENOUS

## 2014-02-08 MED ORDER — ONDANSETRON HCL 4 MG PO TABS: 4.0000 mg | ORAL_TABLET | Freq: Four times a day (QID) | ORAL | Status: DC

## 2014-02-08 MED ORDER — SODIUM CHLORIDE 0.9 % IV BOLUS (SEPSIS)
30.0000 mL/kg | Freq: Once | INTRAVENOUS | Status: AC
  Administered 2014-02-08: 1593 mL via INTRAVENOUS

## 2014-02-08 MED ORDER — DIPHENHYDRAMINE HCL 50 MG/ML IJ SOLN
25.0000 mg | Freq: Once | INTRAMUSCULAR | Status: AC
  Administered 2014-02-08: 25 mg via INTRAVENOUS
  Filled 2014-02-08: qty 1

## 2014-02-08 MED ORDER — DEXTROSE 5 % IV SOLN
1.0000 g | Freq: Once | INTRAVENOUS | Status: AC
  Administered 2014-02-08: 1 g via INTRAVENOUS
  Filled 2014-02-08: qty 10

## 2014-02-08 MED ORDER — SODIUM CHLORIDE 0.9 % IV BOLUS (SEPSIS)
1000.0000 mL | Freq: Once | INTRAVENOUS | Status: AC
  Administered 2014-02-08: 1000 mL via INTRAVENOUS

## 2014-02-08 NOTE — ED Notes (Signed)
Pt eating graham crackers and peanut butter at this time.

## 2014-02-08 NOTE — ED Provider Notes (Signed)
CSN: [id number]     Arrival date & time 02/08/14  1624 History   First MD Initiated Contact with Patient 02/08/14 1739     Chief Complaint  Patient presents with  . Chest Pain  . Atrial Fibrillation     HPI Turkey Ramsey Midgett is a 28 y.o. female with PMH of IVDU, nephrolithiasis, AFib with ablation, depression and bipolar presenting with midline chest pain that started yesterday and has been persistent until dose of nitro, asa given by EMS today which resolved the pain. Pain did not radiate and nothing made it worse. Pain was not exertional. She is asymptomatic in ED today. Patient denies hypertension, dyslipidemia, DM prior CAD, premature death in family member. patient smokes. Patient endorses HA with gradual onset that started yesterday. HA is improving. She denies and weakness, visual changes or slurred speech. Patient with suprapubic abdominal pain, nausea, vomiting x12 today which is non bloody non bilious. Patient endorses dysuria and foul smelling urine. Patient reports left sided back pain. Patient states last use of IVdrugs was one month ago. Patient endorses increased stress. Patient is a current smoker. Denies recent illnesses, cough, congestion or SOB. Followed by Dr. Johney Frame for afib.   Past Medical History  Diagnosis Date  . Asthma   . Atrial fibrillation     LV function lower limit of normal   . GERD (gastroesophageal reflux disease)   . Depression   . Polysubstance dependence including opioid type drug, continuous use     prior heroin overdose  . Left against medical advice 12/12/13  . Bipolar disorder    Past Surgical History  Procedure Laterality Date  . Catheter ablation      she reports prior ablatio (More than 5 years ago) by Dr Rudolpho Sevin in [facility]   Family History  Problem Relation Age of Onset  . Asthma Brother   . Hypertension Mother   . Hypertension Maternal Grandmother    History  Substance Use Topics  . Smoking status: Current Every Day Smoker --  0.50 packs/day    Types: Cigarettes  . Smokeless tobacco: Not on file     Comment: since 2002; smokes about 8 cig/day   . Alcohol Use: No     Comment: heavy and frequent -quit over 2 months ago   OB History   Grav Para Term Preterm Abortions TAB SAB Ect Mult Living   Review of Systems  Constitutional: Positive for fever. Negative for chills.  HENT: Negative for congestion and rhinorrhea.   Eyes: Negative for visual disturbance.  Respiratory: Negative for cough and shortness of breath.   Cardiovascular: Positive for chest pain. Negative for palpitations.  Gastrointestinal: Positive for nausea and vomiting. Negative for diarrhea.  Genitourinary: Positive for dysuria. Negative for hematuria.  Musculoskeletal: Negative for back pain and gait problem.  Skin: Negative for rash.  Neurological: Positive for headaches. Negative for weakness.      Allergies  Review of patient's allergies indicates no known allergies.  Home Medications   Prior to Admission medications   Medication Sig Start Date End Date Taking? Authorizing Provider  albuterol (PROVENTIL HFA;VENTOLIN HFA) 108 (90 BASE) MCG/ACT inhaler Inhale 1-2 puffs into the lungs every 6 (six) hours as needed for wheezing or shortness of breath.   Yes Historical Provider, MD  clonazePAM (KLONOPIN) 0.5 MG tablet Take 0.5 mg by mouth 2 (two) times daily as needed for anxiety.   Yes Historical  Provider, MD  Fluticasone-Salmeterol (ADVAIR) 250-50 MCG/DOSE AEPB Inhale 1 puff into the lungs 2 (two) times daily.   Yes Historical Provider, MD  ipratropium-albuterol (DUONEB) 0.5-2.5 (3) MG/3ML SOLN Take 3 mLs by nebulization 3 (three) times daily as needed (wheezing and shortness of breath). Pharmacy may substitute for generic equivalents 04/29/12  Yes Clanford Cyndie Mull, MD  ciprofloxacin (CIPRO) 500 MG tablet Take 1 tablet (500 mg total) by mouth 2 (two) times daily. 02/08/14   Louann Sjogren, PA-C  ondansetron (ZOFRAN)  4 MG tablet Take 1 tablet (4 mg total) by mouth every 6 (six) hours. 02/08/14   Benetta Spar L Pollyanna Levay, PA-C   BP 97/41  Pulse 77  Temp(Src) 100.2 F (37.9 C) (Oral)  Resp 17  Ht  (1.651 m)  Wt 117 lb (53.071 kg)  BMI 19.47 kg/m2  SpO2 94% Physical Exam  Nursing note and vitals reviewed. Constitutional: She appears well-developed and well-nourished. No distress.  HENT:  Head: Normocephalic and atraumatic.  Mouth/Throat: Oropharynx is clear and moist.  Eyes: Conjunctivae and EOM are normal. Pupils are equal, round, and reactive to light. Right eye exhibits no discharge. Left eye exhibits no discharge. No scleral icterus.  Neck: Normal range of motion. Neck supple. No JVD present. No tracheal deviation present.  No nuchal rigidity  Cardiovascular: Normal rate, regular rhythm, normal heart sounds and intact distal pulses.   No murmur heard. Pulmonary/Chest: Effort normal and breath sounds normal. No respiratory distress. She has no wheezes.  Abdominal: Soft. Bowel sounds are normal. She exhibits no distension.  Mild suprapubic abdominal tenderness. No guarding or rebound. No signs of peritonitis.  Musculoskeletal: Normal range of motion. She exhibits no tenderness.  Diffuse lower back tenderness with no to minimal midline tenderness with moderate lower left sided back tenderness. No left sided CVA tenderness  Neurological: She is alert. No cranial nerve deficit. She exhibits normal muscle tone. Coordination normal.  Strength 5/5 in upper and lower extremities. Sensation intact. Intact rapid alternating movements, finger to nose, and heel to shin. Negative Romberg. Normal gait.   Skin: Skin is warm and dry. She is not diaphoretic.  Psychiatric: She has a normal mood and affect. Her behavior is normal.    ED Course  Procedures (including critical care time) Labs Review Labs Reviewed  CBC - Abnormal; Notable for the following:    WBC 17.2 (*)    All other components within normal  limits  BASIC METABOLIC PANEL - Abnormal; Notable for the following:    Sodium 136 (*)    All other components within normal limits  URINALYSIS, ROUTINE W REFLEX MICROSCOPIC - Abnormal; Notable for the following:    Color, Urine AMBER (*)    APPearance CLOUDY (*)    Ketones, ur 15 (*)    Nitrite POSITIVE (*)    All other components within normal limits  URINE MICROSCOPIC-ADD ON - Abnormal; Notable for the following:    Squamous Epithelial / LPF FEW (*)    Bacteria, UA MANY (*)    All other components within normal limits  CULTURE, BLOOD (ROUTINE X 2)  CULTURE, BLOOD (ROUTINE X 2)  URINE CULTURE  PREGNANCY, URINE  I-STAT TROPOININ, ED  I-STAT CG4 LACTIC ACID, ED    Imaging Review No results found.   EKG Interpretation   Date/Time:  Monday February 08 2014 16:37:59 EDT Ventricular Rate:  115 PR Interval:  160 QRS Duration: 74 QT Interval:  466 QTC Calculation: 644 R Axis:   89 Text Interpretation:  Critical Test Result: Long QTc Sinus tachycardia  with Premature atrial complexes T wave abnormality, consider anterolateral  ischemia Prolonged QT Abnormal ECG Sinus tachycardia Premature atrial  complexes T wave abnormality QT prolonged ?Marland Kitchen Abnormal ekg Confirmed by  Gerhard Munch  MD [phone]) on 02/08/2014 5:54:54 PM     Meds given in ED:  Medications  sodium chloride 0.9 % bolus 1,000 mL (0 mLs Intravenous Stopped 02/08/14 2043)  sodium chloride 0.9 % bolus 1,593 mL (0 mLs Intravenous Stopped 02/08/14 2044)  cefTRIAXone (ROCEPHIN) 1 g in dextrose 5 % 50 mL IVPB (0 g Intravenous Stopped 02/08/14 2220)  diphenhydrAMINE (BENADRYL) injection 25 mg (25 mg Intravenous Given 02/08/14 2049)  metoCLOPramide (REGLAN) injection 10 mg (10 mg Intravenous Given 02/08/14 2051)  ketorolac (TORADOL) 30 MG/ML injection 30 mg (30 mg Intravenous Given 02/08/14 2046)    Discharge Medication List as of 02/08/2014 10:08 PM    START taking these medications   Details  ciprofloxacin (CIPRO) 500 MG  tablet Take 1 tablet (500 mg total) by mouth 2 (two) times daily., Starting 02/08/2014, Until Discontinued, Print    ondansetron (ZOFRAN) 4 MG tablet Take 1 tablet (4 mg total) by mouth every 6 (six) hours., Starting 02/08/2014, Until Discontinued, Print          MDM   Final diagnoses:  Pyelonephritis  Atypical chest pain   [name] is a 28 y.o. female with PMH of IVDU, Afib, Depression, Bipolar presenting with chest pain that was nonexertional, resolved with nitroglycerin and associated with increased stress. Patient smokes but no other risk factors with a low Heart score. Chest pain is not likely of cardiac etiology d/t presentation, VSS, no JVD or new murmur, RRR, breath sounds equal bilaterally, EKG with sinus tachycardia, PACs and nonspecific T wave abnormalities, negative troponin. Pt has been advised to come back to the ED is CP becomes exertional, associated with diaphoresis or nausea, radiates to left jaw/arm, worsens or becomes concerning in any way. Follow up with Dr. Johney Frame her Cardiologist in 1-2 days. Pt agreeable for follow up and is agreeable to discharge.  Pt HA treated and improved while in ED.  Presentation is like pts typical HA and non concerning for [facility], ICH, Meningitis, or temporal arteritis. Pt is with no focal neuro deficits, nuchal rigidity, or change in vision. Pt is to follow up with PCP. Pt verbalizes understanding and is agreeable with plan to dc.  Pt has been diagnosed with a pyelonephritis. Pt is febrile, no CVA tenderness, normotensive, with nausea, vomiting and left sided back pain. Patient given IV fluids and dose of IV rocephin in ED. Patient tolerated PO in ED and no vomiting. Pt to be dc home with antibiotics and instructions to follow up with PCP. Resources for establishing care with PCP provided.  Discussed return precautions with patient. Discussed all results and patient verbalizes understanding and agrees with plan.  This is a shared patient.  This patient was discussed with the physician, Dr. Jeraldine Loots who saw and evaluated the patient and agrees with plan to discharge.     Louann Sjogren, PA-C 02/10/14 1230

## 2014-02-08 NOTE — Discharge Instructions (Signed)
Return to the emergency room with worsening of symptoms, new symptoms or with symptoms that are concerning, especially persistent fevers, severe abdominal pain, intractable vomiting and unable to keep things down, severe back pain. Establish care and follow up with primary care provider. Resources provided below.  Please take all of your antibiotics until finished!   You may develop abdominal discomfort or diarrhea from the antibiotic.  You may help offset this with probiotics which you can buy or get in yogurt. Do not eat  or take the probiotics until 2 hours after your antibiotic.  zofran for nausea. Take as needed.   Pyelonephritis, Adult Pyelonephritis is a kidney infection. In general, there are 2 main types of pyelonephritis:  Infections that come on quickly without any warning (acute pyelonephritis).  Infections that persist for a long period of time (chronic pyelonephritis). CAUSES  Two main causes of pyelonephritis are:  Bacteria traveling from the bladder to the kidney. This is a problem especially in pregnant women. The urine in the bladder can become filled with bacteria from multiple causes, including:  Inflammation of the prostate gland (prostatitis).  Sexual intercourse in females.  Bladder infection (cystitis).  Bacteria traveling from the bloodstream to the tissue part of the kidney. Problems that may increase your risk of getting a kidney infection include:  Diabetes.  Kidney stones or bladder stones.  Cancer.  Catheters placed in the bladder.  Other abnormalities of the kidney or ureter. SYMPTOMS   Abdominal pain.  Pain in the side or flank area.  Fever.  Chills.  Upset stomach.  Blood in the urine (dark urine).  Frequent urination.  Strong or persistent urge to urinate.  Burning or stinging when urinating. DIAGNOSIS  Your caregiver may diagnose your kidney infection based on your symptoms. A urine sample may also be taken. TREATMENT  In  general, treatment depends on how severe the infection is.   If the infection is mild and caught early, your caregiver may treat you with oral antibiotics and send you home.  If the infection is more severe, the bacteria may have gotten into the bloodstream. This will require intravenous (IV) antibiotics and a hospital stay. Symptoms may include:  High fever.  Severe flank pain.  Shaking chills.  Even after a hospital stay, your caregiver may require you to be on oral antibiotics for a period of time.  Other treatments may be required depending upon the cause of the infection. HOME CARE INSTRUCTIONS   Take your antibiotics as directed. Finish them even if you start to feel better.  Make an appointment to have your urine checked to make sure the infection is gone.  Drink enough fluids to keep your urine clear or pale yellow.  Take medicines for the bladder if you have urgency and frequency of urination as directed by your caregiver. SEEK IMMEDIATE MEDICAL CARE IF:   You have a fever or persistent symptoms for more than 2-3 days.  You have a fever and your symptoms suddenly get worse.  You are unable to take your antibiotics or fluids.  You develop shaking chills.  You experience extreme weakness or fainting.  There is no improvement after 2 days of treatment. MAKE SURE YOU:  Understand these instructions.  Will watch your condition.  Will get help right away if you are not doing well or get worse. Document Released: 05/21/2005 Document Revised: 11/20/2011 Document Reviewed: 10/25/2010 [facility] Patient Information 2015 [place], [place]. This information is not intended to replace advice given to you  by your health care provider. Make sure you discuss any questions you have with your health care provider.   Emergency Department Resource Guide 1) Find a Doctor and Pay Out of Pocket Although you won't have to find out who is covered by your insurance plan, it is a good  idea to ask around and get recommendations. You will then need to call the office and see if the doctor you have chosen will accept you as a new patient and what types of options they offer for patients who are self-pay. Some doctors offer discounts or will set up payment plans for their patients who do not have insurance, but you will need to ask so you aren't surprised when you get to your appointment.  2) Contact Your Local Health Department Not all health departments have doctors that can see patients for sick visits, but many do, so it is worth a call to see if yours does. If you don't know where your local health department is, you can check in your phone book. The CDC also has a tool to help you locate your state's health department, and many state websites also have listings of all of their local health departments.  3) Find a Walk-in Clinic If your illness is not likely to be very severe or complicated, you may want to try a walk in clinic. These are popping up all over the country in pharmacies, drugstores, and shopping centers. They're usually staffed by nurse practitioners or physician assistants that have been trained to treat common illnesses and complaints. They're usually fairly quick and inexpensive. However, if you have serious medical issues or chronic medical problems, these are probably not your best option.  No Primary Care Doctor: - Call Health Connect at  [phone] - they can help you locate a primary care doctor that  accepts your insurance, provides certain services, etc. - Physician Referral Service- [phone]  Chronic Pain Problems: Organization         Address  Phone   Notes  [facility]  [phone] Patients need to be referred by their primary care doctor.   Medication Assistance: Organization         Address  Phone   Notes  [facility] Medication [facility] [address] [place], [place] [address] [phone]  --Must be a resident of [facility] -- Must have NO insurance coverage whatsoever (no Medicaid/ Medicare, etc.) -- The pt. MUST have a primary care doctor that directs their care regularly and follows them in the community   [facility]  [phone]   [employer]  [phone]    Agencies that provide inexpensive medical care: Organization         Address  Phone   Notes  [facility]  [phone]   [facility]    [phone]   [facility] [address] [place], [place] [address] [phone]   [facility] [address] [place], [place] [phone]   Planned Parenthood    [phone]   [facility]    [phone]   [facility]  [address], [place] Phone:  [phone], Fax:  [phone] Hours of Operation:  9 am - 6 pm, M-F.  Also accepts Medicaid/Medicare and self-pay.  [facility]  [address], [place] Phone: [phone], Fax: [phone].  Hours of Operation:  8:30 am - 5:30 pm, M-F.  Also accepts Medicaid and self-pay.  [facility] [place] [address], [place] Phone: [phone]   [facility] [address] Natasha Bence [place], [place] [phone], Ext. [phone] Mondays & Thursdays: 7-9 AM.  First 15 patients are seen on a first come, first serve basis.    Medicaid-accepting [facility] Providers:  Organization         Address  Phone   Notes  [facility] [address], [place] [phone] Also accepts self-pay patients.  [facility] [address] Laurell Josephs [place], [place]  [phone]   [facility] [address], [place] [phone]   [facility] [address] [place] [phone]   Renaye Rakers [address], [place]   [phone] Only  accepts [place] Access [place] patients after they have their name applied to their card.   Self-Pay (no insurance) in [facility]:  Organization         Address  Phone   Notes  Sickle Cell Patients, [facility] [address] [place], [place] [phone]   [facility] Urgent Care [address] [place], [place] [phone]   [facility] [place]  [address], [place] [phone]   [facility]/Dr. Osei-Bonsu  [address], [place] or [address], [place] [phone] Phone number for both [place] and [place] locations is the same.  [facility]gent [facility] and [facility] [address], [place] [phone]   [facility] [address], [place] or [address] [phone]   [facility] [address], [place] [phone], phone; [phone], fax Sees patients 1st and 3rd Saturday of every month.  Must not qualify for public or private insurance (i.e. Medicaid, Medicare, [place] Health Choice, Veterans' Benefits)  Household income should be no more than 200% of the poverty level The clinic cannot treat you if you are pregnant or think you are pregnant  Sexually transmitted diseases are not treated at the clinic.    Dental Care: Organization         Address  Phone  Notes  [facility] Department of [facility] [address] [place], [place] [phone] Accepts children up to age 65 who are enrolled in [place] or [place] Health Choice; pregnant women with a Medicaid card; and children who have applied for Medicaid or [place] Health Choice, but were declined, whose parents can pay a reduced fee at time of service.  [facility] Department of [facility]  [address], [place] [phone] Accepts children up to age 14 who are enrolled in [place] or [place] Health Choice; pregnant  women with a Medicaid card; and children who have applied for Medicaid or [place] Health Choice, but were declined, whose parents can pay a reduced fee at time of service.  [facility]  [address] [place], [place] [phone] Patients are seen by appointment only. Walk-ins are not accepted. [facility] will see patients 13 years of age and older. Monday - Tuesday (8am-5pm) Most Wednesdays (8:30-5pm) $30 per visit, cash only  [facility] Adult [employer] PROGRAM  [address], [facility] [phone] Patients  are seen by appointment only. Walk-ins are not accepted. [facility] will see patients 54 years of age and older. One Wednesday Evening (Monthly: Volunteer Based).  $30 per visit, cash only  [employer]  [phone] for adults; Children under age 55, call Graduate Pediatric Dentistry at [phone]. Children aged 14-14, please call [phone] to request a pediatric application.  Dental services are provided in all areas of dental care including fillings, crowns and bridges, complete and partial dentures, implants, gum treatment, root canals, and extractions. Preventive care is also provided. Treatment is provided to both adults and children. Patients are selected via a lottery and there is often a waiting list.   [facility] [address] [place]  [phone] [email or web address]   [facility] [address] [place], [place] [phone], Ext. [phone] Second and Fourth Thursday of each month, opens at 6:30 AM; Clinic ends at 9 AM.  Patients are seen on a first-come first-served basis, and a limited number are seen during each clinic.   [facility]  [address] Ether Griffins [place], [place] [phone]   Eligibility Requirements You must have lived in [place], [place], or [place] counties for at least the last three months.   You cannot be eligible for state or federal sponsored  [place], including [employer], [place], or [employer].   You generally cannot be eligible for healthcare insurance through your employer.    How to apply: Eligibility screenings are held every Tuesday and Wednesday afternoon from 1:00 pm until 4:00 pm. You do not need an appointment for the interview!  [facility] [address], [place], [place] [phone]   [facility] Health Department  [phone]   [facility] Health Department  [phone]   [facility] Health [facility]  [phone]    Behavioral Health Resources in the Community: Intensive Outpatient Programs Organization         Address  Phone  Notes  [facility] [address] [place], [place] [phone]   [facility] Outpatient [address] [place], [place] [phone]   [facility] [address] [place], [place]  [phone]   [facility] Mental Health [address]  [place], [place] [phone] or [phone]   Substance Abuse Resources Organization         Address  Phone  Notes  [facility] Drug Services  [phone]   [facility]  [phone]   [facility]  [phone]   Floydene Flock  [phone]   Residential & Outpatient Substance Abuse Program  [phone]   Psychological Services Organization         Address  Phone  Notes  [facility] Behavioral Health  [phone]   [facility] Services  [phone]   [facility] Mental Health [address], [place] [phone] or [phone]    Mobile Crisis Teams Organization         Address  Phone  Notes  [facility], Mobile Crisis Care Unit  [phone]   [facility]  [address] [place] [address] [place], [place] [phone]   Doristine Locks [address] [place] [phone]    Self-Help/Support Groups Organization         Address  Phone              Notes  Mental Health Assoc. of  - variety of support groups  336- I7437963 Call  for more information  Narcotics Anonymous (NA), [facility] [address], [employer] [place]  2 meetings at this location   Residential Sports administrator         Address  Phone  Notes  [facility] [address],    [place]  [phone]   [facility]  [address] [place] [address], [place], [place] [phone]   [facility] Treatment [facility] [address] [place], [place] [phone] Admissions: 8am-3pm M-F  [facility] [address]    [place], [place] [phone]   [facility] [address] [place], [place], [place] [phone]   [facility] [address],  [place], [place] [phone]   [facility] - Intensive Outpatient [address] Laurell Josephs [address], [place], [place] [phone]   [facility] (Addiction Recovery [facility] [address] [place]  [place], [place] [phone] or [phone]   [facility] ([facility]) [address] [place], [place] [phone] Accepts Medicaid  [facility] [address],  [place] [phone] Substance Abuse/Addiction Treatment   [facility] Organization         Address  Phone  Notes  [facility]  [phone]   Angie Fava, PhD [address] Ervin Knack [place], [place]   [phone] or [phone]   [facility] Behavioral   [address] [place], [place] [phone]   [facility] [address] [place], [place] [phone] Insurance/Medicaid/sponsorship through [facility] and Families [address]                                    [place], [place] [phone] Therapy/tele-psych/case  [facility] [address], [place] [phone]    Dr. Lolly Mustache  [phone]   [facility]  [employer] [facility] Dept. 1) [address], [place] 2) [address] [place] 3)  [address], [place] [phone]  [phone]   [facility] Child Abuse Hotline 3371915315 or [phone] (After Hours)

## 2014-02-08 NOTE — ED Notes (Signed)
Pt st's she fells much better at this time.  St's pain has subsided.

## 2014-02-08 NOTE — ED Notes (Signed)
Pt to department via EMS- pt reports that she started having chest pain this afternoon. Reports a hx of a-fib and takes Cardizem. Hx of ablation. Hr-130 per EMS in a-fib. Bp-100/58 324 asa, 1 nitro  Zofran. 20g LAC

## 2014-02-08 NOTE — ED Notes (Signed)
Pt denies any chest pain at this time 

## 2014-02-10 NOTE — ED Provider Notes (Signed)
   This was a shared visit with a mid-level provided (NP or PA).  Throughout the patient's course I was available for consultation/collaboration.  I saw the ECG (if appropriate), relevant labs and studies - I agree with the interpretation.  On my exam the patient was in no distress.  She and I had a lengthy conversation about her prior drug use, her history of atrial fibrillation, and that today's evaluation for back pain. The patient was ambulatory, awake, or, speaking clearly, with no evidence for sepsis. Patient had no appreciable murmur, had clear lung sounds, was hemodynamically stable aside from ongoing atrial fibrillation.        EKG Interpretation   Date/Time:  Monday February 08 2014 16:37:59 EDT Ventricular Rate:  115 PR Interval:  160 QRS Duration: 74 QT Interval:  466 QTC Calculation: 644 R Axis:   89 Text Interpretation:  Critical Test Result: Long QTc Sinus tachycardia  with Premature atrial complexes T wave abnormality, consider anterolateral  ischemia Prolonged QT Abnormal ECG Sinus tachycardia Premature atrial  complexes T wave abnormality QT prolonged ?Marland Kitchen Abnormal ekg Confirmed by  Gerhard Munch  MD [phone]) on 02/08/2014 5:54:54 PM         Gerhard Munch, MD 02/10/14 [phone]

## 2014-02-12 LAB — URINE CULTURE: Colony Count: >=100000

## 2014-02-14 ENCOUNTER — Telehealth (HOSPITAL_BASED_OUTPATIENT_CLINIC_OR_DEPARTMENT_OTHER): Payer: Self-pay

## 2014-02-14 NOTE — Progress Notes (Signed)
ED Antimicrobial Stewardship Positive Culture Follow Up   [name] is an 28 y.o. female who presented to [facility] on 02/08/2014 with a chief complaint of pyelonephritis Chief Complaint  Patient presents with  . Chest Pain  . Atrial Fibrillation    Recent Results (from the past 720 hour(s))  CULTURE, BLOOD (ROUTINE X 2)     Status: None   Collection Time    02/08/14  6:37 PM      Result Value Ref Range Status   Specimen Description BLOOD FOREARM LEFT   Final   Special Requests BOTTLES DRAWN AEROBIC AND ANAEROBIC 5CC   Final   Culture  Setup Time     Final   Value: 02/09/2014 01:27     Performed at [facility]   Culture     Final   Value:        BLOOD CULTURE RECEIVED NO GROWTH TO DATE CULTURE WILL BE HELD FOR 5 DAYS BEFORE ISSUING A FINAL NEGATIVE REPORT     Performed at [facility]   Report Status PENDING   Incomplete  URINE CULTURE     Status: None   Collection Time    02/08/14  7:04 PM      Result Value Ref Range Status   Specimen Description URINE, CLEAN CATCH   Final   Special Requests NONE   Final   Culture  Setup Time     Final   Value: 02/08/2014 20:12     Performed at [facility] Count     Final   Value: >=100,000 COLONIES/ML     Performed at [facility]   Culture     Final   Value: ESCHERICHIA COLI     Note: Two isolates with different morphologies were identified as the same organism.The most resistant organism was reported.     Performed at [facility]   Report Status 02/12/2014 FINAL   Final   Organism ID, Bacteria ESCHERICHIA COLI   Final  CULTURE, BLOOD (ROUTINE X 2)     Status: None   Collection Time    02/08/14  7:25 PM      Result Value Ref Range Status   Specimen Description BLOOD HAND RIGHT   Final   Special Requests BOTTLES DRAWN AEROBIC AND ANAEROBIC B 5CC R 4CC   Final   Culture  Setup Time     Final   Value: 02/09/2014 01:23     Performed at [facility]   Culture      Final   Value:        BLOOD CULTURE RECEIVED NO GROWTH TO DATE CULTURE WILL BE HELD FOR 5 DAYS BEFORE ISSUING A FINAL NEGATIVE REPORT     Performed at [facility]   Report Status PENDING   Incomplete     Treated with Ciprofloxacin, organism resistant to prescribed antimicrobial  Patient discharged originally without antimicrobial agent and treatment is now indicated  New antibiotic prescription: Keflex 500 mg three times daily x 10 days   ED Provider: Susanne Greenhouse, PharmD.  Clinical Pharmacist Pager [phone]

## 2014-02-14 NOTE — Telephone Encounter (Signed)
Post ED Visit - Positive Culture Follow-up: Successful Patient Follow-Up  Culture assessed and recommendations reviewed by:  Wes Dulaney, Pharm.D., BCPS  Celedonio Miyamoto, [address]D., BCPS  Georgina Pillion, Pharm.D., BCPS  [place], [address].D., BCPS, AAHIVP  Estella Husk, Pharm.D., BCPS, AAHIVP  Red Christians, Pharm.D.  Cassie [place], [place].DRolanda Jay Mancheril RPh  Positive Urine culture, >/= 100,000 colonies -> E Coli   Patient discharged without antimicrobial prescription and treatment is now indicated  Organism is resistant to prescribed ED discharge antimicrobial, Cipro   Patient with positive blood cultures  Changes discussed with ED provider: A. Harris PA New antibiotic prescription "Keflex 500 mg TID x 10 days #30" Called to   Contacted patient, date 9/13, time 19:25  Called pt voice mailbox full.   Arvid Right 02/14/2014, 7:27 PM

## 2014-02-15 LAB — CULTURE, BLOOD (ROUTINE X 2)
CULTURE: NO GROWTH
Culture: NO GROWTH

## 2014-02-21 ENCOUNTER — Telehealth (HOSPITAL_COMMUNITY): Payer: Self-pay

## 2014-02-21 NOTE — ED Notes (Signed)
Unable to reach by telephone. Letter sent to address on record.  

## 2014-02-28 ENCOUNTER — Telehealth (HOSPITAL_BASED_OUTPATIENT_CLINIC_OR_DEPARTMENT_OTHER): Payer: Self-pay | Admitting: Emergency Medicine

## 2014-02-28 NOTE — Telephone Encounter (Signed)
Post ED Visit - Positive Culture Follow-up: Successful Patient Follow-Up  Culture assessed and recommendations reviewed by:  Wes Dulaney, Pharm.D., BCPS  Celedonio Miyamoto, [address]D., BCPS  Georgina Pillion, [address]D., BCPS  [place], [address]D., BCPS, AAHIVP  Estella Husk, Pharm.D., BCPS, AAHIVP  Red Christians, Pharm.D.  Cassie Roseanne [place], [place].D.  Positive Urine culture   Patient discharged without antimicrobial prescription and treatment is now indicated  Organism is resistant to prescribed ED discharge antimicrobial  Patient with positive blood cultures  Changes discussed with ED provider: Arthor Captain PA New antibiotic prescription Keflex 500 mg TID x 10 days Called to [facility] [phone]  Contacted patient, date 02/26/14, time 1511   Jiles Harold 02/28/2014, 5:10 PM

## 2014-04-05 ENCOUNTER — Encounter (HOSPITAL_COMMUNITY): Payer: Self-pay | Admitting: Emergency Medicine

## 2014-05-31 ENCOUNTER — Encounter (HOSPITAL_COMMUNITY): Payer: Self-pay | Admitting: Emergency Medicine

## 2014-05-31 ENCOUNTER — Emergency Department (HOSPITAL_COMMUNITY)
Admission: EM | Admit: 2014-05-31 | Discharge: 2014-05-31 | Discharge status: Against Medical Advice | Payer: Self-pay | Attending: Emergency Medicine | Admitting: Emergency Medicine

## 2014-05-31 DIAGNOSIS — Z720 Tobacco use: Secondary | ICD-10-CM | POA: Insufficient documentation

## 2014-05-31 DIAGNOSIS — L0291 Cutaneous abscess, unspecified: Secondary | ICD-10-CM | POA: Insufficient documentation

## 2014-05-31 DIAGNOSIS — J45909 Unspecified asthma, uncomplicated: Secondary | ICD-10-CM | POA: Insufficient documentation

## 2014-05-31 NOTE — ED Notes (Signed)
Pt has large abscess to a/c- noticed 2-3 days ago

## 2014-06-01 ENCOUNTER — Emergency Department (HOSPITAL_COMMUNITY): Payer: Self-pay

## 2014-06-01 ENCOUNTER — Encounter (HOSPITAL_COMMUNITY): Payer: Self-pay | Admitting: Emergency Medicine

## 2014-06-01 ENCOUNTER — Inpatient Hospital Stay (HOSPITAL_COMMUNITY)
Admission: EM | Admit: 2014-06-01 | Discharge: 2014-06-02 | DRG: 300 | Discharge status: Against Medical Advice | Payer: Self-pay | Attending: Internal Medicine | Admitting: Internal Medicine

## 2014-06-01 DIAGNOSIS — R918 Other nonspecific abnormal finding of lung field: Secondary | ICD-10-CM | POA: Diagnosis present

## 2014-06-01 DIAGNOSIS — F17210 Nicotine dependence, cigarettes, uncomplicated: Secondary | ICD-10-CM | POA: Diagnosis present

## 2014-06-01 DIAGNOSIS — N390 Urinary tract infection, site not specified: Secondary | ICD-10-CM | POA: Diagnosis present

## 2014-06-01 DIAGNOSIS — I480 Paroxysmal atrial fibrillation: Secondary | ICD-10-CM | POA: Diagnosis present

## 2014-06-01 DIAGNOSIS — Z79899 Other long term (current) drug therapy: Secondary | ICD-10-CM

## 2014-06-01 DIAGNOSIS — B182 Chronic viral hepatitis C: Secondary | ICD-10-CM | POA: Diagnosis present

## 2014-06-01 DIAGNOSIS — F1110 Opioid abuse, uncomplicated: Secondary | ICD-10-CM | POA: Diagnosis present

## 2014-06-01 DIAGNOSIS — I770 Arteriovenous fistula, acquired: Principal | ICD-10-CM | POA: Diagnosis present

## 2014-06-01 DIAGNOSIS — R079 Chest pain, unspecified: Secondary | ICD-10-CM

## 2014-06-01 DIAGNOSIS — I4891 Unspecified atrial fibrillation: Secondary | ICD-10-CM | POA: Diagnosis present

## 2014-06-01 DIAGNOSIS — F319 Bipolar disorder, unspecified: Secondary | ICD-10-CM | POA: Diagnosis present

## 2014-06-01 DIAGNOSIS — F329 Major depressive disorder, single episode, unspecified: Secondary | ICD-10-CM | POA: Diagnosis present

## 2014-06-01 DIAGNOSIS — K219 Gastro-esophageal reflux disease without esophagitis: Secondary | ICD-10-CM | POA: Diagnosis present

## 2014-06-01 DIAGNOSIS — F1990 Other psychoactive substance use, unspecified, uncomplicated: Secondary | ICD-10-CM | POA: Diagnosis present

## 2014-06-01 DIAGNOSIS — I479 Paroxysmal tachycardia, unspecified: Secondary | ICD-10-CM

## 2014-06-01 DIAGNOSIS — L02413 Cutaneous abscess of right upper limb: Secondary | ICD-10-CM | POA: Diagnosis present

## 2014-06-01 DIAGNOSIS — L0390 Cellulitis, unspecified: Secondary | ICD-10-CM | POA: Diagnosis present

## 2014-06-01 DIAGNOSIS — F419 Anxiety disorder, unspecified: Secondary | ICD-10-CM | POA: Diagnosis present

## 2014-06-01 DIAGNOSIS — J45909 Unspecified asthma, uncomplicated: Secondary | ICD-10-CM | POA: Diagnosis present

## 2014-06-01 LAB — BASIC METABOLIC PANEL
ANION GAP: 7 (ref 5–15)
BUN: 11 mg/dL (ref 6–23)
CALCIUM: 8.9 mg/dL (ref 8.4–10.5)
CHLORIDE: 103 meq/L (ref 96–112)
CO2: 24 mmol/L (ref 19–32)
Creatinine, Ser: 0.98 mg/dL (ref 0.50–1.10)
GFR calc Af Amer: >90 mL/min (ref >90)
GFR, EST NON AFRICAN AMERICAN: 78 mL/min — AB (ref >90)
Glucose, Bld: 112 mg/dL — ABNORMAL HIGH (ref 70–99)
Potassium: 3.7 mmol/L (ref 3.5–5.1)
Sodium: 134 mmol/L — ABNORMAL LOW (ref 135–145)

## 2014-06-01 LAB — I-STAT TROPONIN, ED: Troponin i, poc: 0 ng/mL (ref 0.00–0.08)

## 2014-06-01 LAB — CBC
HEMATOCRIT: 39.9 % (ref 36.0–46.0)
Hemoglobin: 13.4 g/dL (ref 12.0–15.0)
MCH: 29.1 pg (ref 26.0–34.0)
MCHC: 33.6 g/dL (ref 30.0–36.0)
MCV: 86.7 fL (ref 78.0–100.0)
PLATELETS: 176 10*3/uL (ref 150–400)
RBC: 4.6 MIL/uL (ref 3.87–5.11)
RDW: 14.3 % (ref 11.5–15.5)
WBC: 14.7 10*3/uL — AB (ref 4.0–10.5)

## 2014-06-01 MED ORDER — VANCOMYCIN HCL IN DEXTROSE 1-5 GM/200ML-% IV SOLN
1000.0000 mg | Freq: Once | INTRAVENOUS | Status: AC
  Administered 2014-06-02: 1000 mg via INTRAVENOUS
  Filled 2014-06-01: qty 200

## 2014-06-01 MED ORDER — PIPERACILLIN-TAZOBACTAM 3.375 G IVPB
3.3750 g | Freq: Once | INTRAVENOUS | Status: AC
  Administered 2014-06-01: 3.375 g via INTRAVENOUS
  Filled 2014-06-01: qty 50

## 2014-06-01 NOTE — ED Provider Notes (Signed)
CSN: [id number]     Arrival date & time 06/01/14  2152 History   First MD Initiated Contact with Patient 06/01/14 2254     Chief Complaint  Patient presents with  . Palpitations  . Chest Pain     (Consider location/radiation/quality/duration/timing/severity/associated sxs/prior Treatment) HPI Patient developed rapid heartbeat and chest pain accompanied by shortness of breath and low back pain 2 hours ago. Symptoms typical of atrial fibrillation she's had in the past. Nothing made symptoms better or worse. Symptoms resolved after 2 hours. She treated herself with cardiac XT 240 mg orally. She is presently asymptomatic except for a painful swollen area in her right arm where she injected last herself with IV drugs 4 weeks ago and she states the area and her right arm became swollen and painful 2 days ago, and started to drain pus. No other treatment prior to coming here no other associated symptoms. Past Medical History  Diagnosis Date  . Asthma   . Atrial fibrillation     LV function lower limit of normal   . GERD (gastroesophageal reflux disease)   . Depression   . Polysubstance dependence including opioid type drug, continuous use     prior heroin overdose  . Left against medical advice 12/12/13  . Bipolar disorder    Past Surgical History  Procedure Laterality Date  . Catheter ablation      she reports prior ablatio (More than 5 years ago) [facility]   Family History  Problem Relation Age of Onset  . Asthma Brother   . Hypertension Mother   . Hypertension Maternal Grandmother    History  Substance Use Topics  . Smoking status: Current Every Day Smoker -- 0.50 packs/day    Types: Cigarettes  . Smokeless tobacco: Not on file     Comment: since 2002; smokes about 8 cig/day   . Alcohol Use: No     Comment: heavy and frequent -quit over 2 months ago   OB History    Gravida Para Term Preterm AB TAB SAB Ectopic Multiple Living   5 2  2 3 2 1   2      Review  of Systems  Respiratory: Positive for shortness of breath.   Cardiovascular: Positive for chest pain.  Skin: Positive for wound.  All other systems reviewed and are negative.     Allergies  Review of patient's allergies indicates no known allergies.  Home Medications   Prior to Admission medications   Medication Sig Start Date End Date Taking? Authorizing Provider  albuterol (PROVENTIL HFA;VENTOLIN HFA) 108 (90 BASE) MCG/ACT inhaler Inhale 1-2 puffs into the lungs every 6 (six) hours as needed for wheezing or shortness of breath.   Yes Historical Provider, MD  clonazePAM (KLONOPIN) 0.5 MG tablet Take 0.5 mg by mouth 2 (two) times daily as needed for anxiety.   Yes Historical Provider, MD  diltiazem (CARDIZEM CD) 120 MG 24 hr capsule Take 120 mg by mouth daily.   Yes Historical Provider, MD  Fluticasone-Salmeterol (ADVAIR) 250-50 MCG/DOSE AEPB Inhale 1 puff into the lungs 2 (two) times daily.   Yes Historical Provider, MD  ipratropium-albuterol (DUONEB) 0.5-2.5 (3) MG/3ML SOLN Take 3 mLs by nebulization 3 (three) times daily as needed (wheezing and shortness of breath). Pharmacy may substitute for generic equivalents 04/29/12  Yes Clanford Cyndie MullL Johnson, MD  ciprofloxacin (CIPRO) 500 MG tablet Take 1 tablet (500 mg total) by mouth 2 (two) times daily. Patient not taking: Reported on 06/01/2014  02/08/14   Louann Sjogren, PA-C  ondansetron (ZOFRAN) 4 MG tablet Take 1 tablet (4 mg total) by mouth every 6 (six) hours. Patient not taking: Reported on 06/01/2014 02/08/14   Louann Sjogren, PA-C   BP 112/70 mmHg  Pulse 124  Temp(Src) 98.7 F (37.1 C)  Resp 14  SpO2 98% Physical Exam  Constitutional: She appears well-developed and well-nourished.  HENT:  Head: Normocephalic and atraumatic.  Eyes: Conjunctivae are normal. Pupils are equal, round, and reactive to light.  Neck: Neck supple. No tracheal deviation present. No thyromegaly present.  Cardiovascular:  No murmur heard. Tachycardic,  irregular  Pulmonary/Chest: Effort normal and breath sounds normal.  Abdominal: Soft. Bowel sounds are normal. She exhibits no distension. There is no tenderness.  Musculoskeletal: Normal range of motion. She exhibits no edema or tenderness.  Right upper extremity swollen tender area at antecubital, golf ball sized fossa. No red streaks up arm no axillary nodes radial pulse 2+ all other extremity is a redness or tenderness neurovascularly intact  Neurological: She is alert. Coordination normal.  Skin: Skin is warm and dry. No rash noted.  Psychiatric: She has a normal mood and affect.  Nursing note and vitals reviewed.   ED Course  Procedures (including critical care time) Labs Review Labs Reviewed  CBC - Abnormal; Notable for the following:    WBC 14.7 (*)    All other components within normal limits  BASIC METABOLIC PANEL - Abnormal; Notable for the following:    Sodium 134 (*)    Glucose, Bld 112 (*)    GFR calc non Af Amer 78 (*)    All other components within normal limits  I-STAT TROPOININ, ED    Imaging Review Dg Chest 2 View  06/01/2014   CLINICAL DATA:  Chest pain  EXAM: CHEST  2 VIEW  COMPARISON:  01/26/2014  FINDINGS: Normal heart size and mediastinal contours. No acute infiltrate or edema. No effusion or pneumothorax. No acute osseous findings. Bilateral breast implants.  IMPRESSION: No active cardiopulmonary disease.   Electronically Signed   By: Tiburcio Pea M.D.   On: 06/01/2014 22:27     Date: 06/02/2014  Rate: 140  Rhythm: sinus tachycardia and premature atrial contractions (PAC)  QRS Axis: normal  Intervals: normal  ST/T Wave abnormalities: nonspecific T wave changes  Conduction Disutrbances:none  Narrative Interpretation:   Old EKG Reviewed: unchanged  Results for orders placed or performed during the hospital encounter of 06/01/14  CBC  Result Value Ref Range   WBC 14.7 (H) 4.0 - 10.5 K/uL   RBC 4.60 3.87 - 5.11 MIL/uL   Hemoglobin 13.4 12.0 -  15.0 g/dL   HCT 45.4 09.8 - 11.9 %   MCV 86.7 78.0 - 100.0 fL   MCH 29.1 26.0 - 34.0 pg   MCHC 33.6 30.0 - 36.0 g/dL   RDW 14.7 82.9 - 56.2 %   Platelets 176 150 - 400 K/uL  Basic metabolic panel  Result Value Ref Range   Sodium 134 (L) 135 - 145 mmol/L   Potassium 3.7 3.5 - 5.1 mmol/L   Chloride 103 96 - 112 mEq/L   CO2 24 19 - 32 mmol/L   Glucose, Bld 112 (H) 70 - 99 mg/dL   BUN 11 6 - 23 mg/dL   Creatinine, Ser 1.30 0.50 - 1.10 mg/dL   Calcium 8.9 8.4 - 86.5 mg/dL   GFR calc non Af Amer 78 (L) >90 mL/min   GFR calc Af Amer >90 >90  mL/min   Anion gap 7 5 - 15  [facility] trop[facility])  Result Value Ref Range   Troponin i, poc 0.00 0.00 - 0.08 ng/mL   Comment 3           Dg Chest 2 View  06/01/2014   CLINICAL DATA:  Chest pain  EXAM: CHEST  2 VIEW  COMPARISON:  01/26/2014  FINDINGS: Normal heart size and mediastinal contours. No acute infiltrate or edema. No effusion or pneumothorax. No acute osseous findings. Bilateral breast implants.  IMPRESSION: No active cardiopulmonary disease.   Electronically Signed   By: Tiburcio PeaJonathan  Watts M.D.   On: 06/01/2014 22:27    MDM  Concern for pseudoaneurysm overlying right antecubital fossa plan. Spoke with Dr.Doutova who will arrange for inpatient stay. Plan intravenous antibiotics. Blood cultures pending Diagnosis #1 cardiac arrhythmia #2 chest pain #3 soft tissue infection right upper extremity Final diagnoses:  Chest pain        Doug SouSam Tru Rana, MD 06/02/14 [phone]

## 2014-06-01 NOTE — ED Notes (Signed)
Pt. reports palpitations and mid chest pain with SOB onset this evening .

## 2014-06-02 ENCOUNTER — Inpatient Hospital Stay (HOSPITAL_COMMUNITY): Payer: Self-pay

## 2014-06-02 ENCOUNTER — Telehealth: Payer: Self-pay | Admitting: Vascular Surgery

## 2014-06-02 ENCOUNTER — Encounter (HOSPITAL_COMMUNITY): Payer: Self-pay | Admitting: Internal Medicine

## 2014-06-02 DIAGNOSIS — F1990 Other psychoactive substance use, unspecified, uncomplicated: Secondary | ICD-10-CM | POA: Diagnosis present

## 2014-06-02 DIAGNOSIS — I480 Paroxysmal atrial fibrillation: Secondary | ICD-10-CM

## 2014-06-02 DIAGNOSIS — I721 Aneurysm of artery of upper extremity: Secondary | ICD-10-CM

## 2014-06-02 DIAGNOSIS — I482 Chronic atrial fibrillation: Secondary | ICD-10-CM

## 2014-06-02 DIAGNOSIS — L02413 Cutaneous abscess of right upper limb: Secondary | ICD-10-CM | POA: Diagnosis present

## 2014-06-02 DIAGNOSIS — L0390 Cellulitis, unspecified: Secondary | ICD-10-CM | POA: Diagnosis present

## 2014-06-02 DIAGNOSIS — J4520 Mild intermittent asthma, uncomplicated: Secondary | ICD-10-CM

## 2014-06-02 LAB — RAPID URINE DRUG SCREEN, HOSP PERFORMED
Amphetamines: NOT DETECTED
Barbiturates: NOT DETECTED
Benzodiazepines: NOT DETECTED
Cocaine: POSITIVE — AB
OPIATES: POSITIVE — AB
TETRAHYDROCANNABINOL: POSITIVE — AB

## 2014-06-02 LAB — CBC
HEMATOCRIT: 38.8 % (ref 36.0–46.0)
Hemoglobin: 12.7 g/dL (ref 12.0–15.0)
MCH: 28.8 pg (ref 26.0–34.0)
MCHC: 32.7 g/dL (ref 30.0–36.0)
MCV: 88 fL (ref 78.0–100.0)
Platelets: 184 10*3/uL (ref 150–400)
RBC: 4.41 MIL/uL (ref 3.87–5.11)
RDW: 14.6 % (ref 11.5–15.5)
WBC: 18 10*3/uL — ABNORMAL HIGH (ref 4.0–10.5)

## 2014-06-02 LAB — COMPREHENSIVE METABOLIC PANEL
ALT: 43 U/L — ABNORMAL HIGH (ref 0–35)
ANION GAP: 7 (ref 5–15)
AST: 55 U/L — ABNORMAL HIGH (ref 0–37)
Albumin: 3.4 g/dL — ABNORMAL LOW (ref 3.5–5.2)
Alkaline Phosphatase: 127 U/L — ABNORMAL HIGH (ref 39–117)
BUN: 10 mg/dL (ref 6–23)
CO2: 26 mmol/L (ref 19–32)
Calcium: 8.5 mg/dL (ref 8.4–10.5)
Chloride: 104 mEq/L (ref 96–112)
Creatinine, Ser: 0.86 mg/dL (ref 0.50–1.10)
GFR calc Af Amer: >90 mL/min (ref >90)
GLUCOSE: 102 mg/dL — AB (ref 70–99)
Potassium: 3.8 mmol/L (ref 3.5–5.1)
Sodium: 137 mmol/L (ref 135–145)
Total Bilirubin: 1.1 mg/dL (ref 0.3–1.2)
Total Protein: 6.9 g/dL (ref 6.0–8.3)

## 2014-06-02 LAB — PHOSPHORUS: PHOSPHORUS: 3.7 mg/dL (ref 2.3–4.6)

## 2014-06-02 LAB — MRSA PCR SCREENING: MRSA by PCR: NEGATIVE

## 2014-06-02 LAB — URINE MICROSCOPIC-ADD ON

## 2014-06-02 LAB — HEPATITIS C ANTIBODY: HCV Ab: REACTIVE — AB

## 2014-06-02 LAB — MAGNESIUM: Magnesium: 1.8 mg/dL (ref 1.5–2.5)

## 2014-06-02 LAB — URINALYSIS, ROUTINE W REFLEX MICROSCOPIC
Bilirubin Urine: NEGATIVE
GLUCOSE, UA: NEGATIVE mg/dL
Hgb urine dipstick: NEGATIVE
KETONES UR: NEGATIVE mg/dL
Nitrite: POSITIVE — AB
Protein, ur: NEGATIVE mg/dL
Specific Gravity, Urine: 1.011 (ref 1.005–1.030)
Urobilinogen, UA: 0.2 mg/dL (ref 0.0–1.0)
pH: 5.5 (ref 5.0–8.0)

## 2014-06-02 LAB — HIV ANTIBODY (ROUTINE TESTING W REFLEX): HIV 1&2 Ab, 4th Generation: NONREACTIVE

## 2014-06-02 LAB — TSH: TSH: 0.531 u[IU]/mL (ref 0.350–4.500)

## 2014-06-02 MED ORDER — ONDANSETRON HCL 4 MG PO TABS: 4.0000 mg | ORAL_TABLET | Freq: Four times a day (QID) | ORAL | Status: DC | PRN

## 2014-06-02 MED ORDER — CETYLPYRIDINIUM CHLORIDE 0.05 % MT LIQD
7.0000 mL | Freq: Two times a day (BID) | OROMUCOSAL | Status: DC
  Administered 2014-06-02: 7 mL via OROMUCOSAL

## 2014-06-02 MED ORDER — PIPERACILLIN-TAZOBACTAM 3.375 G IVPB
3.3750 g | Freq: Three times a day (TID) | INTRAVENOUS | Status: DC
  Administered 2014-06-02 (×2): 3.375 g via INTRAVENOUS
  Filled 2014-06-02 (×4): qty 50

## 2014-06-02 MED ORDER — IOHEXOL 350 MG/ML SOLN
100.0000 mL | Freq: Once | INTRAVENOUS | Status: AC | PRN
  Administered 2014-06-02: 100 mL via INTRAVENOUS

## 2014-06-02 MED ORDER — TRAMADOL HCL 50 MG PO TABS: 50.0000 mg | ORAL_TABLET | Freq: Four times a day (QID) | ORAL | Status: DC | PRN

## 2014-06-02 MED ORDER — NICOTINE 21 MG/24HR TD PT24
21.0000 mg | MEDICATED_PATCH | Freq: Every day | TRANSDERMAL | Status: DC
  Filled 2014-06-02: qty 1

## 2014-06-02 MED ORDER — MOMETASONE FURO-FORMOTEROL FUM 100-5 MCG/ACT IN AERO
2.0000 | INHALATION_SPRAY | Freq: Two times a day (BID) | RESPIRATORY_TRACT | Status: DC
  Administered 2014-06-02: 2 via RESPIRATORY_TRACT
  Filled 2014-06-02: qty 8.8

## 2014-06-02 MED ORDER — ALBUTEROL SULFATE (2.5 MG/3ML) 0.083% IN NEBU: 3.0000 mL | INHALATION_SOLUTION | Freq: Four times a day (QID) | RESPIRATORY_TRACT | Status: DC | PRN

## 2014-06-02 MED ORDER — VANCOMYCIN HCL IN DEXTROSE 750-5 MG/150ML-% IV SOLN
750.0000 mg | Freq: Two times a day (BID) | INTRAVENOUS | Status: DC
  Administered 2014-06-02: 750 mg via INTRAVENOUS
  Filled 2014-06-02 (×2): qty 150

## 2014-06-02 MED ORDER — ACETAMINOPHEN 325 MG PO TABS: 650.0000 mg | ORAL_TABLET | Freq: Four times a day (QID) | ORAL | Status: DC | PRN

## 2014-06-02 MED ORDER — ENOXAPARIN SODIUM 40 MG/0.4ML ~~LOC~~ SOLN
40.0000 mg | SUBCUTANEOUS | Status: DC
  Administered 2014-06-02: 40 mg via SUBCUTANEOUS
  Filled 2014-06-02: qty 0.4

## 2014-06-02 MED ORDER — SODIUM CHLORIDE 0.9 % IJ SOLN
3.0000 mL | Freq: Two times a day (BID) | INTRAMUSCULAR | Status: DC
  Administered 2014-06-02 (×2): 3 mL via INTRAVENOUS

## 2014-06-02 MED ORDER — INFLUENZA VAC SPLIT QUAD 0.5 ML IM SUSY: 0.5000 mL | PREFILLED_SYRINGE | INTRAMUSCULAR | Status: DC

## 2014-06-02 MED ORDER — CLONAZEPAM 0.5 MG PO TABS: 0.5000 mg | ORAL_TABLET | Freq: Three times a day (TID) | ORAL | Status: DC | PRN

## 2014-06-02 MED ORDER — DOCUSATE SODIUM 100 MG PO CAPS: 100.0000 mg | ORAL_CAPSULE | Freq: Two times a day (BID) | ORAL | Status: DC

## 2014-06-02 MED ORDER — SODIUM CHLORIDE 0.9 % IV SOLN
INTRAVENOUS | Status: DC
  Administered 2014-06-02: 13:00:00 via INTRAVENOUS

## 2014-06-02 MED ORDER — ONDANSETRON HCL 4 MG/2ML IJ SOLN: 4.0000 mg | Freq: Four times a day (QID) | INTRAMUSCULAR | Status: DC | PRN

## 2014-06-02 MED ORDER — ACETAMINOPHEN 650 MG RE SUPP: 650.0000 mg | Freq: Four times a day (QID) | RECTAL | Status: DC | PRN

## 2014-06-02 MED ORDER — PNEUMOCOCCAL VAC POLYVALENT 25 MCG/0.5ML IJ INJ: 0.5000 mL | INJECTION | INTRAMUSCULAR | Status: DC

## 2014-06-02 MED ORDER — DILTIAZEM HCL ER COATED BEADS 120 MG PO CP24
120.0000 mg | ORAL_CAPSULE | Freq: Every day | ORAL | Status: DC
  Administered 2014-06-02: 120 mg via ORAL
  Filled 2014-06-02: qty 1

## 2014-06-02 MED ORDER — CLONAZEPAM 0.5 MG PO TABS
0.5000 mg | ORAL_TABLET | Freq: Two times a day (BID) | ORAL | Status: DC | PRN
  Administered 2014-06-02: 0.5 mg via ORAL
  Filled 2014-06-02: qty 1

## 2014-06-02 MED ORDER — SODIUM CHLORIDE 0.9 % IV SOLN
INTRAVENOUS | Status: DC
  Administered 2014-06-02: 03:00:00 via INTRAVENOUS

## 2014-06-02 NOTE — ED Notes (Signed)
Dr. Doutova at bedside.  

## 2014-06-02 NOTE — Progress Notes (Signed)
Pt unhooked IV and removed telemetry and wants to sign out AMA.

## 2014-06-02 NOTE — Progress Notes (Signed)
ANTIBIOTIC CONSULT NOTE - INITIAL  Pharmacy Consult for Vancomycin Indication: abcess  No Known Allergies  Patient Measurements:    Vital Signs: Temp: 98.2 F (36.8 C) (12/30 0306) Temp Source: Oral (12/30 0306) BP: 93/53 mmHg (12/30 0306) Pulse Rate: 74 (12/30 0245) Intake/Output from previous day: 12/29 0701 - 12/30 0700 In: 50 [IV Piggyback:50] Out: -  Intake/Output from this shift: Total I/O In: 50 [IV Piggyback:50] Out: -   Labs:  Recent Labs  06/01/14 2206  WBC 14.7*  HGB 13.4  PLT 176  CREATININE 0.98   Estimated Creatinine Clearance: 74.1 mL/min (by C-G formula based on Cr of 0.98). No results for input(s): VANCOTROUGH, VANCOPEAK, VANCORANDOM, GENTTROUGH, GENTPEAK, GENTRANDOM, TOBRATROUGH, TOBRAPEAK, TOBRARND, AMIKACINPEAK, AMIKACINTROU, AMIKACIN in the last 72 hours.   Microbiology: No results found for this or any previous visit (from the past 720 hour(s)).  Medical History: Past Medical History  Diagnosis Date  . Asthma   . Atrial fibrillation     LV function lower limit of normal   . GERD (gastroesophageal reflux disease)   . Depression   . Polysubstance dependence including opioid type drug, continuous use     prior heroin overdose  . Left against medical advice 12/12/13  . Bipolar disorder     Medications:  Prescriptions prior to admission  Medication Sig Dispense Refill Last Dose  . albuterol (PROVENTIL HFA;VENTOLIN HFA) 108 (90 BASE) MCG/ACT inhaler Inhale 1-2 puffs into the lungs every 6 (six) hours as needed for wheezing or shortness of breath.   Past Month at Unknown time  . clonazePAM (KLONOPIN) 0.5 MG tablet Take 0.5 mg by mouth 2 (two) times daily as needed for anxiety.   Past Month at Unknown time  . diltiazem (CARDIZEM CD) 120 MG 24 hr capsule Take 120 mg by mouth daily.   06/01/2014 at Unknown time  . Fluticasone-Salmeterol (ADVAIR) 250-50 MCG/DOSE AEPB Inhale 1 puff into the lungs 2 (two) times daily.   Past Month at Unknown time   . ipratropium-albuterol (DUONEB) 0.5-2.5 (3) MG/3ML SOLN Take 3 mLs by nebulization 3 (three) times daily as needed (wheezing and shortness of breath). Pharmacy may substitute for generic equivalents   Past Week at Unknown time  . ciprofloxacin (CIPRO) 500 MG tablet Take 1 tablet (500 mg total) by mouth 2 (two) times daily. (Patient not taking: Reported on 06/01/2014) 28 tablet 0 Completed Course at Unknown time  . ondansetron (ZOFRAN) 4 MG tablet Take 1 tablet (4 mg total) by mouth every 6 (six) hours. (Patient not taking: Reported on 06/01/2014) 12 tablet 0 Not Taking at Unknown time   Assessment: 28 yo female with RUE abcess for empiric antibiotics.  Vancomycin 1 g IV given in ED at 0100 Goal of Therapy:  Vancomycin trough level 10-15 mcg/ml  Plan:  Vancomycin 750 mg IV q12h  Eddie CandleAbbott, Auriah Hollings [place] 06/02/2014,3:33 AM

## 2014-06-02 NOTE — Telephone Encounter (Signed)
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  F/U with Dr. Imogene Burnhen in 4 weeks

## 2014-06-02 NOTE — Telephone Encounter (Signed)
Left msg for pt re appt information, dpm

## 2014-06-02 NOTE — H&P (Signed)
PCP:    Chief Complaint:  Fast heart rate   HPI: [name] is a 28 y.o. female   has a past medical history of Asthma; Atrial fibrillation; GERD (gastroesophageal reflux disease); Depression; Polysubstance dependence including opioid type drug, continuous use; Left against medical advice (12/12/13); and Bipolar disorder.   Presented with  Patient is An IV drug user last use was 4 weeks ago. Patient reports having a "knot" in [facility] area of right arm for the past 2 years. She was told to put a worm compress on it. Patient was in the waiting room of ER yesterday when the abscess has spontaneously burst. She left to home without ever been seen. Her family was worried about her and she started to feel that her heart is beating too fast she came to get checked out. She reports a fever today up to 103. On arrival to ER HR was 123 appeared to be sinus with PAC's.   In ER was noted for [facility] fossa abscess worrisome for AV fistula formation, ER discussed with Radiology who recommended CT angio of the arm in AM  Hospitalist was called for admission for  Right AC fossa abscess.   Review of Systems:    Pertinent positives include:  Fevers, chills,  Constitutional:  No weight loss, night sweats, fatigue, weight loss  HEENT:  No headaches, Difficulty swallowing,Tooth/dental problems,Sore throat,  No sneezing, itching, ear ache, nasal congestion, post nasal drip,  Cardio-vascular:  No chest pain, Orthopnea, PND, anasarca, dizziness, palpitations.no Bilateral lower extremity swelling  GI:  No heartburn, indigestion, abdominal pain, nausea, vomiting, diarrhea, change in bowel habits, loss of appetite, melena, blood in stool, hematemesis Resp:  no shortness of breath at rest. No dyspnea on exertion, No excess mucus, no productive cough, No non-productive cough, No coughing up of blood.No change in color of mucus.No wheezing. Skin:  no rash or lesions. No jaundice GU:  no dysuria, change in  color of urine, no urgency or frequency. No straining to urinate.  No flank pain.  Musculoskeletal:  No joint pain or no joint swelling. No decreased range of motion. No back pain.  Psych:  No change in mood or affect. No depression or anxiety. No memory loss.  Neuro: no localizing neurological complaints, no tingling, no weakness, no double vision, no gait abnormality, no slurred speech, no confusion  Otherwise ROS are negative except for above, 10 systems were reviewed  Past Medical History: Past Medical History  Diagnosis Date  . Asthma   . Atrial fibrillation     LV function lower limit of normal   . GERD (gastroesophageal reflux disease)   . Depression   . Polysubstance dependence including opioid type drug, continuous use     prior heroin overdose  . Left against medical advice 12/12/13  . Bipolar disorder    Past Surgical History  Procedure Laterality Date  . Catheter ablation      she reports prior ablatio (More than 5 years ago) by Dr Rudolpho Sevin in [facility]     Medications: Prior to Admission medications   Medication Sig Start Date End Date Taking? Authorizing Provider  albuterol (PROVENTIL HFA;VENTOLIN HFA) 108 (90 BASE) MCG/ACT inhaler Inhale 1-2 puffs into the lungs every 6 (six) hours as needed for wheezing or shortness of breath.   Yes Historical Provider, MD  clonazePAM (KLONOPIN) 0.5 MG tablet Take 0.5 mg by mouth 2 (two) times daily as needed for anxiety.   Yes Historical Provider, MD  diltiazem (  CARDIZEM CD) 120 MG 24 hr capsule Take 120 mg by mouth daily.   Yes Historical Provider, MD  Fluticasone-Salmeterol (ADVAIR) 250-50 MCG/DOSE AEPB Inhale 1 puff into the lungs 2 (two) times daily.   Yes Historical Provider, MD  ipratropium-albuterol (DUONEB) 0.5-2.5 (3) MG/3ML SOLN Take 3 mLs by nebulization 3 (three) times daily as needed (wheezing and shortness of breath). Pharmacy may substitute for generic equivalents 04/29/12  Yes Clanford Cyndie MullL Johnson, MD  ciprofloxacin  (CIPRO) 500 MG tablet Take 1 tablet (500 mg total) by mouth 2 (two) times daily. Patient not taking: Reported on 06/01/2014 02/08/14   Louann SjogrenVictoria L Creech, PA-C  ondansetron (ZOFRAN) 4 MG tablet Take 1 tablet (4 mg total) by mouth every 6 (six) hours. Patient not taking: Reported on 06/01/2014 02/08/14   Louann SjogrenVictoria L Creech, PA-C    Allergies:  No Known Allergies  Social History:  Ambulatory   independently   Lives at home alone      reports that she has been smoking Cigarettes.  She has been smoking about 0.50 packs per day. She does not have any smokeless tobacco history on file. She reports that she does not drink alcohol or use illicit drugs.    Family History: family history includes Asthma in her brother; Hypertension in her maternal grandmother and mother.    Physical Exam: Patient Vitals for the past 24 hrs:  BP Temp Pulse Resp SpO2  06/02/14 0045 98/57 mmHg - 88 15 100 %  06/02/14 0000 104/70 mmHg - 101 16 100 %  06/01/14 2350 104/57 mmHg - 93 18 97 %  06/01/14 2300 102/70 mmHg - 117 18 100 %  06/01/14 2230 112/70 mmHg - (!) 124 14 98 %  06/01/14 2156 (!) 98/51 mmHg 98.7 F (37.1 C) 62 - 96 %    1. General:  in No Acute distress 2. Psychological: Alert and   Oriented 3. Head/ENT:   Moist   Mucous Membranes                          Head Non traumatic, neck supple                          Normal   Dentition 4. SKIN:   decreased Skin turgor,  Skin clean Dry, swelling noted over right antecubital fossa with a area worrisome for thrill 5. Heart: Regular rate and rhythm no Murmur, Rub or gallop 6. Lungs: Clear to auscultation bilaterally, no wheezes or crackles   7. Abdomen: Soft, non-tender, Non distended 8. Lower extremities: no clubbing, cyanosis, or edema 9. Neurologically Grossly intact, moving all 4 extremities equally 10. MSK: Normal range of motion  body mass index is unknown because there is no weight on file.   Labs on Admission:   Results for orders placed or  performed during the hospital encounter of 06/01/14 (from the past 24 hour(s))  CBC     Status: Abnormal   Collection Time: 06/01/14 10:06 PM  Result Value Ref Range   WBC 14.7 (H) 4.0 - 10.5 K/uL   RBC 4.60 3.87 - 5.11 MIL/uL   Hemoglobin 13.4 12.0 - 15.0 g/dL   HCT 91.439.9 78.236.0 - 95.646.0 %   MCV 86.7 78.0 - 100.0 fL   MCH 29.1 26.0 - 34.0 pg   MCHC 33.6 30.0 - 36.0 g/dL   RDW 21.314.3 08.611.5 - 57.815.5 %   Platelets 176 150 - 400 K/uL  Basic metabolic panel     Status: Abnormal   Collection Time: 06/01/14 10:06 PM  Result Value Ref Range   Sodium 134 (L) 135 - 145 mmol/L   Potassium 3.7 3.5 - 5.1 mmol/L   Chloride 103 96 - 112 mEq/L   CO2 24 19 - 32 mmol/L   Glucose, Bld 112 (H) 70 - 99 mg/dL   BUN 11 6 - 23 mg/dL   Creatinine, Ser 6.210.98 0.50 - 1.10 mg/dL   Calcium 8.9 8.4 - 30.810.5 mg/dL   GFR calc non Af Amer 78 (L) >90 mL/min   GFR calc Af Amer >90 >90 mL/min   Anion gap 7 5 - 15  I-stat troponin, ED ([facility])     Status: None   Collection Time: 06/01/14 10:18 PM  Result Value Ref Range   Troponin i, poc 0.00 0.00 - 0.08 ng/mL   Comment 3            UA not obtained will order  No results found for: HGBA1C  Estimated Creatinine Clearance: 74.1 mL/min (by C-G formula based on Cr of 0.98).  BNP (last 3 results) No results for input(s): PROBNP in the last 8760 hours.  Other results:  I have pearsonaly reviewed this: ECG REPORT  Rate: 148  Rhythm: ST with PAC ST&T Change: no ischemia   There were no vitals filed for this visit.   Cultures:    Component Value Date/Time   SDES BLOOD HAND RIGHT 02/08/2014 1925   SPECREQUEST BOTTLES DRAWN AEROBIC AND ANAEROBIC B 5CC R 4CC 02/08/2014 1925   CULT  02/08/2014 1925    NO GROWTH 5 DAYS [facility]   REPTSTATUS 02/15/2014 FINAL 02/08/2014 1925     Radiological Exams on Admission: Dg Chest 2 View  06/01/2014   CLINICAL DATA:  Chest pain  EXAM: CHEST  2 VIEW  COMPARISON:  01/26/2014  FINDINGS: Normal  heart size and mediastinal contours. No acute infiltrate or edema. No effusion or pneumothorax. No acute osseous findings. Bilateral breast implants.  IMPRESSION: No active cardiopulmonary disease.   Electronically Signed   By: Tiburcio PeaJonathan  Watts M.D.   On: 06/01/2014 22:27    Chart has been reviewed  Assessment/Plan  28 yo F w hx of IV drug use here with AC fossa abscess on the right with questionable  AV fistula formation  Present on Admission:  . Abscess of right arm - CT angio of the upper ext in AM, broad spectrum antibiotics . Atrial fibrillation - chronic, currently appears in sinus, cont crdizem . IV drug user - obtain ECHO to eval for vegitations, and HIV, hep C . Asthma - albuterol PRN, stable    Prophylaxis:  Lovenox,    CODE STATUS:  FULL CODE    Other plan as per orders.  I have spent a total of 55 min on this admission  Montravious Weigelt 06/02/2014, 1:34 AM  [facility]  Pager [phone]   after 2 AM please page floor coverage PA If 7AM-7PM, please contact the day team taking care of the patient  [email or web address]  Password [email or web address]

## 2014-06-02 NOTE — Progress Notes (Signed)
Went to update patient and patient was gone. Note was left that said " Please see if MD will call in antibiotics". Regalado paged.

## 2014-06-02 NOTE — Progress Notes (Signed)
No answer on patient cell and mailbox is full. No answer on mothers cell as well.

## 2014-06-02 NOTE — Consult Note (Signed)
CONSULT NOTE   MRN : [id number]  Reason for Consult: right antecubital pseudoaneurysm  Referring Physician: Therisa Doyne, MD  History of Present Illness: 28 y/o female w/ history multisubstance abuse who reported to the ED with complaint of a "knot" on her right arm  antecubital area.  She states it has been there for over 1 year.  She was placing hot packs and topical medication on it to help it go away. She notes the knot spontaneously drained recently, releasing a combination of pus and clot.  Since then her arm "looks" better and feels better.  It has not bled or drained anymore since the initial drainage.  She notes history of injecting into what is her right basilic vein.    Other past medical history includes: Asthma; Atrial fibrillation; GERD (gastroesophageal reflux disease); Depression; Polysubstance dependence and bipolar disorder.    Current Facility-Administered Medications  Medication Dose Route Frequency Provider Last Rate Last Dose  . 0.9 %  sodium chloride infusion   Intravenous Continuous Therisa Doyne, MD 100 mL/hr at 06/02/14 0329    . acetaminophen (TYLENOL) tablet 650 mg  650 mg Oral Q6H PRN Therisa Doyne, MD       Or  . acetaminophen (TYLENOL) suppository 650 mg  650 mg Rectal Q6H PRN Therisa Doyne, MD      . albuterol (PROVENTIL) (2.5 MG/3ML) 0.083% nebulizer solution 3 mL  3 mL Inhalation Q6H PRN Therisa Doyne, MD      . antiseptic oral rinse (CPC / CETYLPYRIDINIUM CHLORIDE 0.05%) solution 7 mL  7 mL Mouth Rinse BID Therisa Doyne, MD   7 mL at 06/02/14 1000  . clonazePAM (KLONOPIN) tablet 0.5 mg  0.5 mg Oral BID PRN Therisa Doyne, MD   0.5 mg at 06/02/14 1042  . diltiazem (CARDIZEM CD) 24 hr capsule 120 mg  120 mg Oral Daily Therisa Doyne, MD   120 mg at 06/02/14 1034  . docusate sodium (COLACE) capsule 100 mg  100 mg Oral BID Therisa Doyne, MD   100 mg at 06/02/14 1000  . enoxaparin (LOVENOX) injection 40 mg  40 mg  Subcutaneous Q24H Therisa Doyne, MD   40 mg at 06/02/14 1034  . [START ON 06/03/2014] Influenza vac split quadrivalent PF (FLUARIX) injection 0.5 mL  0.5 mL Intramuscular Tomorrow-1000 Therisa Doyne, MD      . mometasone-formoterol (DULERA) 100-5 MCG/ACT inhaler 2 puff  2 puff Inhalation BID Therisa Doyne, MD   2 puff at 06/02/14 0731  . ondansetron (ZOFRAN) tablet 4 mg  4 mg Oral Q6H PRN Therisa Doyne, MD       Or  . ondansetron (ZOFRAN) injection 4 mg  4 mg Intravenous Q6H PRN Therisa Doyne, MD      . piperacillin-tazobactam (ZOSYN) IVPB 3.375 g  3.375 g Intravenous 3 times per day Therisa Doyne, MD   3.375 g at 06/02/14 0552  . [START ON 06/03/2014] pneumococcal 23 valent vaccine (PNU-IMMUNE) injection 0.5 mL  0.5 mL Intramuscular Tomorrow-1000 Anastassia Doutova, MD      . sodium chloride 0.9 % injection 3 mL  3 mL Intravenous Q12H Therisa Doyne, MD   3 mL at 06/02/14 1035  . traMADol (ULTRAM) tablet 50 mg  50 mg Oral Q6H PRN Therisa Doyne, MD      . vancomycin (VANCOCIN) IVPB 750 mg/150 ml premix  750 mg Intravenous Q12H Therisa Doyne, MD   750 mg at 06/02/14 1035    Pt meds include: Statin :No Betablocker: No ASA: No Other  anticoagulants/antiplatelets: none  Past Medical History  Diagnosis Date  . Asthma   . Atrial fibrillation     LV function lower limit of normal   . GERD (gastroesophageal reflux disease)   . Depression   . Polysubstance dependence including opioid type drug, continuous use     prior heroin overdose  . Left against medical advice 12/12/13  . Bipolar disorder     Past Surgical History  Procedure Laterality Date  . Catheter ablation      she reports prior ablatio (More than 5 years ago) by [facility]    Social History History  Substance Use Topics  . Smoking status: Current Every Day Smoker -- 0.50 packs/day    Types: Cigarettes  . Smokeless tobacco: Not on file     Comment: since 2002; smokes  about 8 cig/day   . Alcohol Use: Yes     Comment: heavy on occasion    Family History Family History  Problem Relation Age of Onset  . Asthma Brother   . Hypertension Mother   . Hypertension Maternal Grandmother     No Known Allergies   REVIEW OF SYSTEMS  General: [ ]  Weight loss, [ ]  Fever, [ ]  chills Neurologic: [ ]  Dizziness, [ ]  Blackouts, [ ]  Seizure, [ ]  Stroke, [ ]  "Mini stroke", [ ]  Slurred speech, [ ]  Temporary blindness; [ ]  weakness in arms or legs, [ ]  Hoarseness [ ]  Dysphagia Cardiac: [ ]  Chest pain/pressure, [ ]  Shortness of breath at rest [ ]  Shortness of breath with exertion, [x]  Atrial fibrillation or irregular heartbeat  Vascular: [ ]  Pain in legs with walking, [ ]  Pain in legs at rest, [ ]  Pain in legs at night,  [ ]  Non-healing ulcer, [ ]  Blood clot in vein/DVT,   Pulmonary: [ ]  Home oxygen, [ ]  Productive cough, [ ]  Coughing up blood, [ ]  Asthma, [ ]  Wheezing [ ]  COPD Musculoskeletal:  [ ]  Arthritis, [ ]  Low back pain, [ ]  Joint pain Hematologic: [ ]  Easy Bruising, [ ]  Anemia; [ ]  Hepatitis Gastrointestinal: [ ]  Blood in stool, [ ]  Gastroesophageal Reflux/heartburn, Urinary: [ ]  chronic Kidney disease, [ ]  on HD - [ ]  MWF or [ ]  TTHS, [ ]  Burning with urination, [ ]  Difficulty urinating Skin: [ ]  Rashes, [x]  Wounds, [x]  tatoos Psychological: [ ]  Anxiety, [x]  Depression   Physical Examination Filed Vitals:   06/02/14 0245 06/02/14 0306 06/02/14 0330 06/02/14 1034  BP: 97/59 93/53  95/60  Pulse: 74     Temp:  98.2 F (36.8 C)    TempSrc:  Oral    Resp: 11 13    Height:   5\' 7"  (1.702 m)   Weight:   117 lb 4.6 oz (53.2 kg)   SpO2: 98% 96%     Body mass index is 18.37 kg/(m^2).  General:  WDWN in NAD  Gait: Normal  Neck: no nuchal rigidity, no cervical LAD  HENT: WNL  Eyes: Pupils equal  Pulmonary: normal non-labored breathing , without Rales, rhonchi,  wheezing  Cardiac: RRR, occasional extrabeat, without  Murmurs, rubs or gallops; No  carotid bruits  Abdomen: soft, NT, no masses  Skin: no rashes, ulcers noted;  no Gangrene , no cellulitis; no open wounds; tattoos evident  Vascular Exam/Pulses:Palpable radial, brachial, DP/PT bilaterally.  Right antecubitum: pulsatile area lateral to the brachial artery.  No drainage or expressible fluid from the superficial opening in the arm.  Healed prior  injection sites overlying basilic vein  Musculoskeletal: no muscle wasting or atrophy; no edema   Neurologic: A&O X 3; Appropriate Affect ; SENSATION: normal;MOTOR FUNCTION: 5/5 Symmetric; Speech is fluent/normal  Psychiatric: Judgment intact, Mood & affect appropriate for pt's clinical situation  Lymph : No Cervical or Inguinal lymphadenopathy , R axillary lymphadenopathy   Significant Diagnostic Studies: CBC Lab Results  Component Value Date   WBC 18.0* 06/02/2014   HGB 12.7 06/02/2014   HCT 38.8 06/02/2014   MCV 88.0 06/02/2014   PLT 184 06/02/2014    BMET    Component Value Date/Time   NA 137 06/02/2014 0536   K 3.8 06/02/2014 0536   CL 104 06/02/2014 0536   CO2 26 06/02/2014 0536   GLUCOSE 102* 06/02/2014 0536   BUN 10 06/02/2014 0536   CREATININE 0.86 06/02/2014 0536   CALCIUM 8.5 06/02/2014 0536   GFRNONAA >90 06/02/2014 0536   GFRAA >90 06/02/2014 0536   Estimated Creatinine Clearance: 82.5 mL/min (by C-G formula based on Cr of 0.86).  COAG Lab Results  Component Value Date   INR 1.11 10/19/2012   INR 1.3 04/20/2008   INR 1.2 04/18/2008     Radiology: Dg Chest 2 View  06/01/2014   CLINICAL DATA:  Chest pain  EXAM: CHEST  2 VIEW  COMPARISON:  01/26/2014  FINDINGS: Normal heart size and mediastinal contours. No acute infiltrate or edema. No effusion or pneumothorax. No acute osseous findings. Bilateral breast implants.  IMPRESSION: No active cardiopulmonary disease.   Electronically Signed   By: Tiburcio Pea M.D.   On: 06/01/2014 22:27   Ct Angio Up Extrem Right W/cm &/or Wo/cm  06/02/2014    CLINICAL DATA:  IV drug user last use was 4 weeks ago. Patient reports having a "knot" in [facility] area of right arm for the past 2 years. She was told to put a worm compress on it. Patient was in the waiting room of ER yesterday when the abscess has spontaneously burst. She left to home without ever been seen. Her family was worried about her and she started to feel that her heart is beating too fast she came to get checked out. She reports a fever today up to 103. On arrival to ER HR was 123 appeared to be sinus with PAC's. In ER was noted for [facility] fossa abscess worrisome for AV fistula formation  EXAM: CT ANGIOGRAPHY UPPER RIGHT EXTREMITY  TECHNIQUE: Helical CT of the right upper extremity after IV contrast administration, arterial and venous phases. Coronal and sagittal reconstructions generated.  CONTRAST:  OMNIPAQUE IOHEXOL 350 MG/ML SOLN  COMPARISON:  12/06/2011 and new earlier studies  FINDINGS: SVC patent. RV/LV ratio less than 1. Satisfactory opacification of pulmonary arteries noted, and there is no evidence of pulmonary emboli in visualized segments. Patent superior and inferior pulmonary veins bilaterally. Adequate contrast opacification of the thoracic aorta with no evidence of dissection, aneurysm, or stenosis. There is classic 3-vessel brachiocephalic arch anatomy without proximal stenosis.  Right subclavian, axillary, and brachial arteries unremarkable. A small muscular branch from the distal brachial artery supplies an AV fistula in the antecubital region. There is an irregular 9 mm contiguous enhancing component in the lateral subcutaneous tissues consistent with pseudoaneurysm. The AV fistula drains into the basilic vein which is dilated with early enhancement, patent centrally. The radial and ulnar arteries are incompletely opacified distally, appearing grossly unremarkable.  Celiac axis, SMA, renal arteries, and IMA are patent. Visualized iliac arterial system unremarkable.  Venous phase  demonstrates patency of the central venous structures of the thorax. Patent hepatic veins, portal vein, SMV, splenic vein, bilateral renal veins.  Bilateral breast implants. 7 mm noncalcified nodule in the right middle lobe image 52/series 6. 6 mm subpleural nodule, lateral basal segment right lower lobe image 73. 5 mm nodule in the anterior basal segment right lower lobe image 67. These were present on prior study of 06/15/2008, suggesting benign etiology. Subcarinal adenopathy measured up to 14 mm short axis diameter. No hilar adenopathy. No pleural or pericardial effusion. Visualized portions of abdomen and pelvis are unremarkable. Thoracic and lumbar spine unremarkable.  Review of the MIP images confirms the above findings.  IMPRESSION: 1. Right antecubital AV fistula between muscular branch of the distal brachial artery and basilic vein, with associated 9 mm pseudoaneurysm. 2. Scattered nonspecific pulmonary nodules, present since 06/15/2008 suggesting benign etiology. 3. Subcarinal adenopathy, possibly reactive but nonspecific.   Electronically Signed   By: Oley Balm M.D.   On: 06/02/2014 09:21    ASSESSMENT/PLAN:  1.  Polysubstance abuse 2.  R antecubital abscess 3.  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  PAF: s/p ablation, controlled with medications  The area appears to be healing.  It was described as infected with edema, decreased range of motion of there right elbow and erythema.  The signs and symptoms are decreasing.  She is currently on IV antibiotics.  We recommend that she let the area heal and follow up in 4 weeks.  If the area started bleeding she has been instructed to come to the ED immediately.    Clinton Gallant [facility] 06/02/2014 11:21 AM   Addendum  I have independently interviewed and examined the patient, and I agree with the physician assistant's findings.  I reviewed the CTA R arm: fast opacification of basilic vein suggestive of  brachiobasilic fistula but actual fistula is not readily evident, "pseudoaneurysm" might be a segment of the fistula, no obvious residual abscess or phlegmon.  From the patient's history, this "knot" is likely chronic in nature with likely recent seeding from an IV drug injection.  The "knot" appears to be an abscess that spontaneously drained, which is in the proximity of the dilated segment of the fistula.  Obviously, the history of spontaneously draining clot is worrisome but the patient has now gone multiple days without spontaneously hemorrhaging, so I doubt there is a frank rupture.  Clinically her sx are improving, consistent with her history of drainage of the abscess.  I do not recommend exploring the right arm at this point, rather I would let her heal up and manage the residual cellulitis with IV antibiotics.  In the acute phase, exploration of an antecubital phlegmon is likely to result in ligation of multiple vascular structures and possible nerve transection.  If this turns out to be mycotic pseudoaneurysm, sometimes venous replacement of infected arteries is needed.  In discussing this with the patient, she is not interested in pursuing surgical options at this time.  Extrapolating data from femoral pseudoaneurysms, small pseudoaneurysm not infrequently spontaneously thrombose, so future intervention might not be needed once she had healed from her infection.  If she develops any active bleeding, however, she will need to undergo exploration of the right antecubitum with likely ligation of feeder branch from brachial artery and possible ligation of basilic vein.  Obviously, counseling for illicit drug cessation would be essential to preventing a recurrence.  The patient can follow up with me in the office  in 1 month.     Leonides SakeBrian Chen, MD [facility] Office: [phone] Pager: [phone]  06/02/2014, 12:53 PM

## 2014-06-02 NOTE — Progress Notes (Signed)
[facility] PROGRESS NOTE  [name] ZOX:[id number] DOB: [birth year] DOA: 06/01/2014 PCP: No PCP Per Patient  Assessment/Plan:  Right arm pseudoaneurysm with AV fistula formation -Afebrile with leukocytosis-IV drug user, presented with abscess in R AC fossa  -CTA upper arm-R AC AV fistula with associated 9mm peseudoaneurysm -Vascular surgery following, rec's- continue current regimen of IV abx, follow up in 4 weeks -Continue IV vancomycin and Zosyn (day 2) -Blood cultures pending, pain management PRN  Urinary tract infection -UA- positive for nitrite, many bacteria -patient on IV Zosyn (day 2) -urine cultures pending  Chronic Hepatitis C -HCV ab reactive -HCV RNA pending  IV drug user -Counseled on cessation -2D echo to check for vegetation -HIV pending  History of atrial fibrillation -Rate controlled -Continue PO Cardizem  -Pt was also on Digoxin, but due to medical noncompliance has not been on it for over 4 months.  Chronic pulmonary nodules -Found incidentally on CT upper arm-consistent with those found in 2010 thus suggesting benign etiolgoy  Subcarinal adenopathy -found on CT upper arm.   Asthma Stable -Placed on Dulera BID   Anxiety Continue Klonopin PRN   DVT Prophylaxis SQ Lovenox Code Status: Full Family Communication: No family at bedside Disposition Plan: Inpatient   Consultants:  Vascular surgery  Procedures:  None  Antibiotics:  IV Vancomycin (day 2)  IV Zosyn (day 2)  HPI/Subjective: [name] is a 28 yo female with PMH of IV heroin drug use, Atria fibrillation on cardiazem, Asthma, GERD, Depression, Polysubstance dependence including opioid, Bipolar disorder, and left AMA (12/12/13) , that presented to the ED after an abscess in the right antecubital fossa has bursted.  She states that the swelling has been there for 2 years, but pain, and redness developed over the area two days ago.  Then area opened after  she used topical medications on it.  Patient is admitted for IV antibiotics and further workup to r/o fistula.    Denies any complaints.   Objective: Filed Vitals:   06/02/14 0306  BP: 93/53  Pulse:   Temp: 98.2 F (36.8 C)  Resp: 13    Intake/Output Summary (Last 24 hours) at 06/02/14 0754 Last data filed at 06/02/14 [id number]  Gross per 24 hour  Intake    615 ml  Output      0 ml  Net    615 ml   Filed Weights   06/02/14 0330  Weight: 53.2 kg (117 lb 4.6 oz)    Exam:  Gen: Alert Caucasian female in NAD. Sitting comfortably in bed HEENT: Normocephalic, atraumatic.  Mouth with bad dentition Chest: clear to auscultate bilaterally, no ronchi or rales  Cardiac: Regular rate and rhythm, S1-S2, no rubs murmurs or gallops  Abdomen: soft, non tender, non distended, +bowel sounds. No guarding or rigidity  Extremities: Symmetrical in appearance without cyanosis or edema.  Right AC fossa with pulsatile area of swelling, additionally, an open area of induration, not actively draining. Neurological: Alert awake oriented to time place and person.   Data Reviewed: Basic Metabolic Panel:  Recent Labs Lab 06/01/14 2206 06/02/14 0536  NA 134* 137  K 3.7 3.8  CL 103 104  CO2 24 26  GLUCOSE 112* 102*  BUN 11 10  CREATININE 0.98 0.86  CALCIUM 8.9 8.5  MG  --  1.8  PHOS  --  3.7   Liver Function Tests:  Recent Labs Lab 06/02/14 0536  AST 55*  ALT 43*  ALKPHOS 127*  BILITOT 1.1  PROT 6.9  ALBUMIN 3.4*   No results for input(s): LIPASE, AMYLASE in the last 168 hours. No results for input(s): AMMONIA in the last 168 hours. CBC:  Recent Labs Lab 06/01/14 2206 06/02/14 0536  WBC 14.7* 18.0*  HGB 13.4 12.7  HCT 39.9 38.8  MCV 86.7 88.0  PLT 176 184   Cardiac Enzymes: No results for input(s): CKTOTAL, CKMB, CKMBINDEX, TROPONINI in the last 168 hours. BNP (last 3 results) No results for input(s): PROBNP in the last 8760 hours. CBG: No results for input(s): GLUCAP in  the last 168 hours.  Recent Results (from the past 240 hour(s))  MRSA PCR Screening     Status: None   Collection Time: 06/02/14  4:17 AM  Result Value Ref Range Status   MRSA by PCR NEGATIVE NEGATIVE Final    Comment:        The GeneXpert MRSA Assay (FDA approved for NASAL specimens only), is one component of a comprehensive MRSA colonization surveillance program. It is not intended to diagnose MRSA infection nor to guide or monitor treatment for MRSA infections.      Studies: Dg Chest 2 View  06/01/2014   CLINICAL DATA:  Chest pain  EXAM: CHEST  2 VIEW  COMPARISON:  01/26/2014  FINDINGS: Normal heart size and mediastinal contours. No acute infiltrate or edema. No effusion or pneumothorax. No acute osseous findings. Bilateral breast implants.  IMPRESSION: No active cardiopulmonary disease.   Electronically Signed   By: Tiburcio PeaJonathan  Watts M.D.   On: 06/01/2014 22:27    Scheduled Meds: . antiseptic oral rinse  7 mL Mouth Rinse BID  . diltiazem  120 mg Oral Daily  . docusate sodium  100 mg Oral BID  . enoxaparin (LOVENOX) injection  40 mg Subcutaneous Q24H  . [START ON 06/03/2014] Influenza vac split quadrivalent PF  0.5 mL Intramuscular Tomorrow-1000  . mometasone-formoterol  2 puff Inhalation BID  . piperacillin-tazobactam (ZOSYN)  IV  3.375 g Intravenous 3 times per day  . [START ON 06/03/2014] pneumococcal 23 valent vaccine  0.5 mL Intramuscular Tomorrow-1000  . sodium chloride  3 mL Intravenous Q12H  . vancomycin  750 mg Intravenous Q12H   Continuous Infusions: . sodium chloride 100 mL/hr at 06/02/14 [id number]    Active Problems:   Atrial fibrillation   Asthma   Cellulitis   IV drug user   Abscess of right arm    Time spent: 45    Illa LevelOsman, [facility] LPA-C  [facility] Pager [phone]. If 7PM-7AM, please contact night-coverage at [email or web address], password [email or web address] 06/02/2014, 7:54 AM  LOS: 1 day     Patient seen and examined. Agree with PN done by [facility] PA-C.   Patient relates swelling has decrease, pain better.  Continue with IV antibiotics, vascular consulted. Follow blood culture. ECHO.  Follow WBC trend.   Sherley Leser, Md.

## 2014-06-02 NOTE — Progress Notes (Signed)
Utilization review complete. Rama Mcclintock RN CCM Case Mgmt phone [phone] 

## 2014-06-06 LAB — CULTURE, BLOOD (ROUTINE X 2)

## 2014-06-06 LAB — HCV RNA QUANT
HCV QUANT LOG: 5.06 {Log} — AB (ref <1.18)
HCV QUANT: 114709 [IU]/mL — AB (ref <15)

## 2014-06-08 LAB — CULTURE, BLOOD (ROUTINE X 2): Culture: NO GROWTH

## 2014-06-08 LAB — HCV RNA QUANT
HCV QUANT LOG: 5 {Log} — AB (ref <1.18)
HCV Quantitative: 99455 IU/mL — ABNORMAL HIGH (ref <15)

## 2014-06-09 LAB — HEPATITIS C GENOTYPE

## 2014-06-24 NOTE — Discharge Summary (Signed)
Physician Discharge Summary  [name] ZOX:[id number] DOB: 1986/05/03 DOA: 06/01/2014  PCP: No PCP Per Patient  Admit date: 06/01/2014 Discharge date: 06/24/2014  Time spent: 30 minutes  1. Patient left against medical advised.   Discharge Diagnoses:    Abscess of right arm   Atrial fibrillation   Asthma   Cellulitis   IV drug user      Filed Weights   06/02/14 0330  Weight: 53.2 kg (117 lb 4.6 oz)    History of present illness:   Expand All Collapse All     PCP:    Chief Complaint:  Fast heart rate   HPI: [name] is a 29 y.o. female   has a past medical history of Asthma; Atrial fibrillation; GERD (gastroesophageal reflux disease); Depression; Polysubstance dependence including opioid type drug, continuous use; Left against medical advice (12/12/13); and Bipolar disorder.   Presented with  Patient is An IV drug user last use was 4 weeks ago. Patient reports having a "knot" in [facility] area of right arm for the past 2 years. She was told to put a worm compress on it. Patient was in the waiting room of ER yesterday when the abscess has spontaneously burst. She left to home without ever been seen. Her family was worried about her and she started to feel that her heart is beating too fast she came to get checked out. She reports a fever today up to 103. On arrival to ER HR was 123 appeared to be sinus with PAC's.  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 She left the hospital without informing the nurse.   Urinary tract infection -UA- positive for nitrite, many bacteria -patient on IV Zosyn (day 2) -urine cultures pending  Chronic Hepatitis C -HCV ab reactive -HCV RNA pending  IV drug user -Counseled on cessation -2D echo to check for vegetation -HIV pending  History of atrial fibrillation -Rate controlled -Continue PO Cardizem  -Pt was also on Digoxin, but due to medical noncompliance has not been on it for over 4 months.  Chronic pulmonary nodules -Found incidentally on CT upper arm-consistent with those found in 2010 thus suggesting benign etiolgoy  Subcarinal adenopathy -found on CT upper arm.   Asthma Stable -Placed on Dulera BID  Procedures: CTA upper arm-R AC AV fistula with associated 9mm peseudoaneurysm  Consultations:  Dr Imogene Burn, vascular  Discharge Exam: Filed Vitals:   06/02/14 1034  BP: 95/60  Pulse:   Temp:   Resp:      Discharge Instructions    Discharge Medication List as of 06/02/2014  3:15 PM    CONTINUE these medications which have NOT CHANGED   Details  albuterol (PROVENTIL HFA;VENTOLIN HFA) 108 (90 BASE) MCG/ACT inhaler Inhale 1-2 puffs into the lungs every 6 (six) hours as needed for wheezing or shortness of breath., Until Discontinued, Historical Med    clonazePAM (KLONOPIN) 0.5 MG tablet Take 0.5 mg by mouth 2 (two) times  daily as needed for anxiety., Until Discontinued, Historical Med    diltiazem (CARDIZEM CD) 120 MG 24 hr capsule Take 120 mg by mouth daily., Until Discontinued, Historical Med    Fluticasone-Salmeterol (ADVAIR) 250-50 MCG/DOSE AEPB Inhale 1 puff into the lungs 2 (two) times daily., Until Discontinued, Historical Med    ipratropium-albuterol (DUONEB) 0.5-2.5 (3) MG/3ML SOLN Take 3 mLs by nebulization 3 (three) times daily as needed (wheezing and shortness of breath). Pharmacy may substitute for generic  equivalents, Starting 04/29/2012, Until Discontinued, Historical Med    ciprofloxacin (CIPRO) 500 MG tablet Take 1 tablet (500 mg total) by mouth 2 (two) times daily., Starting 02/08/2014, Until Discontinued, Print    ondansetron (ZOFRAN) 4 MG tablet Take 1 tablet (4 mg total) by mouth every 6 (six) hours., Starting 02/08/2014, Until Discontinued, Print       No Known Allergies Follow-up Information    Follow up with Nilda SimmerHEN,BRIAN LIANG-YU, MD In 4 weeks.   Specialty:  Vascular Surgery   Why:  sent messsage to office   Contact information:   [address] [place] [address] [phone]        The results of significant diagnostics from this hospitalization (including imaging, microbiology, ancillary and laboratory) are listed below for reference.    Significant Diagnostic Studies: Dg Chest 2 View  06/01/2014   CLINICAL DATA:  Chest pain  EXAM: CHEST  2 VIEW  COMPARISON:  01/26/2014  FINDINGS: Normal heart size and mediastinal contours. No acute infiltrate or edema. No effusion or pneumothorax. No acute osseous findings. Bilateral breast implants.  IMPRESSION: No active cardiopulmonary disease.   Electronically Signed   By: Tiburcio PeaJonathan  Watts M.D.   On: 06/01/2014 22:27   Ct Angio Up Extrem Right W/cm &/or Wo/cm  06/02/2014   CLINICAL DATA:  IV drug user last use was 4 weeks ago. Patient reports having a "knot" in [facility] area of right arm for the past 2 years. She was told to put a worm compress on it. Patient was in the waiting room of ER yesterday when the abscess has spontaneously burst. She left to home without ever been seen. Her family was worried about her and she started to feel that her heart is beating too fast she came to get checked out. She reports a fever today up to 103. On arrival to ER HR was 123 appeared to be sinus with PAC's. [facility] for [facility] fistula formation  EXAM: CT ANGIOGRAPHY UPPER RIGHT EXTREMITY  TECHNIQUE: Helical CT of the right  upper extremity after IV contrast administration, arterial and venous phases. Coronal and sagittal reconstructions generated.  CONTRAST:  100mL OMNIPAQUE IOHEXOL 350 MG/ML SOLN  COMPARISON:  12/06/2011 and new earlier studies  FINDINGS: SVC patent. RV/LV ratio less than 1. Satisfactory opacification of pulmonary arteries noted, and there is no evidence of pulmonary emboli in visualized segments. Patent superior and inferior pulmonary veins bilaterally. Adequate contrast opacification of the thoracic aorta with no evidence of dissection, aneurysm, or stenosis. There is classic 3-vessel brachiocephalic arch anatomy without proximal stenosis.  Right subclavian, axillary, and brachial arteries unremarkable. A small muscular branch from the distal brachial artery supplies an AV fistula in the antecubital region. There is an irregular 9 mm contiguous enhancing component in the lateral subcutaneous tissues consistent with pseudoaneurysm. The AV fistula drains into the basilic vein which is dilated with early enhancement, patent centrally. The radial and ulnar arteries are incompletely opacified distally, appearing grossly unremarkable.  Celiac axis, SMA, renal arteries, and IMA are patent. Visualized iliac arterial system unremarkable.  Venous phase demonstrates patency of the central venous structures of the thorax. Patent hepatic veins, portal vein, SMV, splenic vein, bilateral renal veins.  Bilateral breast implants. 7 mm noncalcified nodule in the right middle lobe image 52/series 6. 6 mm subpleural nodule, lateral basal segment right lower lobe image 73. 5 mm nodule in the anterior basal segment right lower lobe image 67. These were present on prior study of 06/15/2008, suggesting benign etiology. Subcarinal adenopathy measured up to 14 mm short axis diameter. No hilar adenopathy. No pleural or pericardial effusion. Visualized portions of abdomen and pelvis are unremarkable. Thoracic and lumbar spine unremarkable.   Review of the MIP images confirms the above findings.  IMPRESSION: 1. Right antecubital AV fistula between muscular branch of the distal brachial artery and basilic vein, with associated 9 mm pseudoaneurysm. 2. Scattered nonspecific pulmonary nodules, present since 06/15/2008 suggesting benign etiology. 3. Subcarinal adenopathy, possibly reactive but nonspecific.   Electronically Signed   By: Oley Balm M.D.   On: 06/02/2014 09:21    Microbiology: No results found for this or any previous visit (from the past 240 hour(s)).   Labs: Basic Metabolic Panel: No results for input(s): NA, K, CL, CO2, GLUCOSE, BUN, CREATININE, CALCIUM, MG, PHOS in the last 168 hours. Liver Function Tests: No results for input(s): AST, ALT, ALKPHOS, BILITOT, PROT, ALBUMIN in the last 168 hours. No results for input(s): LIPASE, AMYLASE in the last 168 hours. No results for input(s): AMMONIA in the last 168 hours. CBC: No results for input(s): WBC, NEUTROABS, HGB, HCT, MCV, PLT in the last 168 hours. Cardiac Enzymes: No results for input(s): CKTOTAL, CKMB, CKMBINDEX, TROPONINI in the last 168 hours. BNP: BNP (last 3 results) No results for input(s): PROBNP in the last 8760 hours. CBG: No results for input(s): GLUCAP in the last 168 hours.     Signed:  Hartley Barefoot A  [facility] 06/24/2014, 1:44 PM

## 2014-07-01 ENCOUNTER — Encounter: Payer: Self-pay | Admitting: Vascular Surgery

## 2014-07-02 ENCOUNTER — Encounter: Payer: Self-pay | Admitting: Vascular Surgery

## 2014-07-02 ENCOUNTER — Ambulatory Visit (INDEPENDENT_AMBULATORY_CARE_PROVIDER_SITE_OTHER): Payer: Self-pay | Admitting: Vascular Surgery

## 2014-07-02 VITALS — BP 121/80 | HR 84 | Temp 99.6°F | Resp 16 | Ht 67.0 in | Wt 120.0 lb

## 2014-07-02 DIAGNOSIS — F1990 Other psychoactive substance use, unspecified, uncomplicated: Secondary | ICD-10-CM

## 2014-07-02 DIAGNOSIS — I721 Aneurysm of artery of upper extremity: Secondary | ICD-10-CM | POA: Insufficient documentation

## 2014-07-02 DIAGNOSIS — L02413 Cutaneous abscess of right upper limb: Secondary | ICD-10-CM

## 2014-07-02 NOTE — Progress Notes (Signed)
    Established Patient  History of Present Illness  [name] is a 29 y.o. (November 29, 1985) female who presents for re-evaluation of R arm abscess.  She was seen in the hospital with an abscess adjacent to a small muscular Branch PSA fed by the brachial artery.  Her PSA and abscess are related to IV drug injections.  She left the hospital AMA and has been lost to follow up.  She denies any bleeding complications and has healed up her abscess.  The patient's PMH, PSH, SH, FamHx, Med, and Allergies are unchanged from 06/02/14.  On ROS today: no fever, no abscess drainage, no bleeding  Physical Examination  Filed Vitals:   07/02/14 1216  BP: 121/80  Pulse: 84  Temp: 99.6 F (37.6 C)  TempSrc: Oral  Resp: 16  Height: 5\' 7"  (1.702 m)  Weight: 120 lb (54.432 kg)  SpO2: 100%   Body mass index is 18.79 kg/(m^2).  General: A&O x 3, WD, thin  R arm: wound has healed, prominent distal brachial pulse and palpable prominent pulse at the prior abscess site, no active bleeding  Medical Decision Making  [name] is a 29 y.o. female who presents with IVDA resulting in small L brachiobasilic fistula, resolved abscess, possible muscular Branch PSA   Pt is asx at this point without any recent bleeding complications.  I suspect you could not figure out if she has a AVF or PSA without additional studies including R arm antecubital exploration +/- R arm arteriogram   I would manage her complications non-operatively and see if she spontaneously thromboses her PSA/AVF.  Repeat R brachial artery duplex in 6 months.  Follow up at that point.  Pt reportedly is going to rehab, so hopefully she will be able to kick her habit before it results in a life-threatening complication.  Leonides SakeBrian Chen, MD [facility] [place] Office: [phone] Pager: [phone]  07/02/2014, 12:33 PM

## 2014-07-02 NOTE — Addendum Note (Signed)
Addended by: MCCHESNEY, MARILYN K on: 07/02/2014 03:29 PM   Modules accepted: Orders  

## 2014-12-27 ENCOUNTER — Encounter: Payer: Self-pay | Admitting: Vascular Surgery

## 2014-12-29 ENCOUNTER — Other Ambulatory Visit: Payer: Self-pay | Admitting: Vascular Surgery

## 2014-12-29 DIAGNOSIS — I729 Aneurysm of unspecified site: Secondary | ICD-10-CM

## 2014-12-31 ENCOUNTER — Ambulatory Visit: Payer: Self-pay | Admitting: Vascular Surgery

## 2014-12-31 ENCOUNTER — Inpatient Hospital Stay (HOSPITAL_COMMUNITY): Admission: RE | Admit: 2014-12-31 | Payer: Self-pay | Source: Ambulatory Visit

## 2015-05-27 ENCOUNTER — Emergency Department (HOSPITAL_COMMUNITY)
Admission: EM | Admit: 2015-05-27 | Discharge: 2015-05-27 | Discharge status: Against Medical Advice | Payer: Self-pay | Attending: Emergency Medicine | Admitting: Emergency Medicine

## 2015-05-27 ENCOUNTER — Emergency Department (HOSPITAL_COMMUNITY): Payer: Self-pay

## 2015-05-27 ENCOUNTER — Encounter (HOSPITAL_COMMUNITY): Payer: Self-pay | Admitting: Emergency Medicine

## 2015-05-27 DIAGNOSIS — F319 Bipolar disorder, unspecified: Secondary | ICD-10-CM | POA: Insufficient documentation

## 2015-05-27 DIAGNOSIS — M545 Low back pain: Secondary | ICD-10-CM | POA: Insufficient documentation

## 2015-05-27 DIAGNOSIS — F1110 Opioid abuse, uncomplicated: Secondary | ICD-10-CM | POA: Insufficient documentation

## 2015-05-27 DIAGNOSIS — F1210 Cannabis abuse, uncomplicated: Secondary | ICD-10-CM | POA: Insufficient documentation

## 2015-05-27 DIAGNOSIS — F419 Anxiety disorder, unspecified: Secondary | ICD-10-CM | POA: Insufficient documentation

## 2015-05-27 DIAGNOSIS — F1910 Other psychoactive substance abuse, uncomplicated: Secondary | ICD-10-CM

## 2015-05-27 DIAGNOSIS — R0789 Other chest pain: Secondary | ICD-10-CM | POA: Insufficient documentation

## 2015-05-27 DIAGNOSIS — R197 Diarrhea, unspecified: Secondary | ICD-10-CM | POA: Insufficient documentation

## 2015-05-27 DIAGNOSIS — Z792 Long term (current) use of antibiotics: Secondary | ICD-10-CM | POA: Insufficient documentation

## 2015-05-27 DIAGNOSIS — Z79899 Other long term (current) drug therapy: Secondary | ICD-10-CM | POA: Insufficient documentation

## 2015-05-27 DIAGNOSIS — J45901 Unspecified asthma with (acute) exacerbation: Secondary | ICD-10-CM | POA: Insufficient documentation

## 2015-05-27 DIAGNOSIS — F17210 Nicotine dependence, cigarettes, uncomplicated: Secondary | ICD-10-CM | POA: Insufficient documentation

## 2015-05-27 DIAGNOSIS — N390 Urinary tract infection, site not specified: Secondary | ICD-10-CM | POA: Insufficient documentation

## 2015-05-27 DIAGNOSIS — Z8719 Personal history of other diseases of the digestive system: Secondary | ICD-10-CM | POA: Insufficient documentation

## 2015-05-27 DIAGNOSIS — Z3202 Encounter for pregnancy test, result negative: Secondary | ICD-10-CM | POA: Insufficient documentation

## 2015-05-27 DIAGNOSIS — F1310 Sedative, hypnotic or anxiolytic abuse, uncomplicated: Secondary | ICD-10-CM | POA: Insufficient documentation

## 2015-05-27 LAB — URINALYSIS, ROUTINE W REFLEX MICROSCOPIC
Glucose, UA: NEGATIVE mg/dL
Hgb urine dipstick: NEGATIVE
Ketones, ur: 15 mg/dL — AB
NITRITE: POSITIVE — AB
PH: 6 (ref 5.0–8.0)
Protein, ur: NEGATIVE mg/dL
SPECIFIC GRAVITY, URINE: 1.025 (ref 1.005–1.030)

## 2015-05-27 LAB — COMPREHENSIVE METABOLIC PANEL
ALBUMIN: 3.2 g/dL — AB (ref 3.5–5.0)
ALK PHOS: 147 U/L — AB (ref 38–126)
ALT: 47 U/L (ref 14–54)
ANION GAP: 8 (ref 5–15)
AST: 56 U/L — ABNORMAL HIGH (ref 15–41)
BUN: 7 mg/dL (ref 6–20)
CALCIUM: 8.8 mg/dL — AB (ref 8.9–10.3)
CHLORIDE: 106 mmol/L (ref 101–111)
CO2: 26 mmol/L (ref 22–32)
Creatinine, Ser: 0.59 mg/dL (ref 0.44–1.00)
GFR calc Af Amer: >60 mL/min (ref >60)
GFR calc non Af Amer: >60 mL/min (ref >60)
GLUCOSE: 96 mg/dL (ref 65–99)
Potassium: 3.8 mmol/L (ref 3.5–5.1)
SODIUM: 140 mmol/L (ref 135–145)
Total Bilirubin: 0.7 mg/dL (ref 0.3–1.2)
Total Protein: 6 g/dL — ABNORMAL LOW (ref 6.5–8.1)

## 2015-05-27 LAB — RAPID URINE DRUG SCREEN, HOSP PERFORMED
Amphetamines: NOT DETECTED
Barbiturates: NOT DETECTED
Benzodiazepines: POSITIVE — AB
Cocaine: NOT DETECTED
Opiates: POSITIVE — AB
Tetrahydrocannabinol: POSITIVE — AB

## 2015-05-27 LAB — CBC WITH DIFFERENTIAL/PLATELET
BASOS PCT: 1 %
Basophils Absolute: 0 10*3/uL (ref 0.0–0.1)
EOS ABS: 0.2 10*3/uL (ref 0.0–0.7)
EOS PCT: 4 %
HCT: 46.2 % — ABNORMAL HIGH (ref 36.0–46.0)
Hemoglobin: 15.2 g/dL — ABNORMAL HIGH (ref 12.0–15.0)
Lymphocytes Relative: 37 %
Lymphs Abs: 1.9 10*3/uL (ref 0.7–4.0)
MCH: 31.1 pg (ref 26.0–34.0)
MCHC: 32.9 g/dL (ref 30.0–36.0)
MCV: 94.7 fL (ref 78.0–100.0)
Monocytes Absolute: 0.4 10*3/uL (ref 0.1–1.0)
Monocytes Relative: 8 %
Neutro Abs: 2.6 10*3/uL (ref 1.7–7.7)
Neutrophils Relative %: 50 %
PLATELETS: 160 10*3/uL (ref 150–400)
RBC: 4.88 MIL/uL (ref 3.87–5.11)
RDW: 15.9 % — ABNORMAL HIGH (ref 11.5–15.5)
WBC: 5.1 10*3/uL (ref 4.0–10.5)

## 2015-05-27 LAB — PREGNANCY, URINE: PREG TEST UR: NEGATIVE

## 2015-05-27 LAB — URINE MICROSCOPIC-ADD ON: RBC / HPF: NONE SEEN RBC/hpf (ref 0–5)

## 2015-05-27 LAB — TROPONIN I: Troponin I: <0.03 ng/mL (ref <0.031)

## 2015-05-27 LAB — ETHANOL

## 2015-05-27 LAB — I-STAT CG4 LACTIC ACID, ED: LACTIC ACID, VENOUS: 1.17 mmol/L (ref 0.5–2.0)

## 2015-05-27 NOTE — ED Notes (Signed)
Pt refusing care, states she is ready to leave.

## 2015-05-27 NOTE — ED Provider Notes (Signed)
CSN: [id number]     Arrival date & time 05/27/15  1912 History   First MD Initiated Contact with Patient 05/27/15 1940     Chief Complaint  Patient presents with  . Chest Pain     (Consider location/radiation/quality/duration/timing/severity/associated sxs/prior Treatment) HPI Comments: Patient brought in by police after being arrested. She began complaining of chest pain and multiple other complaints. Patient has a history of intermittent atrial fibrillation she is not anticoagulated. History of IV drug abuse that she denies any since August 2016. Patient has had intermittent left-sided chest pain twice daily for the past 3 days. Pain lasted about 30 seconds at a time and then resolves. It is associated with shortness of breath and radiates to her neck and back. She says she feels short of breath but she thinks is because of the weather change in her asthma. She does smoke cigarettes. She does not take any blood thinners or birth control. She complains of pain with urination as well as low back pain over the past 3 days. No focal weakness in arms or legs. No incontinence. No IV drug use for the past 4 months. Also states she had a fever to 102 for 5 days ago.  The history is provided by the patient and the police.    Past Medical History  Diagnosis Date  . Asthma   . Atrial fibrillation (HCC)     LV function lower limit of normal   . GERD (gastroesophageal reflux disease)   . Depression   . Polysubstance dependence including opioid type drug, continuous use (HCC)     prior heroin overdose  . Left against medical advice 12/12/13  . Bipolar disorder [facility])    Past Surgical History  Procedure Laterality Date  . Catheter ablation      she reports prior ablatio (More than 5 years ago) by Dr Rudolpho Sevin in [facility]   Family History  Problem Relation Age of Onset  . Asthma Brother   . Hypertension Mother   . Hypertension Maternal Grandmother    Social History  Substance Use Topics  .  Smoking status: Current Every Day Smoker -- 0.50 packs/day    Types: Cigarettes  . Smokeless tobacco: None     Comment: since 2002; smokes about 8 cig/day   . Alcohol Use: Yes     Comment: heavy on occasion   OB History    Gravida Para Term Preterm AB TAB SAB Ectopic Multiple Living   Review of Systems  Constitutional: Positive for fever, activity change, appetite change and fatigue.  HENT: Negative for congestion and rhinorrhea.   Eyes: Negative for visual disturbance.  Respiratory: Positive for chest tightness and shortness of breath.   Cardiovascular: Positive for chest pain.  Gastrointestinal: Positive for nausea, vomiting, abdominal pain and diarrhea.  Genitourinary: Positive for dysuria, frequency and flank pain. Negative for hematuria, vaginal bleeding and vaginal discharge.  Musculoskeletal: Positive for myalgias, back pain and arthralgias.  Skin: Negative for rash.  Neurological: Positive for weakness. Negative for dizziness and light-headedness.  A complete 10 system review of systems was obtained and all systems are negative except as noted in the HPI and PMH. .     Allergies  Review of patient's allergies indicates no known allergies.  Home Medications   Prior to Admission medications   Medication Sig Start Date End Date Taking? Authorizing Provider  albuterol (PROVENTIL HFA;VENTOLIN HFA) 108 (90 BASE)  MCG/ACT inhaler Inhale 1-2 puffs into the lungs every 6 (six) hours as needed for wheezing or shortness of breath.    Historical Provider, MD  cephALEXin (KEFLEX) 500 MG capsule Take 1 capsule (500 mg total) by mouth 4 (four) times daily. 05/28/15   Glynn Octave, MD  ciprofloxacin (CIPRO) 500 MG tablet Take 1 tablet (500 mg total) by mouth 2 (two) times daily. 02/08/14   Oswaldo Conroy, PA-C  clonazePAM (KLONOPIN) 0.5 MG tablet Take 0.5 mg by mouth 2 (two) times daily as needed for anxiety.    Historical Provider, MD  diltiazem (CARDIZEM CD) 120  MG 24 hr capsule Take 120 mg by mouth daily.    Historical Provider, MD  Fluticasone-Salmeterol (ADVAIR) 250-50 MCG/DOSE AEPB Inhale 1 puff into the lungs 2 (two) times daily.    Historical Provider, MD  ipratropium-albuterol (DUONEB) 0.5-2.5 (3) MG/3ML SOLN Take 3 mLs by nebulization 3 (three) times daily as needed (wheezing and shortness of breath). Pharmacy may substitute for generic equivalents 04/29/12   Clanford L Laural Benes, MD  ondansetron (ZOFRAN) 4 MG tablet Take 1 tablet (4 mg total) by mouth every 6 (six) hours. 02/08/14   Oswaldo Conroy, PA-C   BP 118/88 mmHg  Pulse 66  Temp(Src) 98.6 F (37 C) (Oral)  Resp 13  SpO2 100% Physical Exam  Constitutional: She is oriented to person, place, and time. She appears well-developed and well-nourished. No distress.  Tearful, anxious  HENT:  Head: Normocephalic and atraumatic.  Mouth/Throat: Oropharynx is clear and moist. No oropharyngeal exudate.  Eyes: Conjunctivae and EOM are normal. Pupils are equal, round, and reactive to light.  Neck: Normal range of motion. Neck supple.  No meningismus.  Cardiovascular: Normal rate, regular rhythm, normal heart sounds and intact distal pulses.   No murmur heard. Pulmonary/Chest: Effort normal and breath sounds normal. No respiratory distress. She exhibits tenderness.  TTP L sternal border  Abdominal: Soft. There is no tenderness. There is no rebound and no guarding.  Musculoskeletal: Normal range of motion. She exhibits tenderness. She exhibits no edema.  Paraspinal lumbar tenderness. No midline tenderness  Neurological: She is alert and oriented to person, place, and time. No cranial nerve deficit. She exhibits normal muscle tone. Coordination normal.  No ataxia on finger to nose bilaterally. No pronator drift. 5/5 strength throughout. CN 2-12 intact.Equal grip strength. Sensation intact.   Skin: Skin is warm.  Psychiatric: She has a normal mood and affect. Her behavior is normal.  Nursing note and  vitals reviewed.   ED Course  Procedures (including critical care time) Labs Review Labs Reviewed  CBC WITH DIFFERENTIAL/PLATELET - Abnormal; Notable for the following:    Hemoglobin 15.2 (*)    HCT 46.2 (*)    RDW 15.9 (*)    All other components within normal limits  COMPREHENSIVE METABOLIC PANEL - Abnormal; Notable for the following:    Calcium 8.8 (*)    Total Protein 6.0 (*)    Albumin 3.2 (*)    AST 56 (*)    Alkaline Phosphatase 147 (*)    All other components within normal limits  URINALYSIS, ROUTINE W REFLEX MICROSCOPIC (NOT AT [facility]) - Abnormal; Notable for the following:    Color, Urine AMBER (*)    APPearance CLOUDY (*)    Bilirubin Urine SMALL (*)    Ketones, ur 15 (*)    Nitrite POSITIVE (*)    Leukocytes, UA SMALL (*)    All other components within normal limits  URINE RAPID DRUG SCREEN,  HOSP PERFORMED - Abnormal; Notable for the following:    Opiates POSITIVE (*)    Benzodiazepines POSITIVE (*)    Tetrahydrocannabinol POSITIVE (*)    All other components within normal limits  URINE MICROSCOPIC-ADD ON - Abnormal; Notable for the following:    Squamous Epithelial / LPF 0-5 (*)    Bacteria, UA MANY (*)    All other components within normal limits  CULTURE, BLOOD (ROUTINE X 2)  CULTURE, BLOOD (ROUTINE X 2)  TROPONIN I  PREGNANCY, URINE  ETHANOL  I-STAT CG4 LACTIC ACID, ED    Imaging Review No results found. I have personally reviewed and evaluated these images and lab results as part of my medical decision-making.   EKG Interpretation   Date/Time:  Friday May 27 2015 19:28:51 EST Ventricular Rate:  74 PR Interval:  146 QRS Duration: 85 QT Interval:  399 QTC Calculation: 443 R Axis:   71 Text Interpretation:  Sinus rhythm Ventricular premature complex No  significant change was found Confirmed by Manus GunningANCOUR  MD, Ebany Bowermaster [phone]([id number]) on  05/27/2015 8:03:04 PM      MDM   Final diagnoses:  Atypical chest pain  Polysubstance abuse  Urinary tract  infection without hematuria, site unspecified   Developed chest pain while being arrested. Hx A fib and remote IVDU.  States pain occurs twice daily for the past 3 days, lasting a few seconds at at time.  EKG nsr.   Pain atypical for ACS or PE.  PERC negative.  CXR negative.  Chest wall is tender to palpation.  UA ordered given urinary complaints and flank pain. UDS positive for opiates, benzos, THC.  Negative for cocaine.    Patient states she is ready to leave because if she doesn't leave now, "I will have to be in jail all weekend."  She refuses second troponin and is aware UA is still pending.  She appears to have capacity to make medical decisions and will leave AMA in police custody.    UA shows infection after patient left.  Prescription will be attempted to be called in.      Glynn OctaveStephen Rondi Ivy, MD 05/30/15 [phone]

## 2015-05-27 NOTE — ED Notes (Addendum)
Per [facility], patient was being served warrant by police and started having sharp chest pain in the center of her chest. Patient in no apparent distress at this time.  Patient also complains of lower back pain for 3 days.  States she has a.fib and is prescribed diltiazem but only takes it when she feels short of breath.  Patient currently in police custody.

## 2015-05-27 NOTE — ED Notes (Signed)
Pt unable to urinate. Informed Callie - RN.

## 2015-05-28 MED ORDER — CEPHALEXIN 500 MG PO CAPS: 500.0000 mg | ORAL_CAPSULE | Freq: Four times a day (QID) | ORAL | Status: DC

## 2015-06-01 LAB — CULTURE, BLOOD (ROUTINE X 2)
CULTURE: NO GROWTH
CULTURE: NO GROWTH

## 2016-01-25 ENCOUNTER — Encounter (HOSPITAL_COMMUNITY): Payer: Self-pay

## 2016-01-25 ENCOUNTER — Emergency Department (HOSPITAL_COMMUNITY)
Admission: EM | Admit: 2016-01-25 | Discharge: 2016-01-25 | Disposition: A | Discharge status: Home / Self Care | Payer: Self-pay | Attending: Emergency Medicine | Admitting: Emergency Medicine

## 2016-01-25 DIAGNOSIS — N12 Tubulo-interstitial nephritis, not specified as acute or chronic: Secondary | ICD-10-CM | POA: Insufficient documentation

## 2016-01-25 DIAGNOSIS — F17210 Nicotine dependence, cigarettes, uncomplicated: Secondary | ICD-10-CM | POA: Insufficient documentation

## 2016-01-25 DIAGNOSIS — Z7951 Long term (current) use of inhaled steroids: Secondary | ICD-10-CM | POA: Insufficient documentation

## 2016-01-25 DIAGNOSIS — Z79899 Other long term (current) drug therapy: Secondary | ICD-10-CM | POA: Insufficient documentation

## 2016-01-25 DIAGNOSIS — J45909 Unspecified asthma, uncomplicated: Secondary | ICD-10-CM | POA: Insufficient documentation

## 2016-01-25 LAB — CBC
HCT: 55.9 % — ABNORMAL HIGH (ref 36.0–46.0)
Hemoglobin: 19.9 g/dL — ABNORMAL HIGH (ref 12.0–15.0)
MCH: 31.9 pg (ref 26.0–34.0)
MCHC: 35.6 g/dL (ref 30.0–36.0)
MCV: 89.6 fL (ref 78.0–100.0)
PLATELETS: 261 10*3/uL (ref 150–400)
RBC: 6.24 MIL/uL — AB (ref 3.87–5.11)
RDW: 14.1 % (ref 11.5–15.5)
WBC: 10.4 10*3/uL (ref 4.0–10.5)

## 2016-01-25 LAB — URINE MICROSCOPIC-ADD ON

## 2016-01-25 LAB — LIPASE, BLOOD: Lipase: 29 U/L (ref 11–51)

## 2016-01-25 LAB — COMPREHENSIVE METABOLIC PANEL
ALK PHOS: 99 U/L (ref 38–126)
ALT: 38 U/L (ref 14–54)
AST: 37 U/L (ref 15–41)
Albumin: 5.1 g/dL — ABNORMAL HIGH (ref 3.5–5.0)
Anion gap: 11 (ref 5–15)
BUN: 15 mg/dL (ref 6–20)
CALCIUM: 9.8 mg/dL (ref 8.9–10.3)
CO2: 25 mmol/L (ref 22–32)
CREATININE: 0.7 mg/dL (ref 0.44–1.00)
Chloride: 100 mmol/L — ABNORMAL LOW (ref 101–111)
Glucose, Bld: 125 mg/dL — ABNORMAL HIGH (ref 65–99)
Potassium: 3.8 mmol/L (ref 3.5–5.1)
SODIUM: 136 mmol/L (ref 135–145)
Total Bilirubin: 1.9 mg/dL — ABNORMAL HIGH (ref 0.3–1.2)
Total Protein: 9.4 g/dL — ABNORMAL HIGH (ref 6.5–8.1)

## 2016-01-25 LAB — URINALYSIS, ROUTINE W REFLEX MICROSCOPIC
BILIRUBIN URINE: NEGATIVE
Glucose, UA: NEGATIVE mg/dL
KETONES UR: NEGATIVE mg/dL
Nitrite: POSITIVE — AB
PROTEIN: 100 mg/dL — AB
Specific Gravity, Urine: 1.019 (ref 1.005–1.030)
pH: 5.5 (ref 5.0–8.0)

## 2016-01-25 LAB — POC URINE PREG, ED: PREG TEST UR: NEGATIVE

## 2016-01-25 MED ORDER — ACETAMINOPHEN 500 MG PO TABS
1000.0000 mg | ORAL_TABLET | Freq: Once | ORAL | Status: AC
  Administered 2016-01-25: 1000 mg via ORAL
  Filled 2016-01-25: qty 2

## 2016-01-25 MED ORDER — ONDANSETRON HCL 4 MG/2ML IJ SOLN
4.0000 mg | Freq: Once | INTRAMUSCULAR | Status: AC
  Administered 2016-01-25: 4 mg via INTRAVENOUS
  Filled 2016-01-25: qty 2

## 2016-01-25 MED ORDER — ONDANSETRON 4 MG PO TBDP: 4.0000 mg | ORAL_TABLET | Freq: Three times a day (TID) | ORAL | 0 refills | Status: DC | PRN

## 2016-01-25 MED ORDER — CEPHALEXIN 500 MG PO CAPS
500.0000 mg | ORAL_CAPSULE | Freq: Once | ORAL | Status: AC
  Administered 2016-01-25: 500 mg via ORAL
  Filled 2016-01-25: qty 1

## 2016-01-25 MED ORDER — CEPHALEXIN 500 MG PO CAPS: 500.0000 mg | ORAL_CAPSULE | Freq: Two times a day (BID) | ORAL | 0 refills | Status: DC

## 2016-01-25 NOTE — ED Notes (Signed)
EMS gave zofran in route

## 2016-01-25 NOTE — ED Provider Notes (Signed)
[facility]-EMERGENCY DEPT Provider Note   CSN: [id number] Arrival date & time: 01/25/16  0028  By signing my name below, I, Emmanuella Mensah, attest that this documentation has been prepared under the direction and in the presence of Tomasita Crumble, MD. Electronically Signed: Angelene Giovanni, ED Scribe. 01/25/16. 4:06 AM.   History   Chief Complaint Chief Complaint  Patient presents with  . Emesis    HPI Comments: [name] is a 30 y.o. female who presents to the Emergency Department complaining of ongoing lower mid abdominal pain onset 2 days ago. She reports associated multiple episodes of non-bloody vomiting, dysuria, lower back pain, and subjective fever. No alleviating factors noted. Pt has not tried any medications PTA. She reports a hx of recurrent UTIs and states that these symptoms are consistent with her past symptoms. She denies any recent falls, injuries, or trauma. She denies any vaginal bleeding, vaginal discharge, or diarrhea.    The history is provided by the patient. No language interpreter was used.    Past Medical History:  Diagnosis Date  . Asthma   . Atrial fibrillation (HCC)    LV function lower limit of normal   . Bipolar disorder (HCC)   . Depression   . GERD (gastroesophageal reflux disease)   . Left against medical advice 12/12/13  . Polysubstance dependence including opioid type drug, continuous use (HCC)    prior heroin overdose    Patient Active Problem List   Diagnosis Date Noted  . 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 Cellulitis 06/02/2014  . IV drug user 06/02/2014  . Abscess of right arm 06/02/2014  . Left against medical advice 12/14/2013  . Atrial fibrillation with RVR (HCC) 12/11/2013  . Atrial fibrillation with rapid ventricular response (HCC) 12/11/2013  . Overdose of heroin 10/19/2012  . POST-OP CARDIAC COMPLICATION 07/20/2009  . SYNCOPE 11/30/2008  . CARDIOMYOPATHY, SECONDARY 05/17/2008  . Atrial  fibrillation (HCC) 05/17/2008  . Asthma 03/12/2008  . COUGH 03/12/2008    Past Surgical History:  Procedure Laterality Date  . catheter ablation     she reports prior ablatio (More than 5 years ago) by Dr Rudolpho Sevin in [facility]    OB History    Gravida Para Term Preterm AB Living   5 2   2 3 2    SAB TAB Ectopic Multiple Live Births   1 2     2        Home Medications    Prior to Admission medications   Medication Sig Start Date End Date Taking? Authorizing Provider  albuterol (PROVENTIL HFA;VENTOLIN HFA) 108 (90 BASE) MCG/ACT inhaler Inhale 1-2 puffs into the lungs every 6 (six) hours as needed for wheezing or shortness of breath.   Yes Historical Provider, MD  diltiazem (CARDIZEM CD) 120 MG 24 hr capsule Take 120 mg by mouth daily.   Yes Historical Provider, MD  cephALEXin (KEFLEX) 500 MG capsule Take 1 capsule (500 mg total) by mouth 4 (four) times daily. Patient not taking: Reported on 01/25/2016 05/28/15   Glynn Octave, MD  ciprofloxacin (CIPRO) 500 MG tablet Take 1 tablet (500 mg total) by mouth 2 (two) times daily. Patient not taking: Reported on 01/25/2016 02/08/14   Oswaldo Conroy, PA-C  ondansetron (ZOFRAN) 4 MG tablet Take 1 tablet (4 mg total) by mouth every 6 (six) hours. Patient not taking: Reported on 01/25/2016 02/08/14   Oswaldo Conroy, PA-C    Family History Family History  Problem Relation Age of Onset  .  Asthma Brother   . Hypertension Mother   . Hypertension Maternal Grandmother     Social History Social History  Substance Use Topics  . Smoking status: Current Every Day Smoker    Packs/day: 0.50    Types: Cigarettes  . Smokeless tobacco: Current User     Comment: since 2002; smokes about 8 cig/day   . Alcohol use Yes     Comment: heavy on occasion     Allergies   Review of patient's allergies indicates no known allergies.   Review of Systems Review of Systems  Constitutional: Positive for fever.  Gastrointestinal: Positive for abdominal  pain, nausea and vomiting. Negative for diarrhea.  Genitourinary: Positive for dysuria. Negative for vaginal bleeding and vaginal discharge.  Musculoskeletal: Positive for back pain.  All other systems reviewed and are negative.    Physical Exam Updated Vital Signs BP (!) 124/103   Pulse 65   Temp 98.6 F (37 C)   Resp 14   SpO2 96%   Physical Exam  Constitutional: She is oriented to person, place, and time. She appears well-developed and well-nourished. No distress.  Tactile fever  HENT:  Head: Normocephalic and atraumatic.  Nose: Nose normal.  Mouth/Throat: Oropharynx is clear and moist. No oropharyngeal exudate.  Poor dentition  Eyes: Conjunctivae and EOM are normal. Pupils are equal, round, and reactive to light. No scleral icterus.  Neck: Normal range of motion. Neck supple. No JVD present. No tracheal deviation present. No thyromegaly present.  Cardiovascular: Normal rate, regular rhythm and normal heart sounds.  Exam reveals no gallop and no friction rub.   No murmur heard. Pulmonary/Chest: Effort normal and breath sounds normal. No respiratory distress. She has no wheezes. She exhibits no tenderness.  Abdominal: Soft. Bowel sounds are normal. She exhibits no distension and no mass. There is tenderness. There is no rebound and no guarding.  Suprapubic TTP  Musculoskeletal: Normal range of motion. She exhibits no edema or tenderness.  Lymphadenopathy:    She has no cervical adenopathy.  Neurological: She is alert and oriented to person, place, and time. No cranial nerve deficit. She exhibits normal muscle tone.  Skin: Skin is warm and dry. No rash noted. No erythema. No pallor.  Nursing note and vitals reviewed.    ED Treatments / Results  DIAGNOSTIC STUDIES: Oxygen Saturation is 97% on RA, normal by my interpretation.    COORDINATION OF CARE: 4:07 AM- Pt advised of plan for treatment and pt agrees. She will receive lab work for further evaluation.    Labs (all  labs ordered are listed, but only abnormal results are displayed) Labs Reviewed  COMPREHENSIVE METABOLIC PANEL - Abnormal; Notable for the following:       Result Value   Chloride 100 (*)    Glucose, Bld 125 (*)    Total Protein 9.4 (*)    Albumin 5.1 (*)    Total Bilirubin 1.9 (*)    All other components within normal limits  CBC - Abnormal; Notable for the following:    RBC 6.24 (*)    Hemoglobin 19.9 (*)    HCT 55.9 (*)    All other components within normal limits  URINALYSIS, ROUTINE W REFLEX MICROSCOPIC (NOT AT [facility]) - Abnormal; Notable for the following:    Color, Urine AMBER (*)    APPearance CLOUDY (*)    Hgb urine dipstick TRACE (*)    Protein, ur 100 (*)    Nitrite POSITIVE (*)    Leukocytes, UA SMALL (*)  All other components within normal limits  URINE MICROSCOPIC-ADD ON - Abnormal; Notable for the following:    Squamous Epithelial / LPF 0-5 (*)    Bacteria, UA MANY (*)    All other components within normal limits  LIPASE, BLOOD  POC URINE PREG, ED    EKG  EKG Interpretation None       Radiology No results found.  Procedures Procedures (including critical care time)  Medications Ordered in ED Medications  ondansetron (ZOFRAN) injection 4 mg (not administered)  acetaminophen (TYLENOL) tablet 1,000 mg (not administered)  cephALEXin (KEFLEX) capsule 500 mg (not administered)     Initial Impression / Assessment and Plan / ED Course  Tomasita CrumbleAdeleke Vineet Kinney, MD has reviewed the triage vital signs and the nursing notes.  Pertinent labs & imaging results that were available during my care of the patient were reviewed by me and considered in my medical decision making (see chart for details).  Clinical Course   Patient presents to the ED for dysuria with back pain and subjective fevers.  UA reveals an infection and her history is consistent with pyleonephritis. She was given keflex in the ED.  Will DC home with 10day treatment.  PCP fu advised within 3 days.  She  appears well and in NAD. VS remain within her normal limits and she is safe for DC.   Final Clinical Impressions(s) / ED Diagnoses   Final diagnoses:  None    New Prescriptions New Prescriptions   No medications on file      I personally performed the services described in this documentation, which was scribed in my presence. The recorded information has been reviewed and is accurate.      Tomasita CrumbleAdeleke Zahlia Deshazer, MD 01/25/16 [phone]

## 2016-01-25 NOTE — ED Notes (Signed)
Bed: [id number] Expected date:  Expected time:  Means of arrival:  Comments: Needs cleaning

## 2016-01-25 NOTE — ED Triage Notes (Signed)
Pt complains of vomiting for two days and having lower back pain, she states that she' shad UTI's in the past and her urine is dark and has a strong odor

## 2016-03-27 ENCOUNTER — Ambulatory Visit (HOSPITAL_COMMUNITY)
Admission: RE | Admit: 2016-03-27 | Discharge: 2016-03-27 | Disposition: A | Discharge status: Home / Self Care | Payer: Self-pay | Source: Home / Self Care | Attending: Psychiatry | Admitting: Psychiatry

## 2016-03-27 ENCOUNTER — Emergency Department (HOSPITAL_COMMUNITY)
Admission: EM | Admit: 2016-03-27 | Discharge: 2016-03-27 | Disposition: A | Discharge status: Other Acute Inpatient Hospital | Payer: Medicaid Other | Attending: Emergency Medicine | Admitting: Emergency Medicine

## 2016-03-27 ENCOUNTER — Inpatient Hospital Stay (HOSPITAL_COMMUNITY)
Admission: AD | Admit: 2016-03-27 | Discharge: 2016-03-31 | DRG: 202 | Disposition: A | Discharge status: Home / Self Care | Payer: Self-pay | Source: Intra-hospital | Attending: Cardiology | Admitting: Cardiology

## 2016-03-27 ENCOUNTER — Encounter (HOSPITAL_COMMUNITY): Payer: Self-pay

## 2016-03-27 DIAGNOSIS — F319 Bipolar disorder, unspecified: Secondary | ICD-10-CM | POA: Diagnosis present

## 2016-03-27 DIAGNOSIS — K219 Gastro-esophageal reflux disease without esophagitis: Secondary | ICD-10-CM | POA: Diagnosis present

## 2016-03-27 DIAGNOSIS — F17210 Nicotine dependence, cigarettes, uncomplicated: Secondary | ICD-10-CM | POA: Diagnosis present

## 2016-03-27 DIAGNOSIS — F333 Major depressive disorder, recurrent, severe with psychotic symptoms: Secondary | ICD-10-CM | POA: Diagnosis present

## 2016-03-27 DIAGNOSIS — F1994 Other psychoactive substance use, unspecified with psychoactive substance-induced mood disorder: Secondary | ICD-10-CM | POA: Diagnosis present

## 2016-03-27 DIAGNOSIS — J45909 Unspecified asthma, uncomplicated: Secondary | ICD-10-CM | POA: Insufficient documentation

## 2016-03-27 DIAGNOSIS — R45851 Suicidal ideations: Secondary | ICD-10-CM

## 2016-03-27 DIAGNOSIS — I480 Paroxysmal atrial fibrillation: Secondary | ICD-10-CM | POA: Diagnosis present

## 2016-03-27 DIAGNOSIS — Z825 Family history of asthma and other chronic lower respiratory diseases: Secondary | ICD-10-CM

## 2016-03-27 DIAGNOSIS — F1120 Opioid dependence, uncomplicated: Secondary | ICD-10-CM | POA: Diagnosis present

## 2016-03-27 DIAGNOSIS — F1990 Other psychoactive substance use, unspecified, uncomplicated: Secondary | ICD-10-CM | POA: Insufficient documentation

## 2016-03-27 DIAGNOSIS — R002 Palpitations: Secondary | ICD-10-CM

## 2016-03-27 DIAGNOSIS — I4891 Unspecified atrial fibrillation: Secondary | ICD-10-CM

## 2016-03-27 DIAGNOSIS — F332 Major depressive disorder, recurrent severe without psychotic features: Secondary | ICD-10-CM | POA: Diagnosis present

## 2016-03-27 DIAGNOSIS — I429 Cardiomyopathy, unspecified: Secondary | ICD-10-CM | POA: Diagnosis present

## 2016-03-27 DIAGNOSIS — F1910 Other psychoactive substance abuse, uncomplicated: Secondary | ICD-10-CM

## 2016-03-27 DIAGNOSIS — F1124 Opioid dependence with opioid-induced mood disorder: Secondary | ICD-10-CM | POA: Diagnosis present

## 2016-03-27 DIAGNOSIS — Z8249 Family history of ischemic heart disease and other diseases of the circulatory system: Secondary | ICD-10-CM

## 2016-03-27 LAB — CBC WITH DIFFERENTIAL/PLATELET
Basophils Absolute: 0 10*3/uL (ref 0.0–0.1)
Basophils Relative: 1 %
Eosinophils Absolute: 0.1 10*3/uL (ref 0.0–0.7)
Eosinophils Relative: 3 %
HEMATOCRIT: 51.9 % — AB (ref 36.0–46.0)
Hemoglobin: 18.2 g/dL — ABNORMAL HIGH (ref 12.0–15.0)
LYMPHS ABS: 1.5 10*3/uL (ref 0.7–4.0)
LYMPHS PCT: 33 %
MCH: 32 pg (ref 26.0–34.0)
MCHC: 35.1 g/dL (ref 30.0–36.0)
MCV: 91.2 fL (ref 78.0–100.0)
MONO ABS: 0.6 10*3/uL (ref 0.1–1.0)
MONOS PCT: 12 %
NEUTROS ABS: 2.4 10*3/uL (ref 1.7–7.7)
Neutrophils Relative %: 51 %
Platelets: 157 10*3/uL (ref 150–400)
RBC: 5.69 MIL/uL — ABNORMAL HIGH (ref 3.87–5.11)
RDW: 14.3 % (ref 11.5–15.5)
WBC: 4.6 10*3/uL (ref 4.0–10.5)

## 2016-03-27 LAB — RAPID URINE DRUG SCREEN, HOSP PERFORMED
Amphetamines: NOT DETECTED
BARBITURATES: NOT DETECTED
BENZODIAZEPINES: NOT DETECTED
Cocaine: NOT DETECTED
Opiates: POSITIVE — AB
Tetrahydrocannabinol: POSITIVE — AB

## 2016-03-27 LAB — COMPREHENSIVE METABOLIC PANEL
ALBUMIN: 4.2 g/dL (ref 3.5–5.0)
ALT: 54 U/L (ref 14–54)
AST: 76 U/L — AB (ref 15–41)
Alkaline Phosphatase: 110 U/L (ref 38–126)
Anion gap: 11 (ref 5–15)
BUN: 7 mg/dL (ref 6–20)
CHLORIDE: 99 mmol/L — AB (ref 101–111)
CO2: 25 mmol/L (ref 22–32)
Calcium: 9.5 mg/dL (ref 8.9–10.3)
Creatinine, Ser: 0.61 mg/dL (ref 0.44–1.00)
GFR calc Af Amer: >60 mL/min (ref >60)
GFR calc non Af Amer: >60 mL/min (ref >60)
GLUCOSE: 83 mg/dL (ref 65–99)
POTASSIUM: 4 mmol/L (ref 3.5–5.1)
Sodium: 135 mmol/L (ref 135–145)
Total Bilirubin: 1.5 mg/dL — ABNORMAL HIGH (ref 0.3–1.2)
Total Protein: 8.1 g/dL (ref 6.5–8.1)

## 2016-03-27 LAB — ETHANOL: Alcohol, Ethyl (B): <5 mg/dL (ref <5)

## 2016-03-27 MED ORDER — CLONIDINE HCL 0.1 MG PO TABS: 0.1000 mg | ORAL_TABLET | ORAL | Status: DC

## 2016-03-27 MED ORDER — LOPERAMIDE HCL 2 MG PO CAPS: 2.0000 mg | ORAL_CAPSULE | ORAL | Status: DC | PRN

## 2016-03-27 MED ORDER — TRAZODONE HCL 50 MG PO TABS: 50.0000 mg | ORAL_TABLET | Freq: Every evening | ORAL | Status: DC | PRN

## 2016-03-27 MED ORDER — MAGNESIUM HYDROXIDE 400 MG/5ML PO SUSP: 30.0000 mL | Freq: Every day | ORAL | Status: DC | PRN

## 2016-03-27 MED ORDER — LORAZEPAM 1 MG PO TABS
1.0000 mg | ORAL_TABLET | Freq: Once | ORAL | Status: AC
  Administered 2016-03-27: 1 mg via ORAL
  Filled 2016-03-27: qty 1

## 2016-03-27 MED ORDER — HYDROXYZINE HCL 25 MG PO TABS
25.0000 mg | ORAL_TABLET | Freq: Four times a day (QID) | ORAL | Status: DC | PRN
  Administered 2016-03-28 – 2016-03-29 (×2): 25 mg via ORAL
  Filled 2016-03-27 (×2): qty 1

## 2016-03-27 MED ORDER — TRAZODONE HCL 50 MG PO TABS
50.0000 mg | ORAL_TABLET | Freq: Every evening | ORAL | Status: DC | PRN
  Administered 2016-03-27 – 2016-03-30 (×4): 50 mg via ORAL
  Filled 2016-03-27 (×15): qty 1

## 2016-03-27 MED ORDER — ONDANSETRON 4 MG PO TBDP
4.0000 mg | ORAL_TABLET | Freq: Four times a day (QID) | ORAL | Status: DC | PRN
  Administered 2016-03-27: 4 mg via ORAL
  Filled 2016-03-27: qty 1

## 2016-03-27 MED ORDER — DILTIAZEM HCL ER COATED BEADS 120 MG PO CP24
120.0000 mg | ORAL_CAPSULE | Freq: Every day | ORAL | Status: DC
  Filled 2016-03-27 (×2): qty 1

## 2016-03-27 MED ORDER — METHOCARBAMOL 500 MG PO TABS
500.0000 mg | ORAL_TABLET | Freq: Three times a day (TID) | ORAL | Status: DC | PRN
  Administered 2016-03-28 – 2016-03-31 (×6): 500 mg via ORAL
  Filled 2016-03-27 (×6): qty 1

## 2016-03-27 MED ORDER — NAPROXEN 500 MG PO TABS
500.0000 mg | ORAL_TABLET | Freq: Two times a day (BID) | ORAL | Status: DC | PRN
  Administered 2016-03-28 – 2016-03-30 (×4): 500 mg via ORAL
  Filled 2016-03-27 (×4): qty 1

## 2016-03-27 MED ORDER — ONDANSETRON 4 MG PO TBDP
4.0000 mg | ORAL_TABLET | Freq: Four times a day (QID) | ORAL | Status: DC | PRN
  Administered 2016-03-28 – 2016-03-31 (×6): 4 mg via ORAL
  Filled 2016-03-27 (×6): qty 1

## 2016-03-27 MED ORDER — DILTIAZEM HCL ER COATED BEADS 120 MG PO CP24
120.0000 mg | ORAL_CAPSULE | Freq: Every day | ORAL | Status: DC
  Administered 2016-03-27: 120 mg via ORAL
  Filled 2016-03-27 (×2): qty 1

## 2016-03-27 MED ORDER — HYDROXYZINE HCL 25 MG PO TABS
25.0000 mg | ORAL_TABLET | Freq: Four times a day (QID) | ORAL | Status: DC | PRN
  Administered 2016-03-27: 25 mg via ORAL
  Filled 2016-03-27: qty 1

## 2016-03-27 MED ORDER — CLONIDINE HCL 0.1 MG PO TABS
0.1000 mg | ORAL_TABLET | ORAL | Status: DC
  Administered 2016-03-30 – 2016-03-31 (×2): 0.1 mg via ORAL
  Filled 2016-03-27 (×7): qty 1

## 2016-03-27 MED ORDER — ALBUTEROL SULFATE HFA 108 (90 BASE) MCG/ACT IN AERS: 1.0000 | INHALATION_SPRAY | Freq: Four times a day (QID) | RESPIRATORY_TRACT | Status: DC | PRN

## 2016-03-27 MED ORDER — ACETAMINOPHEN 325 MG PO TABS
650.0000 mg | ORAL_TABLET | Freq: Four times a day (QID) | ORAL | Status: DC | PRN
  Administered 2016-03-29 – 2016-03-30 (×2): 650 mg via ORAL
  Filled 2016-03-27 (×2): qty 2

## 2016-03-27 MED ORDER — DICYCLOMINE HCL 20 MG PO TABS
20.0000 mg | ORAL_TABLET | Freq: Four times a day (QID) | ORAL | Status: DC | PRN
  Administered 2016-03-27: 20 mg via ORAL
  Filled 2016-03-27: qty 1

## 2016-03-27 MED ORDER — LAMOTRIGINE 25 MG PO TABS
25.0000 mg | ORAL_TABLET | Freq: Every day | ORAL | Status: DC
  Filled 2016-03-27 (×2): qty 1

## 2016-03-27 MED ORDER — CLONIDINE HCL 0.1 MG PO TABS: 0.1000 mg | ORAL_TABLET | Freq: Every day | ORAL | Status: DC

## 2016-03-27 MED ORDER — ALUM & MAG HYDROXIDE-SIMETH 200-200-20 MG/5ML PO SUSP: 30.0000 mL | ORAL | Status: DC | PRN

## 2016-03-27 MED ORDER — NAPROXEN 500 MG PO TABS: 500.0000 mg | ORAL_TABLET | Freq: Two times a day (BID) | ORAL | Status: DC | PRN

## 2016-03-27 MED ORDER — CLONIDINE HCL 0.1 MG PO TABS
0.1000 mg | ORAL_TABLET | Freq: Four times a day (QID) | ORAL | Status: AC
  Administered 2016-03-28 – 2016-03-30 (×9): 0.1 mg via ORAL
  Filled 2016-03-27 (×12): qty 1

## 2016-03-27 MED ORDER — DICYCLOMINE HCL 20 MG PO TABS
20.0000 mg | ORAL_TABLET | Freq: Four times a day (QID) | ORAL | Status: DC | PRN
  Administered 2016-03-28: 20 mg via ORAL
  Filled 2016-03-27: qty 1

## 2016-03-27 MED ORDER — ARIPIPRAZOLE 5 MG PO TABS
5.0000 mg | ORAL_TABLET | Freq: Every day | ORAL | Status: DC
  Administered 2016-03-28: 5 mg via ORAL
  Filled 2016-03-27 (×2): qty 1

## 2016-03-27 MED ORDER — METHOCARBAMOL 500 MG PO TABS
500.0000 mg | ORAL_TABLET | Freq: Three times a day (TID) | ORAL | Status: DC | PRN
  Administered 2016-03-27: 500 mg via ORAL
  Filled 2016-03-27: qty 1

## 2016-03-27 MED ORDER — CLONIDINE HCL 0.1 MG PO TABS
0.1000 mg | ORAL_TABLET | Freq: Four times a day (QID) | ORAL | Status: DC
  Administered 2016-03-27 (×2): 0.1 mg via ORAL
  Filled 2016-03-27 (×3): qty 1

## 2016-03-27 MED ORDER — CLONIDINE HCL 0.1 MG PO TABS
0.1000 mg | ORAL_TABLET | Freq: Every day | ORAL | Status: DC
  Filled 2016-03-27: qty 1

## 2016-03-27 NOTE — ED Provider Notes (Signed)
[facility]-EMERGENCY DEPT Provider Note   CSN: [id number] Arrival date & time: 03/27/16  1050     History   Chief Complaint Chief Complaint  Patient presents with  . Polysubstance Abuse  . Suicidal    HPI [name] is a 29 y.o. female.  The history is provided by the patient.  Mental Health Problem  Presenting symptoms: depression and suicidal thoughts   Degree of incapacity (severity):  Moderate Onset quality:  Gradual Duration:  6 months Timing:  Constant Progression:  Worsening Chronicity:  Chronic Context: drug abuse, noncompliance and stressful life event   Treatment compliance:  Untreated Relieved by:  Nothing Worsened by:  Drugs Ineffective treatments:  None tried Associated symptoms: anhedonia, anxiety, feelings of worthlessness and poor judgment   Risk factors: hx of mental illness     Past Medical History:  Diagnosis Date  . Asthma   . Atrial fibrillation (HCC)    LV function lower limit of normal   . Bipolar disorder (HCC)   . Depression   . GERD (gastroesophageal reflux disease)   . Left against medical advice 12/12/13  . Polysubstance dependence including opioid type drug, continuous use (HCC)    prior heroin overdose    Patient Active Problem List   Diagnosis Date Noted  . 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 Cellulitis 06/02/2014  . IV drug user 06/02/2014  . Abscess of right arm 06/02/2014  . Left against medical advice 12/14/2013  . Atrial fibrillation with RVR (HCC) 12/11/2013  . Atrial fibrillation with rapid ventricular response (HCC) 12/11/2013  . Overdose of heroin 10/19/2012  . POST-OP CARDIAC COMPLICATION 07/20/2009  . SYNCOPE 11/30/2008  . CARDIOMYOPATHY, SECONDARY 05/17/2008  . Atrial fibrillation (HCC) 05/17/2008  . Asthma 03/12/2008  . COUGH 03/12/2008    Past Surgical History:  Procedure Laterality Date  . catheter ablation     she reports prior ablatio (More than 5 years ago) by Dr  Rudolpho Sevin in [facility]    OB History    Gravida Para Term Preterm AB Living   5 2   2 3 2    SAB TAB Ectopic Multiple Live Births   1 2     2        Home Medications    Prior to Admission medications   Medication Sig Start Date End Date Taking? Authorizing Provider  albuterol (PROVENTIL HFA;VENTOLIN HFA) 108 (90 BASE) MCG/ACT inhaler Inhale 1-2 puffs into the lungs every 6 (six) hours as needed for wheezing or shortness of breath.    Historical Provider, MD  cephALEXin (KEFLEX) 500 MG capsule Take 1 capsule (500 mg total) by mouth 2 (two) times daily. 01/25/16   Tomasita Crumble, MD  diltiazem (CARDIZEM CD) 120 MG 24 hr capsule Take 120 mg by mouth daily.    Historical Provider, MD  ondansetron (ZOFRAN ODT) 4 MG disintegrating tablet Take 1 tablet (4 mg total) by mouth every 8 (eight) hours as needed for nausea or vomiting. 01/25/16   Tomasita Crumble, MD    Family History Family History  Problem Relation Age of Onset  . Asthma Brother   . Hypertension Mother   . Hypertension Maternal Grandmother     Social History Social History  Substance Use Topics  . Smoking status: Current Every Day Smoker    Packs/day: 0.50    Types: Cigarettes  . Smokeless tobacco: Current User     Comment: since 2002; smokes about 8 cig/day   . Alcohol use Yes  Comment: heavy on occasion     Allergies   Review of patient's allergies indicates no known allergies.   Review of Systems Review of Systems  Psychiatric/Behavioral: Positive for suicidal ideas. The patient is nervous/anxious.   All other systems reviewed and are negative.    Physical Exam Updated Vital Signs BP (!) 130/106 (BP Location: Left Arm)   Pulse 71   Temp 98.4 F (36.9 C) (Oral)   Resp 14   SpO2 93%   Physical Exam  Constitutional: She is oriented to person, place, and time. She appears well-developed and well-nourished. No distress.  HENT:  Head: Normocephalic.  Nose: Nose normal.  Eyes: Conjunctivae are normal.  Neck:  Neck supple. No tracheal deviation present.  Cardiovascular: Normal rate, regular rhythm and normal heart sounds.   Pulmonary/Chest: Effort normal and breath sounds normal. No respiratory distress.  Abdominal: Soft. She exhibits no distension. There is no tenderness.  Neurological: She is alert and oriented to person, place, and time.  Skin: Skin is warm and dry. Capillary refill takes less than 2 seconds.  Healing track marks of right arm without erythema or induration  Psychiatric: She has a normal mood and affect.  Vitals reviewed.    ED Treatments / Results  Labs (all labs ordered are listed, but only abnormal results are displayed) Labs Reviewed - No data to display  EKG  EKG Interpretation None       Radiology No results found.  Procedures Procedures (including critical care time)  Medications Ordered in ED Medications - No data to display   Initial Impression / Assessment and Plan / ED Course  I have reviewed the triage vital signs and the nursing notes.  Pertinent labs & imaging results that were available during my care of the patient were reviewed by me and considered in my medical decision making (see chart for details).  Clinical Course    30 y.o. female presents with suicidal thoughts with plan of intentional heroin overdose or shooting herself in the head. No access to guns. [facility]. Labs and urine drawn, restarted on home meds but no acute medical complaints.   MEDICALLY CLEAR FOR TRANSFER OR PSYCHIATRIC ADMISSION.   Final Clinical Impressions(s) / ED Diagnoses   Final diagnoses:  Polysubstance abuse  Suicidal thoughts    New Prescriptions New Prescriptions   No medications on file     [name], MD 03/27/16 1116

## 2016-03-27 NOTE — BH Assessment (Signed)
[facility] Dr. Jannifer FranklinAkintayo and Nanine MeansJamison Lord, DNP. Bed assignment is 306-2. [facility] will be ready for patient @ 2200. Support paperwork completed.

## 2016-03-27 NOTE — H&P (Signed)
Behavioral Health Medical Screening Exam  [name] is an 30 y.o. female who presents as a walk in reporting needing help with substance abuse and depression. Reports cutting as a teenager and feels "I'm starting to have bad thoughts again and I don't feel safe." She reports a medical history of afib and recently ran out of her Cardizem a day ago. Denies any current chest pain or shortness of breath. Patient also worries that she could be "having a touch of schizophrenia and paranoia. I think that I have been diagnosed with Bipolar Disorder." At this time the patient is meeting criteria for an inpatient admission. [facility] due to report of severe substance abuse and history of atrial fibrillation.   Total Time spent with patient: 20 minutes  Psychiatric Specialty Exam: Physical Exam  Constitutional: She is oriented to person, place, and time. She appears well-developed and well-nourished.  HENT:  Head: Normocephalic and atraumatic.  Right Ear: External ear normal.  Left Ear: External ear normal.  Eyes: Conjunctivae are normal. Pupils are equal, round, and reactive to light.  Neck: Normal range of motion.  Cardiovascular: Normal rate, regular rhythm, normal heart sounds and intact distal pulses.   Respiratory: Effort normal and breath sounds normal.  GI: Soft. Bowel sounds are normal.  Musculoskeletal: Normal range of motion.  Neurological: She is alert and oriented to person, place, and time.  Skin: Skin is warm and dry.    Review of Systems  Psychiatric/Behavioral: Positive for depression, substance abuse and suicidal ideas. The patient is nervous/anxious.     Blood pressure 118/79, pulse 76, temperature 98.8 F (37.1 C), resp. rate 18.There is no height or weight on file to calculate BMI.  General Appearance: Casual  Eye Contact:  Fair  Speech:  Clear and Coherent  Volume:  Normal  Mood:  Dysphoric  Affect:  Congruent  Thought  Process:  Coherent  Orientation:  Full (Time, Place, and Person)  Thought Content:  Concerns about drug use, worsening depression   Suicidal Thoughts:  Yes.  with intent/plan  Homicidal Thoughts:  No  Memory:  Immediate;   Good Recent;   Good Remote;   Good  Judgement:  Poor  Insight:  Present  Psychomotor Activity:  Restlessness  Concentration: Concentration: Fair and Attention Span: Fair  Recall:  Good  Fund of Knowledge:Good  Language: Good  Akathisia:  No  Handed:  Right  AIMS (if indicated):     Assets:  Communication Skills Desire for Improvement Leisure Time Resilience Social Support  Sleep:       Musculoskeletal: Strength & Muscle Tone: within normal limits Gait & Station: normal Patient leans: N/A  Blood pressure 118/79, pulse 76, temperature 98.8 F (37.1 C), resp. rate 18.  Recommendations:  Based on my evaluation the patient does not appear to have an emergency medical condition.  [name], Judith Brecheisen, NP 03/27/2016, 10:01 AM

## 2016-03-27 NOTE — ED Notes (Signed)
[facility] called for patient transport and reports that nearest driver was 1 hour away. [facility]

## 2016-03-27 NOTE — ED Triage Notes (Addendum)
Pt c/o ETOH and Heroin abuse, thoughts of self-harm(cutting), and intermittent SI w/ plan to shoot self in head or OD x 6 months to 1 year and HI toward "the people that took my kid away" recently.  Pt reports several recent deaths in immediate support system (sister, fiance, and dogs).  Pt reports inermittent auditory and visual hallucinations, "but nothing recently."  Hx of depression and bipolar.  Last use was last night.        Hx of afib and ran out of Cardizem x 2 days ago.  Denies chest pain and SOB.     Pt reported to EDP that her anxiety has "been really bad."  Sts "I can't even make a phone call because I'm so anxious."

## 2016-03-27 NOTE — ED Notes (Signed)
Pt transported to [facility] by [facility] transportation service for continuation of specialized care. Belongings given to driver after patient signed for them. Pt left in no acute distress. 

## 2016-03-27 NOTE — Progress Notes (Signed)
03/27/16 1358:  LRT went to offer activities, pt was sleep.   Caroll RancherMarjette Lytle [place], LRT/CTRS

## 2016-03-27 NOTE — ED Notes (Signed)
Pt oriented to room and unit.  Pt is depressed but denies S/I at this time.  She complains of anxiety.  She wishes to get started back on psych meds but does not remember what worked in the past.  15 minute checks and video monitoring in place.
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
## 2016-03-27 NOTE — Progress Notes (Signed)
Entered in d/c instructions  Please use the resources provided to you in emergency room by case manager to assist you're your choice of doctor for follow up     These [place] uninsured resources provide possible primary care providers, resources for discounted medications, housing, dental resources, affordable care act information, plus other resources for [place]    Instructions: A referral for you has been sent to [employer] [facility] community care network if you have not received a call in 3 days you may contact them Call Scherry RanKaren Andrianos at [phone] Tuesday-Friday [email or web address] Use Goodrx.com to medication discounts

## 2016-03-27 NOTE — ED Notes (Signed)
[facility] to overdose or shoot self in head. Patient currently denies AVH at this time. Plan of care discussed with patient. Patient voices no complaints or concerns at this time. Encouragement and support provided and safety maintain. Q 15 min safety checks remain in place and video monitoring.

## 2016-03-27 NOTE — BH Assessment (Signed)
[facility]  Per Fransisca KaufmannLaura Davis, NP - patient meets criteria for inpatient hospitalization.

## 2016-03-27 NOTE — Progress Notes (Signed)
Pt noted with family planning medicaid and nursing note indicated pt with concern with getting her cardizem filled ED CM spoke with pt who confirms not pcp but is [facility] by a new staff member [facility]aren [facility] after I was seen by another lady. I can't remember her name"   CM encouraged use of goodrx.com to get the best price and assistance with her cardizem  CM discussed and provided written information to assist pt with determining choice for uninsured accepting pcps, discussed the importance of pcp vs EDP services for f/u care, www.needymeds.org, www.goodrx.com, discounted pharmacies and other [place] resources such as [facility] , [facility], affordable care act, financial assistance, uninsured dental services, [place] med assist, DSS and  health department  Reviewed resources for [place] uninsured accepting pcps like [facility], family medicine at [address], [facility] [place], [facility], local urgent care centers, [facility], [facility] family practice, general medical clinics, family services of the [place], [facility] urgent care plus others, medication resources, [facility] out patient pharmacies and housing Pt voiced understanding and appreciation of resources provided   Provided P4CC contact information Pt agreed to a referral Cm completed referral Pt to be contact by [facility] clinical liaison

## 2016-03-27 NOTE — ED Notes (Signed)
Bed: WLPT4 Expected date:  Expected time:  Means of arrival:  Comments: 

## 2016-03-27 NOTE — ED Notes (Signed)
Pt is changed into scrubs and belongings collected. Pt was wanded by security

## 2016-03-27 NOTE — ED Notes (Signed)
Bed: W[facility]43 Expected date:  Expected time:  Means of arrival:  Comments: Triage 4 

## 2016-03-28 ENCOUNTER — Encounter (HOSPITAL_COMMUNITY): Payer: Self-pay | Admitting: *Deleted

## 2016-03-28 DIAGNOSIS — Z79899 Other long term (current) drug therapy: Secondary | ICD-10-CM

## 2016-03-28 DIAGNOSIS — F1123 Opioid dependence with withdrawal: Secondary | ICD-10-CM

## 2016-03-28 LAB — URINE MICROSCOPIC-ADD ON: RBC / HPF: NONE SEEN RBC/hpf (ref 0–5)

## 2016-03-28 LAB — LIPID PANEL
Cholesterol: 124 mg/dL (ref 0–200)
HDL: 28 mg/dL — ABNORMAL LOW (ref >40)
LDL CALC: 71 mg/dL (ref 0–99)
TRIGLYCERIDES: 123 mg/dL (ref <150)
Total CHOL/HDL Ratio: 4.4 RATIO
VLDL: 25 mg/dL (ref 0–40)

## 2016-03-28 LAB — URINALYSIS, ROUTINE W REFLEX MICROSCOPIC
BILIRUBIN URINE: NEGATIVE
Glucose, UA: NEGATIVE mg/dL
HGB URINE DIPSTICK: NEGATIVE
KETONES UR: NEGATIVE mg/dL
NITRITE: NEGATIVE
Protein, ur: NEGATIVE mg/dL
SPECIFIC GRAVITY, URINE: 1.011 (ref 1.005–1.030)
pH: 7 (ref 5.0–8.0)

## 2016-03-28 LAB — PREGNANCY, URINE: PREG TEST UR: NEGATIVE

## 2016-03-28 LAB — TSH: TSH: 4.344 u[IU]/mL (ref 0.350–4.500)

## 2016-03-28 MED ORDER — NICOTINE 21 MG/24HR TD PT24
21.0000 mg | MEDICATED_PATCH | Freq: Every day | TRANSDERMAL | Status: DC
  Administered 2016-03-28 – 2016-03-31 (×4): 21 mg via TRANSDERMAL
  Filled 2016-03-28 (×8): qty 1

## 2016-03-28 MED ORDER — QUETIAPINE FUMARATE 50 MG PO TABS
50.0000 mg | ORAL_TABLET | Freq: Every day | ORAL | Status: DC
  Administered 2016-03-28 – 2016-03-30 (×3): 50 mg via ORAL
  Filled 2016-03-28 (×8): qty 1

## 2016-03-28 MED ORDER — LIDOCAINE 5 % EX PTCH
1.0000 | MEDICATED_PATCH | Freq: Every day | CUTANEOUS | Status: DC
  Administered 2016-03-28 – 2016-03-31 (×4): 1 via TRANSDERMAL
  Filled 2016-03-28 (×8): qty 1

## 2016-03-28 MED ORDER — LORAZEPAM 1 MG PO TABS
1.0000 mg | ORAL_TABLET | Freq: Four times a day (QID) | ORAL | Status: AC | PRN
  Administered 2016-03-29 – 2016-03-30 (×2): 1 mg via ORAL
  Filled 2016-03-28 (×3): qty 1

## 2016-03-28 NOTE — Plan of Care (Signed)
Problem: Coping: Goal: Ability to cope will improve Outcome: Progressing Pt denies SI at this time   

## 2016-03-28 NOTE — Progress Notes (Addendum)
D: Pt. is up and visible in the room, eating a snack while resting in bed. Denies having any SI/HI/AVH/Pain at this time. Pt. presents with an anxious affect and mood. Pt. states " I have difficulty sleeping at night". Pt. is cooperative and pleasant with interaction. Snack and drink was offered, both were accepted and provided.   A: Encouragement and support given. Meds. ordered and given. PRN zofran given for N/V complaint. EKG and Labs in the a.m.   R: Safety maintained with Q 15 checks. Continues to follow treatment plan and will monitor closely. No questions/concerns at this time.

## 2016-03-28 NOTE — Progress Notes (Signed)
Patient ID: [name], female   DOB: 1985/06/28, 30 y.o.   MRN: [id number]   Writer spoke with MD Mckinley Jewelates about patient not having a pregnancy test. Per MD Mckinley Jewelates, give scheduled medications, some medications were discontinued.

## 2016-03-28 NOTE — Tx Team (Signed)
Initial Treatment Plan 03/28/2016 12:20 AM Judith LovelessVictoria Lorraine Branch [id number]    PATIENT STRESSORS: Health problems Loss of custody of children Marital or family conflict Occupational concerns Substance abuse   PATIENT STRENGTHS: Ability for insight Average or above average intelligence Communication skills Motivation for treatment/growth Supportive family/friends   PATIENT IDENTIFIED PROBLEMS: At risk for suicide  Substance Abuse  "To get clean"  "Get on effective medications"               DISCHARGE CRITERIA:  Ability to meet basic life and health needs Improved stabilization in mood, thinking, and/or behavior Motivation to continue treatment in a less acute level of care Need for constant or close observation no longer present Verbal commitment to aftercare and medication compliance Withdrawal symptoms are absent or subacute and managed without 24-hour nursing intervention  PRELIMINARY DISCHARGE PLAN: Attend 12-step recovery group Outpatient therapy Return to previous living arrangement  PATIENT/FAMILY INVOLVEMENT: This treatment plan has been presented to and reviewed with the patient, Judith LovelessVictoria Lorraine Branch.  The patient and family have been given the opportunity to ask questions and make suggestions.  Carleene OverlieMiddleton, Jsoeph Podesta P, RN 03/28/2016, 12:20 AM

## 2016-03-28 NOTE — Progress Notes (Signed)
D: Pt denies SI/HI/AVH. Pt is pleasant and cooperative. Pt presents as somatic at times, but pt is appropriate. Pt seen in the dayroom this evening interacting with peers.   A: Pt was offered support and encouragement. Pt was given scheduled medications. Pt was encourage to attend groups. Q 15 minute checks were done for safety.   R:Pt attends groups and interacts well with peers and staff. Pt is taking medication. Pt has no complaints.Pt receptive to treatment and safety maintained on unit.
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
## 2016-03-28 NOTE — Plan of Care (Signed)
Problem: Safety: Goal: Periods of time without injury will increase Outcome: Progressing Pt. remains a low fall risk, denies SI/HI, Q 15 checks for safety.
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
## 2016-03-28 NOTE — Tx Team (Signed)
Interdisciplinary Treatment and Diagnostic Plan Update  03/28/2016 Time of Session: 9:30AM Arin Vanosdol MRN: [id number]  Principal Diagnosis: Opiate dependence (HCC)  Secondary Diagnoses: Principal Problem:   Opiate dependence (HCC) Active Problems:   Atrial fibrillation (HCC)   Substance induced mood disorder (HCC)   Current Medications:  Current Facility-Administered Medications  Medication Dose Route Frequency Provider Last Rate Last Dose  . acetaminophen (TYLENOL) tablet 650 mg  650 mg Oral Q6H PRN Kerry Hough, PA-C      . albuterol (PROVENTIL HFA;VENTOLIN HFA) 108 (90 Base) MCG/ACT inhaler 1-2 puff  1-2 puff Inhalation Q6H PRN Kerry Hough, PA-C      . alum & mag hydroxide-simeth (MAALOX/MYLANTA) 200-200-20 MG/5ML suspension 30 mL  30 mL Oral Q4H PRN Kerry Hough, PA-C      . cloNIDine (CATAPRES) tablet 0.1 mg  0.1 mg Oral QID Kerry Hough, PA-C   0.1 mg at 03/28/16 1208   Followed by  . [START ON 03/30/2016] cloNIDine (CATAPRES) tablet 0.1 mg  0.1 mg Oral BH-qamhs Spencer E Simon, PA-C       Followed by  . [START ON 04/02/2016] cloNIDine (CATAPRES) tablet 0.1 mg  0.1 mg Oral QAC breakfast Kerry Hough, PA-C      . dicyclomine (BENTYL) tablet 20 mg  20 mg Oral Q6H PRN Kerry Hough, PA-C      . hydrOXYzine (ATARAX/VISTARIL) tablet 25 mg  25 mg Oral Q6H PRN Kerry Hough, PA-C      . lidocaine (LIDODERM) 5 % 1 patch  1 patch Transdermal Daily Acquanetta Sit, MD   1 patch at 03/28/16 1126  . loperamide (IMODIUM) capsule 2-4 mg  2-4 mg Oral PRN Kerry Hough, PA-C      . magnesium hydroxide (MILK OF MAGNESIA) suspension 30 mL  30 mL Oral Daily PRN Kerry Hough, PA-C      . methocarbamol (ROBAXIN) tablet 500 mg  500 mg Oral Q8H PRN Kerry Hough, PA-C   500 mg at 03/28/16 0856  . naproxen (NAPROSYN) tablet 500 mg  500 mg Oral BID PRN Kerry Hough, PA-C   500 mg at 03/28/16 0856  . nicotine (NICODERM CQ - dosed in mg/24 hours) patch 21  mg  21 mg Transdermal Daily Kerry Hough, PA-C   21 mg at 03/28/16 0853  . ondansetron (ZOFRAN-ODT) disintegrating tablet 4 mg  4 mg Oral Q6H PRN Kerry Hough, PA-C   4 mg at 03/28/16 0645  . QUEtiapine (SEROQUEL) tablet 50 mg  50 mg Oral QHS Acquanetta Sit, MD      . traZODone (DESYREL) tablet 50 mg  50 mg Oral QHS,MR X 1 Kerry Hough, PA-C   50 mg at 03/27/16 2332   PTA Medications: Prescriptions Prior to Admission  Medication Sig Dispense Refill Last Dose  . albuterol (PROVENTIL HFA;VENTOLIN HFA) 108 (90 BASE) MCG/ACT inhaler Inhale 1-2 puffs into the lungs every 6 (six) hours as needed for wheezing or shortness of breath.   03/26/2016 at Unknown time  . albuterol (PROVENTIL) (2.5 MG/3ML) 0.083% nebulizer solution Inhale 2.5 mg into the lungs every 6 (six) hours as needed for shortness of breath or wheezing.   Past Week at Unknown time  . diltiazem (CARDIZEM CD) 120 MG 24 hr capsule Take 120 mg by mouth daily.   Past Week at Unknown time  . ondansetron (ZOFRAN ODT) 4 MG disintegrating tablet Take 1 tablet (4 mg total) by mouth every  8 (eight) hours as needed for nausea or vomiting. (Patient not taking: Reported on 03/27/2016) 10 tablet 0 Completed Course at Unknown time    Patient Stressors: Health problems Loss of custody of children Marital or family conflict Occupational concerns Substance abuse  Patient Strengths: Ability for insight Average or above average intelligence Communication skills Motivation for treatment/growth Supportive family/friends  Treatment Modalities: Medication Management, Group therapy, Case management,  1 to 1 session with clinician, Psychoeducation, Recreational therapy.   Physician Treatment Plan for Primary Diagnosis: Opiate dependence (HCC) Long Term Goal(s): Improvement in symptoms so as ready for discharge Improvement in symptoms so as ready for discharge   Short Term Goals: Ability to demonstrate self-control will improve Ability  to maintain clinical measurements within normal limits will improve Compliance with prescribed medications will improve Ability to identify changes in lifestyle to reduce recurrence of condition will improve Ability to identify triggers associated with substance abuse/mental health issues will improve  Medication Management: Evaluate patient's response, side effects, and tolerance of medication regimen.  Therapeutic Interventions: 1 to 1 sessions, Unit Group sessions and Medication administration.  Evaluation of Outcomes: Progressing  Physician Treatment Plan for Secondary Diagnosis: Principal Problem:   Opiate dependence (HCC) Active Problems:   Atrial fibrillation (HCC)   Substance induced mood disorder (HCC)  Long Term Goal(s): Improvement in symptoms so as ready for discharge Improvement in symptoms so as ready for discharge   Short Term Goals: Ability to demonstrate self-control will improve Ability to maintain clinical measurements within normal limits will improve Compliance with prescribed medications will improve Ability to identify changes in lifestyle to reduce recurrence of condition will improve Ability to identify triggers associated with substance abuse/mental health issues will improve     Medication Management: Evaluate patient's response, side effects, and tolerance of medication regimen.  Therapeutic Interventions: 1 to 1 sessions, Unit Group sessions and Medication administration.  Evaluation of Outcomes: Progressing   RN Treatment Plan for Primary Diagnosis: Opiate dependence (HCC) Long Term Goal(s): Knowledge of disease and therapeutic regimen to maintain health will improve  Short Term Goals: Ability to remain free from injury will improve, Ability to disclose and discuss suicidal ideas and Ability to identify and develop effective coping behaviors will improve  Medication Management: RN will administer medications as ordered by provider, will assess and  evaluate patient's response and provide education to patient for prescribed medication. RN will report any adverse and/or side effects to prescribing provider.  Therapeutic Interventions: 1 on 1 counseling sessions, Psychoeducation, Medication administration, Evaluate responses to treatment, Monitor vital signs and CBGs as ordered, Perform/monitor CIWA, COWS, AIMS and Fall Risk screenings as ordered, Perform wound care treatments as ordered.  Evaluation of Outcomes: Progressing   LCSW Treatment Plan for Primary Diagnosis: Opiate dependence (HCC) Long Term Goal(s): Safe transition to appropriate next level of care at discharge, Engage patient in therapeutic group addressing interpersonal concerns.  Short Term Goals: Engage patient in aftercare planning with referrals and resources, Facilitate acceptance of mental health diagnosis and concerns, Facilitate patient progression through stages of change regarding substance use diagnoses and concerns and Identify triggers associated with mental health/substance abuse issues  Therapeutic Interventions: Assess for all discharge needs, 1 to 1 time with Social worker, Explore available resources and support systems, Assess for adequacy in community support network, Educate family and significant other(s) on suicide prevention, Complete Psychosocial Assessment, Interpersonal group therapy.  Evaluation of Outcomes: Progressing   Progress in Treatment: Attending groups: No. New to unit. Continuing to assess.  Participating in groups: No. Taking medication as prescribed: Yes. Toleration medication: Yes. Family/Significant other contact made: No, will contact:  family member if pt consents.  Patient understands diagnosis: Yes. Discussing patient identified problems/goals with staff: Yes. Medical problems stabilized or resolved: Yes. Denies suicidal/homicidal ideation: Yes. Issues/concerns per patient self-inventory: No. Other: n/a  New Short Term/Long  Term Goal(s): medication stabilization and aftercare (outpatient). Substance abuse treatment-likely outpatient.   Discharge Plan or Barriers: CSW assessing for appropriate referrals. Pt reports no current providers.   Reason for Continuation of Hospitalization: Anxiety Depression Medication stabilization Withdrawal symptoms  Estimated Length of Stay: 3-5 days   Attendees: Patient: 03/28/2016 12:36 PM  Physician: Dr. Mckinley Jewelates MD 03/28/2016 12:36 PM  Nursing: Caryl NeverKaren, Christa RN 03/28/2016 12:36 PM  RN Care Manager: Onnie BoerJennifer Clark, CM 03/28/2016 12:36 PM  Social Worker: Trula SladeHeather Smart, LCSW 03/28/2016 12:36 PM  Recreational Therapist:  03/28/2016 12:36 PM  Other:  03/28/2016 12:36 PM  Other:  03/28/2016 12:36 PM  Other: 03/28/2016 12:36 PM    Scribe for Treatment Team: Ledell PeoplesHeather N Smart, LCSW 03/28/2016 12:36 PM

## 2016-03-28 NOTE — Progress Notes (Signed)
Recreation Therapy Notes  Date: 03/28/16 Time: 0930 Location: [address] Dayroom  Group Topic: Stress Management  Goal Area(s) Addresses:  Patient will verbalize importance of using healthy stress management.  Patient will identify positive emotions associated with healthy stress management.   Intervention: Stress Management  Activity :  Peaceful Place.  LRT introduced the stress management technique of guided imagery.  LRT read script to engage patients in the technique.  Patients were to follow along as LRT read script.     Education:  Stress Management, Discharge Planning.   Education Outcome: Acknowledges edcuation/In group clarification offered/Needs additional education  Clinical Observations/Feedback:  Pt did not attend group.   Meshawn Oconnor, LRT/CTRS         Jaidence Geisler A 03/28/2016 12:27 PM 
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
## 2016-03-28 NOTE — [facility] Group Notes (Signed)
[facility] LCSW Group Therapy  03/28/2016 4:02 PM  Type of Therapy:  Group Therapy  Participation Level:  Active  Participation Quality:  Attentive  Affect:  Appropriate  Cognitive:  Appropriate  Insight:  Limited  Engagement in Therapy:  Improving  Modes of Intervention:  Discussion, Education, Exploration, Problem-solving, Rapport Building, Socialization and Support  Summary of Progress/Problems: Today's Topic: Overcoming Obstacles. Patients identified one short term goal and potential obstacles in reaching this goal. Patients processed barriers involved in overcoming these obstacles. Patients identified steps necessary for overcoming these obstacles and explored motivation (internal and external) for facing these difficulties head on. [name] was attentive and engaged during today's processing group. She shared that her biggest obstacle involves relapse potential and withdrawal symptoms. She is hoping to discharge Friday "to go to jail and complete my time." Patient reports that she is planning to return home with her mother afterward and [facility]. "Just trying to get things in my life back together." She continues to make progress in the group setting with improving insight.   [name] N Smart LCSW 03/28/2016, 4:02 PM

## 2016-03-28 NOTE — [facility] Suicide Risk Assessment (Signed)
[facility] Admission Suicide Risk Assessment   Nursing information obtained from:  Patient Demographic factors:  Caucasian, Unemployed Current Mental Status:  Suicidal ideation indicated by patient, Suicide plan, Plan includes specific time, place, or method, Self-harm thoughts, Self-harm behaviors Loss Factors:  Decrease in vocational status, Loss of significant relationship Historical Factors:  Prior suicide attempts, Family history of mental illness or substance abuse Risk Reduction Factors:  Living with another person, especially a relative, Positive social support  Total Time spent with patient: 30 minutes Principal Problem: Opiate dependence (HCC) Diagnosis:   Patient Active Problem List   Diagnosis Date Noted  . Major depressive disorder, recurrent severe without psychotic features (HCC) [F33.2] 03/27/2016  . Opiate dependence (HCC) [F11.20] 03/27/2016  . Substance induced mood disorder (HCC) [F19.94] 03/27/2016  . 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 Cellulitis [L03.90] 06/02/2014  . IV drug user [F19.90] 06/02/2014  . Abscess of right arm [L02.413] 06/02/2014  . Left against medical advice [Z53.20] 12/14/2013  . Atrial fibrillation with RVR (HCC) [I48.91] 12/11/2013  . Atrial fibrillation with rapid ventricular response (HCC) [I48.91] 12/11/2013  . Overdose of heroin [T40.1X1A] 10/19/2012  . POST-OP CARDIAC COMPLICATION [I51.9] 07/20/2009  . SYNCOPE [R55] 11/30/2008  . CARDIOMYOPATHY, SECONDARY [I42.9] 05/17/2008  . Atrial fibrillation (HCC) [I48.91] 05/17/2008  . Asthma [J45.909] 03/12/2008  . COUGH [R05] 03/12/2008   Subjective Data: Patient denies any current suicidal or homicidal ideation, plan or intent.  Continued Clinical Symptoms:  Alcohol Use Disorder Identification Test Final Score (AUDIT): 32 The "Alcohol Use Disorders Identification Test", Guidelines for Use in Primary Care, Second Edition.  World Science writer [facility]). Score  between 0-7:  no or low risk or alcohol related problems. Score between 8-15:  moderate risk of alcohol related problems. Score between 16-19:  high risk of alcohol related problems. Score 20 or above:  warrants further diagnostic evaluation for alcohol dependence and treatment.   CLINICAL FACTORS:   Panic Attacks Bipolar Disorder:   Bipolar II Alcohol/Substance Abuse/Dependencies Chronic Pain More than one psychiatric diagnosis Previous Psychiatric Diagnoses and Treatments Medical Diagnoses and Treatments/Surgeries   Musculoskeletal: Strength & Muscle Tone: within normal limits Gait & Station: normal Patient leans: N/A  Psychiatric Specialty Exam: Physical Exam  ROS  Blood pressure 98/62, pulse (!) 51, temperature 98.3 F (36.8 C), temperature source Oral, resp. rate 16, height 5\' 6"  (1.676 m), weight 52.2 kg (115 lb).Body mass index is 18.56 kg/m.   General Appearance: Casual  Eye Contact:  Good  Speech:  Clear and Coherent  Volume:  Normal  Mood:  Anxious and Dysphoric  Affect:  Appropriate and Congruent  Thought Process:  Coherent  Orientation:  Negative  Thought Content:  Negative  Suicidal Thoughts:  No  Homicidal Thoughts:  No  Memory:  Negative  Judgement:  Impaired  Insight:  Shallow  Psychomotor Activity:  Normal  Concentration:  Concentration: Good and Attention Span: Good  Recall:  Good  Fund of Knowledge:  Good  Language:  Good  Akathisia:  No  Handed:  Ambidextrous  AIMS (if indicated):     Assets:  Desire for Improvement Resilience  ADL's:  Intact  Cognition:  WNL  Sleep:  Number of Hours: 6      COGNITIVE FEATURES THAT CONTRIBUTE TO RISK:  None    SUICIDE RISK:   Mild:  Suicidal ideation of limited frequency, intensity, duration, and specificity.  There are no identifiable plans, no associated intent, mild dysphoria and related symptoms, good self-control (both objective  and subjective assessment), few other risk factors, and identifiable  protective factors, including available and accessible social support.   PLAN OF CARE: see PAA  I certify that inpatient services furnished can reasonably be expected to improve the patient's condition.  Acquanetta SitElizabeth Woods Oates, MD 03/28/2016, 12:00 PM

## 2016-03-28 NOTE — Progress Notes (Signed)
Patient ID: Judith LovelessVictoria Lorraine Ayyad, female   DOB: [birth year], 30 y.o.   MRN: [id number]  DAR: Pt. Denies SI/HI and A/V Hallucinations. She reports sleep is good, appetite is poor, energy level is low, and concentration is good. She rates depression 8/10, hopelessness 7/10, and and anxiety 9/10. Support and encouragement provided to the patient. Scheduled and PRN medications administered to patient throughout the day to manage patient's withdrawal symptoms which include nausea, agitation, irritability, nasal stuffiness, and chilling. She reports lower back pain as well and received PRN Robaxin with some relief. Patient is seen in the milieu and is interacting with peers. Q15 minute checks are maintained for safety.

## 2016-03-29 LAB — HEMOGLOBIN A1C
HEMOGLOBIN A1C: 4.8 % (ref 4.8–5.6)
Mean Plasma Glucose: 91 mg/dL

## 2016-03-29 LAB — PROLACTIN: Prolactin: 16.9 ng/mL (ref 4.8–23.3)

## 2016-03-29 MED ORDER — HYDROXYZINE HCL 50 MG PO TABS
50.0000 mg | ORAL_TABLET | Freq: Four times a day (QID) | ORAL | Status: DC | PRN
  Administered 2016-03-29 – 2016-03-31 (×5): 50 mg via ORAL
  Filled 2016-03-29 (×5): qty 1

## 2016-03-29 MED ORDER — ASPIRIN 81 MG PO CHEW
81.0000 mg | CHEWABLE_TABLET | Freq: Every day | ORAL | Status: DC
  Administered 2016-03-29 – 2016-03-31 (×3): 81 mg via ORAL
  Filled 2016-03-29 (×8): qty 1

## 2016-03-29 NOTE — [facility] Group Notes (Signed)
[facility] LCSW Group Therapy  03/29/2016 2:49 PM  Type of Therapy:  Group Therapy  Participation Level:  Active  Participation Quality:  Attentive  Affect:  Appropriate  Cognitive:  Alert and Oriented  Insight:  Improving  Engagement in Therapy:  Improving  Modes of Intervention:  Education, Exploration, Limit-setting, Problem-solving, Rapport Building, Socialization and Support  Summary of Progress/Problems: [facility] Speaker came to talk about his personal journey with substance abuse and addiction. The pt processed ways by which to relate to the speaker. MHA speaker provided handouts and educational information pertaining to groups and services [facility] by [facility].   Cearra Portnoy N Smart LCSW 03/29/2016, 2:49 PM

## 2016-03-29 NOTE — Plan of Care (Signed)
Problem: Medication: Goal: Compliance with prescribed medication regimen will improve Outcome: Progressing Patient has taken her medications as prescribed and appropriately.

## 2016-03-29 NOTE — Progress Notes (Signed)
Patient with history of A. fib in the past, [facility], reviewed her EKGs over last 2 years, no evidence of recurrent A. Fib, patient was thought to be in A. fib in the past secondary to abuse of amphetamine, patient with chads2 vasc score of 1 (female), with known history of IV drug abuse, polysubstance abuse and severe noncompliance, and no evidence of A. fib recurrence for few years, so recommendation is to hold on full anticoagulation, may start an aspirin 81 mg oral daily. Aletheia Tangredi MD

## 2016-03-29 NOTE — Progress Notes (Signed)
Medication change and update on consult with hospitalist  Patient was consulted to the hospitalist about whether she should be on anticoagulation and Cardizem. After reviewing the case the hospitalist states that she should not be on anticoagulation and Cardizem at this time but should start on an aspirin.

## 2016-03-29 NOTE — [facility] Group Notes (Signed)
[facility] Group Notes:  (Nursing/MHT/Case Management/Adjunct)  Date:  03/29/2016  Time:  1:46 PM  Type of Therapy:  Nurse Education  Participation Level:  Active  Participation Quality:  Appropriate and Attentive  Affect:  Appropriate  Cognitive:  Alert and Appropriate  Insight:  Appropriate  Engagement in Group:  Engaged  Modes of Intervention:  Discussion and Problem-solving  Summary of Progress/Problems: Patient attended nurse education group. She was able to report her goal for today as, "Get through the withdrawals and get ou of here". She was appropriate.   Almira Barenny G Dilana Mcphie 03/29/2016, 1:46 PM

## 2016-03-29 NOTE — Progress Notes (Signed)
Data. Patient denies SI/HI/AVH.  Patient interacting well with staff and other patients. Affect is bright with interaction. She reports depression and anxiety and, "Wanting to use". On her self assessment she reports 4/10 for depression and hopelessness and 8/10 for anxiety. Her goal for today is: "Getting my meds straightened out and keep fighting for my sobriety".  Action. Emotional support and encouragement offered. Education provided on medication, indications and side effect. Q 15 minute checks done for safety. Response. Safety on the unit maintained through 15 minute checks.  Medications taken as prescribed. Attended groups. Remained calm and appropriate through out shift.

## 2016-03-29 NOTE — Progress Notes (Signed)
[facility] MD Progress Note  03/29/2016 11:35 AM  Patient Active Problem List   Diagnosis Date Noted  . Major depressive disorder, recurrent severe without psychotic features (HCC) 03/27/2016  . Opiate dependence (HCC) 03/27/2016  . Substance induced mood disorder (HCC) 03/27/2016  . 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 Cellulitis 06/02/2014  . IV drug user 06/02/2014  . Abscess of right arm 06/02/2014  . Left against medical advice 12/14/2013  . Atrial fibrillation with RVR (HCC) 12/11/2013  . Atrial fibrillation with rapid ventricular response (HCC) 12/11/2013  . Overdose of heroin 10/19/2012  . POST-OP CARDIAC COMPLICATION 07/20/2009  . SYNCOPE 11/30/2008  . CARDIOMYOPATHY, SECONDARY 05/17/2008  . Atrial fibrillation (HCC) 05/17/2008  . Asthma 03/12/2008  . COUGH 03/12/2008    Diagnosis: opiate use disorder severe  Subjective: Pt c/o of increased anxiety and we discussed how this is related to withdrawal. Pt denies any SI/HI today. Pt is aware she should be on anticoagulation and I will discuss with hospitalist today.  Objective:  Well developed well nourished female in no apparent distress. No visible signs of withdrawal seen. Speech and motor within normal limits. Mood is described as alright and affect is congruent. Thought processes linear and goal-directed thought content denies any suicidal or homicidal ideation, plan or intent does endorse some anxiety symptoms alert and oriented 3 judgment and insight are somewhat limited IQ appears in average range    Current Facility-Administered Medications (Cardiovascular):  .  cloNIDine (CATAPRES) tablet 0.1 mg **FOLLOWED BY** [START ON 03/30/2016] cloNIDine (CATAPRES) tablet 0.1 mg **FOLLOWED BY** [START ON 04/02/2016] cloNIDine (CATAPRES) tablet 0.1 mg   Current Facility-Administered Medications (Respiratory):  .  albuterol (PROVENTIL HFA;VENTOLIN HFA) 108 (90 Base) MCG/ACT inhaler 1-2 puff   Current  Facility-Administered Medications (Analgesics):  .  acetaminophen (TYLENOL) tablet 650 mg .  naproxen (NAPROSYN) tablet 500 mg     Current Facility-Administered Medications (Other):  .  alum & mag hydroxide-simeth (MAALOX/MYLANTA) 200-200-20 MG/5ML suspension 30 mL .  dicyclomine (BENTYL) tablet 20 mg .  hydrOXYzine (ATARAX/VISTARIL) tablet 50 mg .  lidocaine (LIDODERM) 5 % 1 patch .  loperamide (IMODIUM) capsule 2-4 mg .  LORazepam (ATIVAN) tablet 1 mg .  magnesium hydroxide (MILK OF MAGNESIA) suspension 30 mL .  methocarbamol (ROBAXIN) tablet 500 mg .  nicotine (NICODERM CQ - dosed in mg/24 hours) patch 21 mg .  ondansetron (ZOFRAN-ODT) disintegrating tablet 4 mg .  QUEtiapine (SEROQUEL) tablet 50 mg .  traZODone (DESYREL) tablet 50 mg  No current outpatient prescriptions on file.  Vital Signs:Blood pressure 117/71, pulse (!) 54, temperature 98.4 F (36.9 C), temperature source Oral, resp. rate 16, height 5\' 6"  (1.676 m), weight 52.2 kg (115 lb).    Lab Results:  Results for orders placed or performed during the hospital encounter of 03/27/16 (from the past 48 hour(s))  Hemoglobin A1c     Status: None   Collection Time: 03/28/16  6:29 AM  Result Value Ref Range   Hgb A1c MFr Bld 4.8 4.8 - 5.6 %    Comment: (NOTE)         Pre-diabetes: 5.7 - 6.4         Diabetes: >6.4         Glycemic control for adults with diabetes: <7.0    Mean Plasma Glucose 91 mg/dL    Comment: (NOTE) Performed At: [facility] [address] [place], [place] [id number] Mila Homer MD WU:[id number] Performed at [facility]  Lipid panel     Status: Abnormal   Collection Time: 03/28/16  6:29 AM  Result Value Ref Range   Cholesterol 124 0 - 200 mg/dL   Triglycerides 161123 <096<150 mg/dL   HDL 28 (L) >04>40 mg/dL   Total CHOL/HDL Ratio 4.4 RATIO   VLDL 25 0 - 40 mg/dL   LDL Cholesterol 71 0 - 99 mg/dL    Comment:        Total Cholesterol/HDL:CHD Risk Coronary Heart  Disease Risk Table                     Men   Women  1/2 Average Risk   3.4   3.3  Average Risk       5.0   4.4  2 X Average Risk   9.6   7.1  3 X Average Risk  23.4   11.0        Use the calculated Patient Ratio above and the CHD Risk Table to determine the patient's CHD Risk.        ATP III CLASSIFICATION (LDL):  <100     mg/dL   Optimal  540-981100-129  mg/dL   Near or Above                    Optimal  130-159  mg/dL   Borderline  191-478160-189  mg/dL   High  >295>190     mg/dL   Very High Performed at [facility]   TSH     Status: None   Collection Time: 03/28/16  6:29 AM  Result Value Ref Range   TSH 4.344 0.350 - 4.500 uIU/mL    Comment: Performed by a 3rd Generation assay with a functional sensitivity of <=0.01 uIU/mL. Performed at [facility]   Prolactin     Status: None   Collection Time: 03/28/16  6:29 AM  Result Value Ref Range   Prolactin 16.9 4.8 - 23.3 ng/mL    Comment: (NOTE) Performed At: [facility] [address] [place], [place] [id number] Mila HomerHancock William F MD QI:[id number]:[phone] Performed at [facility]   Pregnancy, urine     Status: None   Collection Time: 03/28/16  9:10 AM  Result Value Ref Range   Preg Test, Ur NEGATIVE NEGATIVE    Comment:        THE SENSITIVITY OF THIS METHODOLOGY IS >20 mIU/mL. Performed at [facility]   Urinalysis, Routine w reflex microscopic (not at [facility])     Status: Abnormal   Collection Time: 03/28/16  9:10 AM  Result Value Ref Range   Color, Urine AMBER (A) YELLOW    Comment: BIOCHEMICALS MAY BE AFFECTED BY COLOR   APPearance CLOUDY (A) CLEAR   Specific Gravity, Urine 1.011 1.005 - 1.030   pH 7.0 5.0 - 8.0   Glucose, UA NEGATIVE NEGATIVE mg/dL   Hgb urine dipstick NEGATIVE NEGATIVE   Bilirubin Urine NEGATIVE NEGATIVE   Ketones, ur NEGATIVE NEGATIVE mg/dL   Protein, ur NEGATIVE NEGATIVE mg/dL   Nitrite NEGATIVE NEGATIVE   Leukocytes, UA TRACE (A) NEGATIVE     Comment: Performed at [facility]  Urine microscopic-add on     Status: Abnormal   Collection Time: 03/28/16  9:10 AM  Result Value Ref Range   Squamous Epithelial / LPF 0-5 (A) NONE SEEN   WBC, UA 0-5 0 - 5 WBC/hpf   RBC / HPF NONE SEEN 0 - 5  RBC/hpf   Bacteria, UA FEW (A) NONE SEEN    Comment: Performed at [facility]    Physical Findings: AIMS: Facial and Oral Movements Muscles of Facial Expression: None, normal Lips and Perioral Area: None, normal Jaw: None, normal Tongue: None, normal,Extremity Movements Upper (arms, wrists, hands, fingers): None, normal Lower (legs, knees, ankles, toes): None, normal, Trunk Movements Neck, shoulders, hips: None, normal, Overall Severity Severity of abnormal movements (highest score from questions above): None, normal Incapacitation due to abnormal movements: None, normal Patient's awareness of abnormal movements (rate only patient's report): No Awareness, Dental Status Current problems with teeth and/or dentures?: No Does patient usually wear dentures?: No  CIWA:  CIWA-Ar Total: 6 COWS:  COWS Total Score: 5   Assessment/Plan: Patient appears to be detoxing without incident from opiates. She does relate some increased anxiety and we will increase her Vistaril to 50 mg from 25 and observe if this is helpful. Per social work patient may want to leave soon as she has to short jail sentences she is anxious to complete. We will consult with the hospitalist about restarting her on anticoagulation.  Acquanetta Sit, MD 03/29/2016, 11:35 AM

## 2016-03-29 NOTE — [facility] Suicide Risk Assessment (Signed)
[facility] INPATIENT:  Family/Significant Other Suicide Prevention Education  Suicide Prevention Education:  Education Completed; Daryel Geraldammy Stewart (pt's mother) [phone] has been identified by the patient as the family member/significant other with whom the patient will be residing, and identified as the person(s) who will aid the patient in the event of a mental health crisis (suicidal ideations/suicide attempt).  With written consent from the patient, the family member/significant other has been provided the following suicide prevention education, prior to the and/or following the discharge of the patient.  The suicide prevention education provided includes the following:  Suicide risk factors  Suicide prevention and interventions  National Suicide Hotline telephone number  [facility] assessment telephone number  [facility] Emergency Assistance 911  [facility] and/or Residential Mobile Crisis Unit telephone number  Request made of family/significant other to:  Remove weapons (e.g., guns, rifles, knives), all items previously/currently identified as safety concern.    Remove drugs/medications (over-the-counter, prescriptions, illicit drugs), all items previously/currently identified as a safety concern.  The family member/significant other verbalizes understanding of the suicide prevention education information provided.  The family member/significant other agrees to remove the items of safety concern listed above.  Alitzel Cookson N Smart LCSW 03/29/2016, 4:06 PM

## 2016-03-30 ENCOUNTER — Encounter (HOSPITAL_COMMUNITY): Payer: Self-pay

## 2016-03-30 DIAGNOSIS — F1120 Opioid dependence, uncomplicated: Secondary | ICD-10-CM

## 2016-03-30 DIAGNOSIS — F1994 Other psychoactive substance use, unspecified with psychoactive substance-induced mood disorder: Secondary | ICD-10-CM

## 2016-03-30 DIAGNOSIS — I480 Paroxysmal atrial fibrillation: Secondary | ICD-10-CM

## 2016-03-30 LAB — BASIC METABOLIC PANEL
Anion gap: 11 (ref 5–15)
BUN: 8 mg/dL (ref 6–20)
CHLORIDE: 102 mmol/L (ref 101–111)
CO2: 23 mmol/L (ref 22–32)
Calcium: 9.7 mg/dL (ref 8.9–10.3)
Creatinine, Ser: 0.59 mg/dL (ref 0.44–1.00)
GFR calc Af Amer: >60 mL/min (ref >60)
GFR calc non Af Amer: >60 mL/min (ref >60)
Glucose, Bld: 112 mg/dL — ABNORMAL HIGH (ref 65–99)
POTASSIUM: 4.4 mmol/L (ref 3.5–5.1)
Sodium: 136 mmol/L (ref 135–145)

## 2016-03-30 LAB — TROPONIN I

## 2016-03-30 LAB — CBC WITH DIFFERENTIAL/PLATELET
Basophils Absolute: 0 10*3/uL (ref 0.0–0.1)
Basophils Relative: 0 %
Eosinophils Absolute: 0 10*3/uL (ref 0.0–0.7)
Eosinophils Relative: 0 %
HCT: 53.2 % — ABNORMAL HIGH (ref 36.0–46.0)
HEMOGLOBIN: 18.5 g/dL — AB (ref 12.0–15.0)
LYMPHS ABS: 1.9 10*3/uL (ref 0.7–4.0)
LYMPHS PCT: 26 %
MCH: 32.3 pg (ref 26.0–34.0)
MCHC: 34.8 g/dL (ref 30.0–36.0)
MCV: 92.8 fL (ref 78.0–100.0)
Monocytes Absolute: 0.6 10*3/uL (ref 0.1–1.0)
Monocytes Relative: 8 %
NEUTROS PCT: 66 %
Neutro Abs: 4.7 10*3/uL (ref 1.7–7.7)
Platelets: 242 10*3/uL (ref 150–400)
RBC: 5.73 MIL/uL — AB (ref 3.87–5.11)
RDW: 14.6 % (ref 11.5–15.5)
WBC: 7.2 10*3/uL (ref 4.0–10.5)

## 2016-03-30 LAB — GLUCOSE, CAPILLARY: Glucose-Capillary: 119 mg/dL — ABNORMAL HIGH (ref 65–99)

## 2016-03-30 MED ORDER — ONDANSETRON 4 MG PO TBDP
4.0000 mg | ORAL_TABLET | Freq: Once | ORAL | Status: AC
  Administered 2016-03-30: 4 mg via ORAL
  Filled 2016-03-30: qty 1

## 2016-03-30 MED ORDER — ONDANSETRON HCL 4 MG/2ML IJ SOLN
4.0000 mg | Freq: Once | INTRAMUSCULAR | Status: AC
  Administered 2016-03-30: 4 mg via INTRAVENOUS
  Filled 2016-03-30: qty 2

## 2016-03-30 MED ORDER — CLONAZEPAM 0.5 MG PO TABS
1.0000 mg | ORAL_TABLET | Freq: Once | ORAL | Status: AC
  Administered 2016-03-30: 1 mg via ORAL
  Filled 2016-03-30: qty 2

## 2016-03-30 MED ORDER — DILTIAZEM HCL ER COATED BEADS 120 MG PO CP24: 120.0000 mg | ORAL_CAPSULE | Freq: Every day | ORAL | 1 refills | Status: DC

## 2016-03-30 NOTE — ED Provider Notes (Addendum)
[facility]-EMERGENCY DEPT Provider Note   CSN: [id number] Arrival date & time: 03/30/16  4098     History   Chief Complaint Chief Complaint  Patient presents with  . Palpitations  . Nausea    HPI [place] Kalee Broxton is a 30 y.o. female.  Level V caveat for urgent need for intervention. Patient is currently inpatient at behavioral health for polysubstance abuse, depression, suicidal ideation. This morning she noted significant palpitations and tachycardia. Patient has history of atrial fibrillation with multiple episodes of cardioversion and 1 episodes of ablation. She has been on Cardizem, but has not been taking it for the past several days. She has also been on digoxin in the past. No substernal chest pain, diaphoresis, nausea.      Past Medical History:  Diagnosis Date  . Asthma   . Atrial fibrillation (HCC)    LV function lower limit of normal   . Bipolar disorder (HCC)   . Depression   . GERD (gastroesophageal reflux disease)   . Left against medical advice 12/12/13  . Polysubstance dependence including opioid type drug, continuous use (HCC)    prior heroin overdose    Patient Active Problem List   Diagnosis Date Noted  . Major depressive disorder, recurrent severe without psychotic features (HCC) 03/27/2016  . Opiate dependence (HCC) 03/27/2016  . Substance induced mood disorder (HCC) 03/27/2016  . 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 Cellulitis 06/02/2014  . IV drug user 06/02/2014  . Abscess of right arm 06/02/2014  . Left against medical advice 12/14/2013  . Atrial fibrillation with RVR (HCC) 12/11/2013  . Atrial fibrillation with rapid ventricular response (HCC) 12/11/2013  . Overdose of heroin 10/19/2012  . POST-OP CARDIAC COMPLICATION 07/20/2009  . SYNCOPE 11/30/2008  . CARDIOMYOPATHY, SECONDARY 05/17/2008  . Atrial fibrillation (HCC) 05/17/2008  . Asthma 03/12/2008  . COUGH 03/12/2008    Past Surgical History:  Procedure  Laterality Date  . catheter ablation     she reports prior ablatio (More than 5 years ago) by Dr Rudolpho Sevin in [facility]    OB History    Gravida Para Term Preterm AB Living   5 2   2 3 2    SAB TAB Ectopic Multiple Live Births   1 2     2        Home Medications    Prior to Admission medications   Medication Sig Start Date End Date Taking? Authorizing Provider  albuterol (PROVENTIL HFA;VENTOLIN HFA) 108 (90 BASE) MCG/ACT inhaler Inhale 1-2 puffs into the lungs every 6 (six) hours as needed for wheezing or shortness of breath.    Historical Provider, MD  albuterol (PROVENTIL) (2.5 MG/3ML) 0.083% nebulizer solution Inhale 2.5 mg into the lungs every 6 (six) hours as needed for shortness of breath or wheezing.    Historical Provider, MD  diltiazem (CARDIZEM CD) 120 MG 24 hr capsule Take 120 mg by mouth daily.    Historical Provider, MD  ondansetron (ZOFRAN ODT) 4 MG disintegrating tablet Take 1 tablet (4 mg total) by mouth every 8 (eight) hours as needed for nausea or vomiting. Patient not taking: Reported on 03/27/2016 01/25/16   Tomasita Crumble, MD    Family History Family History  Problem Relation Age of Onset  . Asthma Brother   . Hypertension Mother   . Hypertension Maternal Grandmother     Social History Social History  Substance Use Topics  . Smoking status: Current Every Day Smoker    Packs/day: 0.50  Types: Cigarettes  . Smokeless tobacco: Current User     Comment: since 2002; smokes about 8 cig/day   . Alcohol use Yes     Comment: heavy on occasion     Allergies   Review of patient's allergies indicates no known allergies.   Review of Systems Review of Systems  All other systems reviewed and are negative.    Physical Exam Updated Vital Signs BP 129/92   Pulse (!) 48   Temp 98.6 F (37 C) (Oral)   Resp 24   Ht 5\' 6"  (1.676 m)   Wt 115 lb (52.2 kg)   SpO2 (!) 89%   BMI 18.56 kg/m   Physical Exam  Constitutional: She is oriented to person, place, and  time. She appears well-developed and well-nourished.  HENT:  Head: Normocephalic and atraumatic.  Eyes: Conjunctivae are normal.  Neck: Neck supple.  Cardiovascular:  Irregular rhythm  Pulmonary/Chest: Effort normal and breath sounds normal.  Abdominal: Soft. Bowel sounds are normal.  Musculoskeletal: Normal range of motion.  Neurological: She is alert and oriented to person, place, and time.  Skin: Skin is warm and dry.  Psychiatric: She has a normal mood and affect. Her behavior is normal.  Nursing note and vitals reviewed.    ED Treatments / Results  Labs (all labs ordered are listed, but only abnormal results are displayed) Labs Reviewed  LIPID PANEL - Abnormal; Notable for the following:       Result Value   HDL 28 (*)    All other components within normal limits  URINALYSIS, ROUTINE W REFLEX MICROSCOPIC (NOT AT  [facility]) - Abnormal; Notable for the following:    Color, Urine AMBER (*)    APPearance CLOUDY (*)    Leukocytes, UA TRACE (*)    All other components within normal limits  URINE MICROSCOPIC-ADD ON - Abnormal; Notable for the following:    Squamous Epithelial / LPF 0-5 (*)    Bacteria, UA FEW (*)    All other components within normal limits  GLUCOSE, CAPILLARY - Abnormal; Notable for the following:    Glucose-Capillary 119 (*)    All other components within normal limits  CBC WITH DIFFERENTIAL/PLATELET - Abnormal; Notable for the following:    RBC 5.73 (*)    Hemoglobin 18.5 (*)    HCT 53.2 (*)    All other components within normal limits  BASIC METABOLIC PANEL - Abnormal; Notable for the following:    Glucose, Bld 112 (*)    All other components within normal limits  HEMOGLOBIN A1C  TSH  PROLACTIN  PREGNANCY, URINE  TROPONIN I    EKG  EKG Interpretation  Date/Time:  Friday March 30 2016 08:06:57 EDT Ventricular Rate:  61 PR Interval:    QRS Duration: 97 QT Interval:  445 QTC Calculation: 449 R Axis:   76 Text Interpretation:  Sinus rhythm  Confirmed by Rahcel Shutes  MD, Avyn Coate ([id number]) on 03/30/2016 10:12:44 AM       Radiology No results found.  Procedures Procedures (including critical care time)  Medications Ordered in ED Medications  albuterol (PROVENTIL HFA;VENTOLIN HFA) 108 (90 Base) MCG/ACT inhaler 1-2 puff (not administered)  dicyclomine (BENTYL) tablet 20 mg (20 mg Oral Given 03/28/16 2243)  loperamide (IMODIUM) capsule 2-4 mg (not administered)  methocarbamol (ROBAXIN) tablet 500 mg (500 mg Oral Given 03/29/16 2017)  naproxen (NAPROSYN) tablet 500 mg (500 mg Oral Given 03/29/16 2017)  ondansetron (ZOFRAN-ODT) disintegrating tablet 4 mg (4 mg Oral Given 03/30/16 0205)  cloNIDine (  CATAPRES) tablet 0.1 mg (0.1 mg Oral Given 03/30/16 0920)    Followed by  cloNIDine (CATAPRES) tablet 0.1 mg (not administered)    Followed by  cloNIDine (CATAPRES) tablet 0.1 mg (not administered)  acetaminophen (TYLENOL) tablet 650 mg (650 mg Oral Given 03/29/16 0813)  alum & mag hydroxide-simeth (MAALOX/MYLANTA) 200-200-20 MG/5ML suspension 30 mL (not administered)  magnesium hydroxide (MILK OF MAGNESIA) suspension 30 mL (not administered)  traZODone (DESYREL) tablet 50 mg (50 mg Oral Not Given 03/29/16 2300)  nicotine (NICODERM CQ - dosed in mg/24 hours) patch 21 mg (21 mg Transdermal Patch Removed 03/30/16 0924)  lidocaine (LIDODERM) 5 % 1 patch (1 patch Transdermal Patch Removed 03/29/16 2011)  QUEtiapine (SEROQUEL) tablet 50 mg (50 mg Oral Given 03/29/16 2236)  LORazepam (ATIVAN) tablet 1 mg (1 mg Oral Given 03/30/16 0657)  hydrOXYzine (ATARAX/VISTARIL) tablet 50 mg (50 mg Oral Given 03/30/16 0205)  aspirin chewable tablet 81 mg (81 mg Oral Given 03/30/16 0920)  ondansetron (ZOFRAN) injection 4 mg (4 mg Intravenous Given 03/30/16 0943)  clonazePAM (KLONOPIN) tablet 1 mg (1 mg Oral Given 03/30/16 1009)     Initial Impression / Assessment and Plan / ED Course  I have reviewed the triage vital signs and the nursing notes.  Pertinent  labs & imaging results that were available during my care of the patient were reviewed by me and considered in my medical decision making (see chart for details).  Clinical Course    Complex patient with history of atrial fibrillation. She appears to be in sinus rhythm now with a sinus arrhythmia. Will consult cardiology.  Cardiology consult recommends resuming Cardizem CD 120 mg daily.  Final Clinical Impressions(s) / ED Diagnoses   Final diagnoses:  Palpitations    New Prescriptions New Prescriptions   No medications on file     Donnetta Hutching, MD 03/30/16 1147    Donnetta Hutching, MD 03/30/16 [phone]

## 2016-03-30 NOTE — Discharge Instructions (Signed)
Call atrial fibrillation clinic for appointment for early next week at [phone].  Cardiologist recommends resuming Cardizem CD 120 mg daily

## 2016-03-30 NOTE — Progress Notes (Signed)
Data.[facility]. [facility], she is NSR and without distress. Patient reports pain of lower back upon entering the unit and received her late lidocaine patch. She denies NV. She also denies SI/HI/AVH. She is able to verbally contract for safety on the unit and to come to staff prior to acting on any self harm thoughts/feelings. The orders from the ED are for her to start the Cardizem after she goes to the A-Fib clinic [facility]. She [facility].  Action. Emotional support and encouragement offered. Education provided on medication, indications and side effect. Q 15 minute checks done for safety. Response. Safety on the unit maintained through 15 minute checks.  Medications taken as prescribed.Remained calm and appropriate through out remainder of the shift.

## 2016-03-30 NOTE — Progress Notes (Signed)
Patient ID: [name], female   DOB: Sep 30, 1985, 30 y.o.   MRN: [id number]  Pt reports to writer increased anxiety. Pt states "I am going to go into afib and pass out on you. I can feel my heart beating out of my chest." Pt supported emotionally and encouraged to express concerns. Pt reports increased anxiety about vomiting, states "when I throw up it makes my afib worse." [name], NP notified, order to send to the ED for evaluation. Pt vitals monitored, given 1mg  ativan po, pt repositioned. Pt reports "I need to just go to sleep so my heart will be better." ED charge nurse given report. Attempted to contact patients family, no answer. Information given to oncoming nurse. [facility]

## 2016-03-30 NOTE — Progress Notes (Signed)
Patient ID: Judith LovelessVictoria Lorraine Issac, female   DOB: 11/20/1985, 30 y.o.   MRN: [id number]  Pt reports increased anxiety. Pt states that she felt "panicky." Vitals BP: 128/93, P 50, 20RR, O2 100% on room air. Pt given as needed medication for anxiety. Pt in bed with eyes closed currently.

## 2016-03-30 NOTE — ED Notes (Addendum)
Pt requesting klonopin for anxiety, MD made aware.

## 2016-03-30 NOTE — ED Triage Notes (Signed)
[facility]. [facility]. Pt. States had N/V last night. Pt. With hx of afib, had ablation approx 8 years ago. Pt. States hasnt had cardizem x 6 days. Started feeling her "heart racing" this AM. EMS gave 4 mg zofran in route. Pt. Had afib with 121 HR in route, on arrival pt. NSR HR 65.
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
## 2016-03-31 DIAGNOSIS — F17210 Nicotine dependence, cigarettes, uncomplicated: Secondary | ICD-10-CM

## 2016-03-31 DIAGNOSIS — Z825 Family history of asthma and other chronic lower respiratory diseases: Secondary | ICD-10-CM

## 2016-03-31 DIAGNOSIS — Z8249 Family history of ischemic heart disease and other diseases of the circulatory system: Secondary | ICD-10-CM

## 2016-03-31 MED ORDER — ASPIRIN 81 MG PO CHEW
81.0000 mg | CHEWABLE_TABLET | Freq: Every day | ORAL | 0 refills | Status: AC
Start: 2016-04-01 — End: ?

## 2016-03-31 MED ORDER — DILTIAZEM HCL ER COATED BEADS 120 MG PO CP24
120.0000 mg | ORAL_CAPSULE | Freq: Every day | ORAL | Status: DC
  Administered 2016-03-31: 120 mg via ORAL
  Filled 2016-03-31 (×3): qty 1

## 2016-03-31 MED ORDER — NICOTINE 21 MG/24HR TD PT24: 21.0000 mg | MEDICATED_PATCH | Freq: Every day | TRANSDERMAL | 0 refills | Status: AC

## 2016-03-31 MED ORDER — TRAZODONE HCL 50 MG PO TABS: 50.0000 mg | ORAL_TABLET | Freq: Every evening | ORAL | 0 refills | Status: AC | PRN

## 2016-03-31 MED ORDER — HYDROXYZINE HCL 50 MG PO TABS: 50.0000 mg | ORAL_TABLET | Freq: Four times a day (QID) | ORAL | 0 refills | Status: AC | PRN

## 2016-03-31 MED ORDER — QUETIAPINE FUMARATE 50 MG PO TABS: 50.0000 mg | ORAL_TABLET | Freq: Every day | ORAL | 0 refills | Status: AC

## 2016-03-31 MED ORDER — LIDOCAINE 5 % EX PTCH: 1.0000 | MEDICATED_PATCH | Freq: Every day | CUTANEOUS | 0 refills | Status: AC

## 2016-03-31 MED ORDER — DILTIAZEM HCL ER COATED BEADS 120 MG PO CP24: 120.0000 mg | ORAL_CAPSULE | Freq: Every day | ORAL | 0 refills | Status: DC

## 2016-03-31 MED ORDER — DILTIAZEM HCL ER COATED BEADS 120 MG PO CP24
ORAL_CAPSULE | ORAL | Status: AC
  Filled 2016-03-31: qty 1
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
## 2016-03-31 NOTE — Progress Notes (Signed)
  [facility] Dis[facility] :  Will you be returning to the same living situation after discharge:  Yes,  home with mother At discharge, do you have transportation home?: Yes,  has a ride Do you have the ability to pay for your medications: No.  Release of information consent forms completed and in the chart;  Patient's signature needed at discharge.  Patient to Follow up at: Follow-up Information    [facility].   Specialty:  Behavioral Health Why:  Walk in between 8am-9am Monday through Friday for hospital follow-up/medication management/assessment for counseling services.  Contact information: [address] [place] [address] [phone]        Carlyle LipaINKER,DORRIS, PhD.   Specialty:  Psychology Contact information: [phone] [place] [address] [phone]        Rudi CocoARROLL,DONNA, NP. Go on 04/02/2016.   Specialties:  Nurse Practitioner, Cardiology Why:  [facility] @ 2:30pm Contact information: [address] [place] [address] [phone]           Next level of care provider [facility] Link:no  Safety Planning and Suicide Prevention discussed: Yes,  with patient and her mother  Have you used any form of tobacco in the last 30 days? (Cigarettes, Smokeless Tobacco, Cigars, and/or Pipes): Yes  Has patient been referred to the Quitline?: Patient refused referral  Patient has been referred for addiction treatment: Yes, with outpatient provider(s)  Lynnell ChadMareida J Grossman-Orr 03/31/2016, 1:36 PM

## 2016-03-31 NOTE — Progress Notes (Signed)
D    Pt continues to endorse anxiety and depression   She interacts frequently with other patients and is observed in the dayroom laughing and talking   She is compliant with treatment and is appropriate in her behavior A    Verbal support given    Medications administered and effectiveness monitored   Q 15 min checks R    Pt is safe at present and receptive to verbal support

## 2016-03-31 NOTE — BHH Group Notes (Signed)
B[facility] Group Notes:  (Nursing/MHT/Case Management/Adjunct)  Date:  03/31/2016  Time:  4:33 PM  Type of Therapy:  Nurse Education  Participation Level:  Active  Participation Quality:  Appropriate  Affect:  Appropriate  Cognitive:  Appropriate  Insight:  Appropriate  Engagement in Group:  Engaged  Modes of Intervention:  Discussion and Problem-solving  Summary of Progress/Problems: patient was able to identify 5 positive traits about herself.   Almira Barenny G Kemesha Mosey 03/31/2016, 4:33 PM

## 2016-03-31 NOTE — BHH Group Notes (Signed)
Identifying Needs   Date:  03/31/2016  Time:  0915  Type of Therapy:  Education  :  Identifying  Needs:  The group is focused on teaching patients how to identify their needs as a coping skilll.  Participation Level:  Good  Participation Quality:  Good  Affect:  Flat  Cognitive:  Intact  Insight:  Good   Engagement in Group:  Engaged  Modes of Intervention:  Discussion /  Education  Summary of Progress/Problems:  Rich BraveDuke, Loriene Taunton Lynn 03/31/2016, 12:34 PM

## 2016-03-31 NOTE — Progress Notes (Signed)
Consulted by psychiatry physician to evaluate patient for tachycardia, it appears patient is back in A. fib with RVR, heart rate in 110's range, has seen by cardiology yesterday, with plan for outpatient follow-up this coming Monday, patient on Cardizem CD at home with good control at baseline, has been on hold during hospital stay getting soft blood pressure as she is on clonidine to prevent withdrawals, discussed with Dr. Doy MinceHeisada, at this point Coumadin can be stopped, so I will stop her clonidine, and resume her back on Cardizem CD 120 mg oral daily for better heart rate control, patient instructed to follow with cardiology appointment on Monday, she already has prescription from cardiology for Cardizem CD. Huey Bienenstockawood Georgann Bramble MD

## 2016-03-31 NOTE — Progress Notes (Signed)
Data. Patient denies SI/HI/AVH. While standing at the nurses station, patient reported that her, "Heart is beating out of my chest". She became lightheaded and swayed on the spot and started to crumple up. She did remain A & O x4.  Nurse and tech assisted  patient back to her room. VS taken. BP 111/72 and pulse 161. MD notified. MD assessed patient and then consulted the hospitalist MD. EKG completed per order, and results on the chart. Hospitalist MD consulted with patient and new medication orders written. Patient requested discharge, "I need to be able to just rest. These halls are long and I just want to lay down and watch TV without all these people around". On her self assessment patient reported, 0/10 for depression, anxiety and hopelessness. Her goal for today is: "Getting back on my prescription benzo to calm my nerves, to calm my heart". Action. Emotional support and encouragement offered. Education provided on medication, indications and side effect. Q 15 minute checks done for safety. Response. Safety on the unit maintained through 15 minute checks.  Medications taken as prescribed. Attended groups. Remained calm and appropriate through out shift.  Pt. discharged to lobby and the care of her mother..  Belongings sheet reviewed and signed by pt. and all belongings, [facility] and [facility], sent home. Paperwork reviewed and pt. able to verbalize understanding of education. Pt. in no current distress and ambulatory.

## 2016-03-31 NOTE — [facility] Group Notes (Signed)
  [facility] LCSW Group Therapy Note  03/31/2016 and 10:00 AM  Type of Therapy and Topic:  Group Therapy: Avoiding Self-Sabotaging and Enabling Behaviors  Participation Level:  Active  Participation Quality:  Monopolizing  Affect:  Anxious  Cognitive:  Alert and Oriented  Insight:  Developing/Improving  Engagement in Therapy:  Engaged   Therapeutic models used: Cognitive Behavioral Therapy,  Person-Centered Therapy and Motivational Interviewing  Modes of Intervention:  Discussion, Exploration, Orientation, Rapport Building, Socialization and Support   Summary of Progress/Problems:  The main focus of today's process group was for the patient to identify ways in which they have in the past sabotaged their own recovery. Motivational Interviewing was utilized to identify motivation they may have for wanting to change. The Stages of Change were explained using a handout, and patients identified where they currently are with regard to stages of change. Patient reports she is in action stage of staying clean. She also processed how helpful is has been to detox safely while being inpatient verses at home on her own.    Carney Bernatherine C Harrill, LCSW

## 2016-04-02 ENCOUNTER — Ambulatory Visit (HOSPITAL_COMMUNITY)
Admission: RE | Admit: 2016-04-02 | Discharge: 2016-04-02 | Disposition: A | Discharge status: Home / Self Care | Payer: Self-pay | Source: Ambulatory Visit | Attending: Nurse Practitioner | Admitting: Nurse Practitioner

## 2016-04-02 ENCOUNTER — Encounter (HOSPITAL_COMMUNITY): Payer: Self-pay | Admitting: Nurse Practitioner

## 2016-04-02 VITALS — BP 112/84 | HR 96 | Ht 66.0 in

## 2016-04-02 DIAGNOSIS — F17210 Nicotine dependence, cigarettes, uncomplicated: Secondary | ICD-10-CM | POA: Insufficient documentation

## 2016-04-02 DIAGNOSIS — F319 Bipolar disorder, unspecified: Secondary | ICD-10-CM | POA: Insufficient documentation

## 2016-04-02 DIAGNOSIS — Z7982 Long term (current) use of aspirin: Secondary | ICD-10-CM | POA: Insufficient documentation

## 2016-04-02 DIAGNOSIS — I480 Paroxysmal atrial fibrillation: Secondary | ICD-10-CM | POA: Insufficient documentation

## 2016-04-02 DIAGNOSIS — Z8249 Family history of ischemic heart disease and other diseases of the circulatory system: Secondary | ICD-10-CM | POA: Insufficient documentation

## 2016-04-02 DIAGNOSIS — K219 Gastro-esophageal reflux disease without esophagitis: Secondary | ICD-10-CM | POA: Insufficient documentation

## 2016-04-02 MED ORDER — DILTIAZEM HCL ER 120 MG PO CP24: 120.0000 mg | ORAL_CAPSULE | Freq: Every day | ORAL | 6 refills | Status: AC

## 2016-04-02 NOTE — Progress Notes (Addendum)
Primary Care Physician: No PCP Per Patient Referring Physician[facility]  EP: Dr. Garvin FilaKlein   [name] is a 30 y.o. female with a h/o PAF in the afib clinic for f/u.She had recently admitted Behavior medicine unit for heroin detox.she has been using a gram to a gram and a half a day of heroin IV. She reports using heroin for about 3 or 4 years prior to that started using pain pills that were prescribed for kidney stones and low back pain originally. She does endorse tolerance, withdrawal, giving up things per use, use when dangerous, and craving. She states she has tried to cut back and has been clean for about 2 months at one point in about 4 months at another. She reports that she does drink 4-5 shots of liquor a day and a beer or 2 a day but that she does not feel like she has to drink every day and itnot is pressing to get alcohol as it is to get heroin. She states that when she stops drinking she has felt some vomiting and shaking in the past but denies any history of complicated withdrawal. She states she used to drink a fifth to 3/5 a day of alcohol with a fianc who passed away 11/24/15 after he fell and hit his head and since that time she has cut back.  She smokes about half a pack a day of cigarettes and denies regular use of any other substances. She has suffered from anxiety for many years and first began experiencing panic attacks at age 30. She states she became so severe that she had to have medicine by age 30 she reports that her symptoms usually occur "in order". It starts with sweating hands and then increased heart rate increased shortness of breath occasionally it will progress to shivering and teeth chattering and she says she always feels like "I'm going todie".  She has been diagnosed with afib for years and states had "ablation" for "tachycardia" [place] [place] [facility] when she was in early 20's.. She has a chadsvasc of 1(female) and is not on anticoagulants.  She has had cardioversion's over the years and has been on anticoagulant(coumadin) during these times. Also was on sotalol at one point when she was carrying her second child, [facility]. At one point she was on sotalol, metoprolol and digoxin. She also describes unexplained passing out spells, usually when she is standing and sounds possibly like POTS. She currently says that she is clean of heroin and she hopes to stay clean. She is out of her cardizem due to brand being sent to drugstore and not being able to afford. Generic will be sent in today. Afib has been more frequent in last few days probably secondary to infrequent CCB use. She has reports that she is facing 30 day jail time in the next week for DUI last year. She would like to get reestablished with Dr. Graciela HusbandsKlein that followed her several years ago.  Today, she denies symptoms of palpitations, chest pain, shortness of breath, orthopnea, PND, lower extremity edema, dizziness, presyncope, syncope, or neurologic sequela. The patient is tolerating medications without difficulties and is otherwise without complaint today.   Past Medical History:  Diagnosis Date  . Asthma   . Atrial fibrillation (HCC)    LV function lower limit of normal   . Bipolar disorder (HCC)   . Depression   . GERD (gastroesophageal reflux disease)   .  Left against medical advice 12/12/13  . Polysubstance dependence including opioid type drug, continuous use (HCC)    prior heroin overdose   Past Surgical History:  Procedure Laterality Date  . catheter ablation     she reports prior ablatio (More than 5 years ago) by Dr Rudolpho Sevin in [facility]    Current Outpatient Prescriptions  Medication Sig Dispense Refill  . albuterol (PROVENTIL HFA;VENTOLIN HFA) 108 (90 BASE) MCG/ACT inhaler Inhale 1-2 puffs into the lungs every 6 (six) hours as needed for wheezing or shortness of breath.    Marland Kitchen albuterol (PROVENTIL) (2.5 MG/3ML) 0.083% nebulizer  solution Inhale 2.5 mg into the lungs every 6 (six) hours as needed for shortness of breath or wheezing.    Marland Kitchen aspirin 81 MG chewable tablet Chew 1 tablet (81 mg total) by mouth daily. 30 tablet 0  . hydrOXYzine (ATARAX/VISTARIL) 50 MG tablet Take 1 tablet (50 mg total) by mouth every 6 (six) hours as needed for anxiety. 60 tablet 0  . lidocaine (LIDODERM) 5 % Place 1 patch onto the skin daily. Remove & Discard patch within 12 hours or as directed by MD 30 patch 0  . nicotine (NICODERM CQ - DOSED IN MG/24 HOURS) 21 mg/24hr patch Place 1 patch (21 mg total) onto the skin daily. 28 patch 0  . QUEtiapine (SEROQUEL) 50 MG tablet Take 1 tablet (50 mg total) by mouth at bedtime. 30 tablet 0  . traZODone (DESYREL) 50 MG tablet Take 1 tablet (50 mg total) by mouth at bedtime as needed for sleep. 30 tablet 0  . diltiazem (DILACOR XR) 120 MG 24 hr capsule Take 1 capsule (120 mg total) by mouth daily. 30 capsule 6   No current facility-administered medications for this encounter.     No Known Allergies  Social History   Social History  . Marital status: Single    Spouse name: N/A  . Number of children: N/A  . Years of education: N/A   Occupational History  . Not on file.   Social History Main Topics  . Smoking status: Current Every Day Smoker    Packs/day: 0.50    Types: Cigarettes  . Smokeless tobacco: Current User     Comment: since 2002; smokes about 8 cig/day   . Alcohol use Yes     Comment: heavy on occasion  . Drug use:      Comment: heroin  . Sexual activity: Yes    Birth control/ protection: Condom   Other Topics Concern  . Not on file   Social History Narrative   Single; children; Insurance underwriter.    Lives in [place] with 2 young daughters    Family History  Problem Relation Age of Onset  . Asthma Brother   . Hypertension Mother   . Hypertension Maternal Grandmother     ROS- All systems are reviewed and negative except as per the HPI above  Physical  Exam: Vitals:   04/02/16 1434  BP: 112/84  Pulse: 96  Height: 5\' 6"  (1.676 m)    GEN- The patient is well appearing, alert and oriented x 3 today.   Head- normocephalic, atraumatic Eyes-  Sclera clear, conjunctiva pink Ears- hearing intact Oropharynx- clear Neck- supple, no JVP Lymph- no cervical lymphadenopathy Lungs- Clear to ausculation bilaterally, normal work of breathing Heart- Regular rate and rhythm, no murmurs, rubs or gallops, PMI not laterally displaced GI- soft, NT, ND, + BS Extremities- no clubbing, cyanosis, or edema MS- no significant deformity or atrophy Skin-  no rash or lesion Psych- euthymic mood, full affect Neuro- strength and sensation are intact  EKG- SR with marked sinus arrhythmia, v rate 96 bpm, pr int 126 ms, qrs int 74 ms, qtc 457 ms Epic records reviewed  Assessment and Plan: 1. Paroxysmal afib In the setting of drug, alcohol and tobacco abuse Lifestyle discussed and how contributes to afib burden She hopes to stay off drugs and is working on tobacco use Chadsvasc score of 1(female) and does not need anticoagulants by guidelines Generic diltiazem 120 mg qd was sent to drug store so she will be able to afford, brand initially ordered on d/c from the hospital.  Will refer to Dr. Graciela HusbandsKlein to get reestablished  after her jail time(6-8 weeks) is served and evaluate her symptoms of "passing out spells" when standing ?  POTS Afib clinic as needed  Elvina SidleDonna C. Matthew Folksarroll, ANP-C Afib Clinic [facility] [address] [place], [place] [address] [phone]

## 2016-05-09 ENCOUNTER — Encounter: Payer: Self-pay | Admitting: Internal Medicine

## 2016-05-15 ENCOUNTER — Encounter: Payer: Self-pay | Admitting: Internal Medicine

## 2016-05-16 ENCOUNTER — Encounter: Payer: Self-pay | Admitting: Internal Medicine

## 2016-11-02 DEATH — deceased
